# Patient Record
Sex: Male | Born: 1935 | Race: White | Hispanic: No | Marital: Married | Smoking: Never smoker
Health system: Southern US, Community
[De-identification: ages and names within clinical notes are randomized; demographics above are authoritative.]

## PROBLEM LIST (undated history)

## (undated) DIAGNOSIS — T7840XA Allergy, unspecified, initial encounter: Secondary | ICD-10-CM

## (undated) DIAGNOSIS — I10 Essential (primary) hypertension: Secondary | ICD-10-CM

## (undated) DIAGNOSIS — R972 Elevated prostate specific antigen [PSA]: Secondary | ICD-10-CM

## (undated) DIAGNOSIS — I639 Cerebral infarction, unspecified: Secondary | ICD-10-CM

## (undated) DIAGNOSIS — E785 Hyperlipidemia, unspecified: Secondary | ICD-10-CM

## (undated) DIAGNOSIS — N400 Enlarged prostate without lower urinary tract symptoms: Secondary | ICD-10-CM

## (undated) DIAGNOSIS — Z974 Presence of external hearing-aid: Secondary | ICD-10-CM

## (undated) DIAGNOSIS — M1990 Unspecified osteoarthritis, unspecified site: Secondary | ICD-10-CM

## (undated) HISTORY — PX: PROSTATE BIOPSY: SHX241

## (undated) HISTORY — DX: Allergy, unspecified, initial encounter: T78.40XA

## (undated) HISTORY — PX: TONSILLECTOMY: SUR1361

## (undated) HISTORY — DX: Essential (primary) hypertension: I10

## (undated) HISTORY — DX: Elevated prostate specific antigen (PSA): R97.20

## (undated) HISTORY — DX: Cerebral infarction, unspecified: I63.9

## (undated) HISTORY — DX: Hyperlipidemia, unspecified: E78.5

---

## 1998-04-25 ENCOUNTER — Ambulatory Visit (HOSPITAL_COMMUNITY): Admission: RE | Admit: 1998-04-25 | Discharge: 1998-04-25 | Payer: Self-pay | Admitting: Family Medicine

## 2002-06-20 ENCOUNTER — Ambulatory Visit (HOSPITAL_COMMUNITY): Admission: RE | Admit: 2002-06-20 | Discharge: 2002-06-20 | Payer: Self-pay | Admitting: *Deleted

## 2002-06-20 ENCOUNTER — Encounter (INDEPENDENT_AMBULATORY_CARE_PROVIDER_SITE_OTHER): Payer: Self-pay | Admitting: Specialist

## 2004-07-10 ENCOUNTER — Encounter: Admission: RE | Admit: 2004-07-10 | Discharge: 2004-07-10 | Payer: Self-pay | Admitting: Family Medicine

## 2008-11-02 LAB — CONVERTED CEMR LAB: PSA: 5.4 ng/mL

## 2008-11-12 ENCOUNTER — Encounter: Payer: Self-pay | Admitting: Family Medicine

## 2009-06-14 LAB — CONVERTED CEMR LAB: PSA: 5.8 ng/mL

## 2009-09-17 ENCOUNTER — Telehealth: Payer: Self-pay | Admitting: Family Medicine

## 2009-11-04 ENCOUNTER — Ambulatory Visit: Payer: Self-pay | Admitting: Family Medicine

## 2009-11-04 DIAGNOSIS — E785 Hyperlipidemia, unspecified: Secondary | ICD-10-CM | POA: Insufficient documentation

## 2009-11-04 DIAGNOSIS — E559 Vitamin D deficiency, unspecified: Secondary | ICD-10-CM | POA: Insufficient documentation

## 2009-11-04 DIAGNOSIS — J45909 Unspecified asthma, uncomplicated: Secondary | ICD-10-CM | POA: Insufficient documentation

## 2009-11-04 DIAGNOSIS — I10 Essential (primary) hypertension: Secondary | ICD-10-CM | POA: Insufficient documentation

## 2009-11-04 DIAGNOSIS — J309 Allergic rhinitis, unspecified: Secondary | ICD-10-CM | POA: Insufficient documentation

## 2009-11-05 ENCOUNTER — Ambulatory Visit: Payer: Self-pay | Admitting: Family Medicine

## 2009-11-05 LAB — CONVERTED CEMR LAB: Vit D, 25-Hydroxy: 42 ng/mL (ref 30–89)

## 2009-11-06 LAB — CONVERTED CEMR LAB
ALT: 25 units/L (ref 0–53)
AST: 23 units/L (ref 0–37)
Albumin: 4.4 g/dL (ref 3.5–5.2)
Alkaline Phosphatase: 51 units/L (ref 39–117)
BUN: 20 mg/dL (ref 6–23)
Bilirubin, Direct: 0.2 mg/dL (ref 0.0–0.3)
CO2: 28 meq/L (ref 19–32)
Calcium: 9.9 mg/dL (ref 8.4–10.5)
Chloride: 104 meq/L (ref 96–112)
Cholesterol: 168 mg/dL (ref 0–200)
Creatinine, Ser: 0.9 mg/dL (ref 0.4–1.5)
GFR calc non Af Amer: 86.82 mL/min (ref >60)
Glucose, Bld: 112 mg/dL — ABNORMAL HIGH (ref 70–99)
HDL: 68.2 mg/dL (ref >39.00)
LDL Cholesterol: 86 mg/dL (ref 0–99)
Potassium: 5 meq/L (ref 3.5–5.1)
Sodium: 143 meq/L (ref 135–145)
Total Bilirubin: 1.2 mg/dL (ref 0.3–1.2)
Total CHOL/HDL Ratio: 2
Total Protein: 7 g/dL (ref 6.0–8.3)
Triglycerides: 69 mg/dL (ref 0.0–149.0)
VLDL: 13.8 mg/dL (ref 0.0–40.0)

## 2010-04-30 ENCOUNTER — Ambulatory Visit: Payer: Self-pay | Admitting: Family Medicine

## 2010-04-30 DIAGNOSIS — J209 Acute bronchitis, unspecified: Secondary | ICD-10-CM | POA: Insufficient documentation

## 2010-06-04 ENCOUNTER — Encounter: Payer: Self-pay | Admitting: Family Medicine

## 2010-07-29 NOTE — Assessment & Plan Note (Signed)
Summary: NEW MEDICARE/[facility]   Vital Signs:  Patient profile:   76 year old male Height:      68 inches Weight:      166.50 pounds BMI:     25.41 Temp:     98.6 degrees F oral Pulse rate:   80 / minute Pulse rhythm:   regular BP sitting:   144 / 74  (left arm) Cuff size:   regular  Vitals Entered By: Delilah Shan CMA Duncan Dull) 2009/12/03 2:02 PM) CC: New Medicare Patient   History of Present Illness: 76 yo male here to establish care.    HTN- has been on Amlodipine 10 mg dan Quinipril 10 mg for years (was previously on lower dose of amlodipine).  Does get white coat HTN so brings in home BP log today.  BPs at home range 115-140/73-78.  No CP, SOB, blurred vision.  Gets occasional trace edema in his legs since increased dose of amlodipine but not bothersome.  HLD- has been on Simvastain 40 mg for years.  Last checked 10/2008- LDL 100, TG 131, HDL 59.  Normal LFTs.  No myalgias.  Elevated PSA- followed by Dr. Laverle Patter.  Has had two biopsies that were both neg.  PSA has been trending down over past year, last checked last  December, was 5.8.  Has another appt with him soon.  Allergic rhinitis/asthma- seasonal allergies.  Takes Claritin and Flonase.  During spring, takes Flovent and albuterol as needed.  Never has issues with wheezing or coughing when seasonal allergies are not bothering him.  Vit D deficiency- last May Vit D was 23.  Has been taking 1000 Units Vit D daily.    Preventive Screening-Counseling & Management  Alcohol-Tobacco     Smoking Status: never  Caffeine-Diet-Exercise     Does Patient Exercise: yes      Drug Use:  no.    Current Medications (verified): 1)  Proventil Hfa 108 (90 Base) Mcg/act Aers (Albuterol Sulfate) .... Inhale 2 Puffs Four Times A Day As Needed. 2)  Simvastatin 40 Mg Tabs (Simvastatin) .... Take 1 Tablet By Mouth Once A Day 3)  Amlodipine Besylate 10 Mg Tabs (Amlodipine Besylate) .... Take 1 Tablet By Mouth Once A Day 4)  Quinapril Hcl 10 Mg  Tabs (Quinapril Hcl) .... Take 1 Tablet By Mouth Once A Day 5)  Flonase 50 Mcg/act Susp (Fluticasone Propionate) .... (During Allergy Season).  Take 2 Sprays Each Nostril Once Daily 6)  Calcium Carbonate-Vitamin D 600-400 Mg-Unit  Tabs (Calcium Carbonate-Vitamin D) .Marland Kitchen.. 1200 Mg. Once Daily 7)  Vitamin D 1000 Unit  Tabs (Cholecalciferol) .... Take 1 Tablet By Mouth Once A Day 8)  Multivitamins   Tabs (Multiple Vitamin) .... Take 1 Tablet By Mouth Once A Day 9)  Fish Oil   Oil (Fish Oil) .Marland Kitchen.. 1000 Mg. Once Daily 10)  Aspirin 81 Mg  Tabs (Aspirin) .... Weekly 11)  Claritin 10 Mg Tabs (Loratadine) .... As Needed 12)  Pseudoephedrine Hcl 30 Mg Tabs (Pseudoephedrine Hcl) .... As Needed 13)  Benadryl 25 Mg Tabs (Diphenhydramine Hcl) .... When Claritin Doesn't Work 14)  Flovent Hfa 44 Mcg/act Aero (Fluticasone Propionate  Hfa) .... As Needed  Allergies (verified): 1)  ! Penicillin  Past History:  Family History: Last updated: 2009/12/03 Dad died at 39- massive MI mom had stroke later in life  Social History: Last updated: 03-Dec-2009 Lives in [place].   Retired as Theatre manager. Never Smoked Alcohol use-occassional Drug use-no Regular exercise-yes One  biological son, one adopted son, two grand daughter, 48 and 61 yo  Risk Factors: Exercise: yes (11/04/2009)  Risk Factors: Smoking Status: never (11/04/2009)  Past Medical History: Allergic rhinitis Asthma Hyperlipidemia Hypertension Elevated PSA s/p neg biopsy  Past Surgical History: Tonsillectomy Prostate biopsy  Family History: Dad died at 63- massive MI mom had stroke later in life  Social History: Lives in [place].   Retired as Theatre manager. Never Smoked Alcohol use-occassional Drug use-no Regular exercise-yes One biological son, one adopted son, two grand daughter, 47 and 57 yo Smoking Status:  never Drug Use:  no Does Patient Exercise:  yes  Review of Systems      See HPI General:  Denies  loss of appetite and malaise. Eyes:  Denies blurring. ENT:  Denies difficulty swallowing. CV:  Denies chest pain or discomfort. Resp:  Denies shortness of breath. GI:  Denies abdominal pain, bloody stools, and change in bowel habits. GU:  Denies dysuria, incontinence, nocturia, urinary frequency, and urinary hesitancy. MS:  Denies joint pain, joint redness, and joint swelling. Derm:  Denies rash. Neuro:  Denies headaches. Psych:  Denies anxiety and depression. Endo:  Denies cold intolerance, heat intolerance, polyuria, and weight change. Heme:  Denies abnormal bruising and bleeding. Allergy:  Complains of seasonal allergies.  Physical Exam  General:  Well-developed,well-nourished,in no acute distress; alert,appropriate and cooperative throughout examination Head:  Normocephalic and atraumatic without obvious abnormalities. No apparent alopecia or balding. Eyes:  vision grossly intact, pupils equal, pupils round, and pupils reactive to light.   Ears:  R ear normal and L ear normal.   Nose:  no external deformity.   Mouth:  MMM Lungs:  Normal respiratory effort, chest expands symmetrically. Lungs are clear to auscultation, no crackles or wheezes. Heart:  Normal rate and regular rhythm. S1 and S2 normal without gallop, murmur, click, rub or other extra sounds. Abdomen:  Bowel sounds positive,abdomen soft and non-tender without masses, organomegaly or hernias noted. Extremities:  no edema Skin:  seborrheic keratosis.   Psych:  memory intact for recent and remote and normally interactive.     Impression & Recommendations:  Problem # 1:  HYPERLIPIDEMIA (ICD-272.4) Assessment Unchanged Recheck fasting lipids and LFTs.  Continue Simvastatin at current dose. His updated medication list for this problem includes:    Simvastatin 40 Mg Tabs (Simvastatin) .Marland Kitchen... Take 1 tablet by mouth once a day  Problem # 2:  ALLERGIC RHINITIS (ICD-477.9) Assessment: Unchanged Continue current meds. His  updated medication list for this problem includes:    Flonase 50 Mcg/act Susp (Fluticasone propionate) ..... (during allergy season).  take 2 sprays each nostril once daily    Claritin 10 Mg Tabs (Loratadine) .Marland Kitchen... As needed    Pseudoephedrine Hcl 30 Mg Tabs (Pseudoephedrine hcl) .Marland Kitchen... As needed    Benadryl 25 Mg Tabs (Diphenhydramine hcl) .Marland Kitchen... When claritin doesn't work  Problem # 3:  HYPERTENSION (ICD-401.9) Assessment: Unchanged Stable.  Continue current meds. His updated medication list for this problem includes:    Amlodipine Besylate 10 Mg Tabs (Amlodipine besylate) .Marland Kitchen... Take 1 tablet by mouth once a day    Quinapril Hcl 10 Mg Tabs (Quinapril hcl) .Marland Kitchen... Take 1 tablet by mouth once a day  Orders: Prescription Created Electronically [phone])  Problem # 4:  VITAMIN D DEFICIENCY (ICD-268.9) Assessment: Unchanged Recheck Vit D level.  Continue 1000 units daily.  Complete Medication List: 1)  Proventil Hfa 108 (90 Base) Mcg/act Aers (Albuterol sulfate) .... Inhale 2 puffs four times a day  as needed. 2)  Simvastatin 40 Mg Tabs (Simvastatin) .... Take 1 tablet by mouth once a day 3)  Amlodipine Besylate 10 Mg Tabs (Amlodipine besylate) .... Take 1 tablet by mouth once a day 4)  Quinapril Hcl 10 Mg Tabs (Quinapril hcl) .... Take 1 tablet by mouth once a day 5)  Flonase 50 Mcg/act Susp (Fluticasone propionate) .... (during allergy season).  take 2 sprays each nostril once daily 6)  Calcium Carbonate-vitamin D 600-400 Mg-unit Tabs (Calcium carbonate-vitamin d) .Marland Kitchen.. 1200 mg. once daily 7)  Vitamin D 1000 Unit Tabs (Cholecalciferol) .... Take 1 tablet by mouth once a day 8)  Multivitamins Tabs (Multiple vitamin) .... Take 1 tablet by mouth once a day 9)  Fish Oil Oil (Fish oil) .Marland Kitchen.. 1000 mg. once daily 10)  Aspirin 81 Mg Tabs (Aspirin) .... Weekly 11)  Claritin 10 Mg Tabs (Loratadine) .... As needed 12)  Pseudoephedrine Hcl 30 Mg Tabs (Pseudoephedrine hcl) .... As needed 13)  Benadryl 25  Mg Tabs (Diphenhydramine hcl) .... When claritin doesn't work 14)  Flovent Hfa 44 Mcg/act Aero (Fluticasone propionate  hfa) .... As needed  Patient Instructions: 1)  Great to meet you, Mr. Solly 2)  Please make an appointment for a lab visit tomorrow morning, BMET, Vit D (268.9), Fasting lipid, liver function (272.4). Prescriptions: SIMVASTATIN 40 MG TABS (SIMVASTATIN) Take 1 tablet by mouth once a day  #90 x 6   Entered and Authorized by:   Ruthe Mannan MD   Signed by:   Ruthe Mannan MD on 11/04/2009   Method used:   [facility]        [facility] (retail)       [address]       [place], [place]  [address]       Ph: [phone] or [phone]       Fax: [phone]   RxID:   [id number] QUINAPRIL HCL 10 MG TABS (QUINAPRIL HCL) Take 1 tablet by mouth once a day  #90 x 6   Entered and Authorized by:   Ruthe Mannan MD   Signed by:   Ruthe Mannan MD on 11/04/2009   Method used:   [facility]        [facility] (retail)       [address]       [place], [place]  [address]       Ph: [phone] or [phone]       Fax: [phone]   RxID:   [id number] AMLODIPINE BESYLATE 10 MG TABS (AMLODIPINE BESYLATE) Take 1 tablet by mouth once a day  #90 x 6   Entered and Authorized by:   Ruthe Mannan MD   Signed by:   Ruthe Mannan MD on 11/04/2009   Method used:   [facility]        [facility] (retail)       [address]       [place], [place]  [address]       Ph: [phone] or [phone]       Fax: [phone]   RxID:   [id number]   Current Allergies (reviewed today): ! PENICILLIN  TD Result Date:  10/31/2008 TD Result:  historical Pneumovax Result Date:  09/07/2007 Pneumovax Result:  historical Herpes Zoster Result Date:  09/07/2007 Herpes Zoster Result:  historical HDL Result Date:  11/02/2008 HDL Next Due:  1 yr LDL Result Date:  11/02/2008 LDL Next Due:  1 yr Flex  Sig Next Due:  Not  Indicated Colonoscopy Result Date:  11/10/2002 Colonoscopy Result:  normal Colonoscopy Next Due:  10 yr Hemoccult Next Due:  Not Indicated PSA Result Date:  06/14/2009 PSA Result:  5.8 PSA Next Due:  1 yr

## 2010-07-29 NOTE — Assessment & Plan Note (Signed)
Summary: ASTHMA/[facility]   Vital Signs:  Patient profile:   76 year old male Weight:      170.25 pounds O2 Sat:      98 % on Room air Temp:     97.7 degrees F oral Pulse rate:   76 / minute Pulse rhythm:   regular BP sitting:   160 / 90  (left arm) Cuff size:   regular  Vitals Entered By: Selena Batten Dance CMA Duncan Dull) (April 30, 2010 9:34 AM)  O2 Flow:  Room air CC: Asthma/cough   History of Present Illness: CC: bronchitis/asthma x 3 wks  seasonal allergies cause bronchitis which triggers asthma.  2nd time this year.  controls allergies with claritin and flonase.  Didn't help this time.  For last 3 wks cough productive of clear mucous worse at night, trouble sleeping.  using albuterol.  has had flovent but hasn't been using.  + congestion, RN, sneezing, itchy eyes.  asthma only problem during allergy season.  No fevers/chills, ST, abd pain, na/v, rashes, muscle pains.  no recent travel outside of city.  h/o asthma x 3 years.  was runner.  never smoked.  o/w healthy.  Last spring did prednisone taper which helped as well.  Also has tussionex pennkinetic but wanted to check first to make sure ok to take.  was in [employer], had PFTs years ago and always told had good lung function.  asthma has only started being problem in last 3 years.  Current Medications (verified): 1)  Proventil Hfa 108 (90 Base) Mcg/act Aers (Albuterol Sulfate) .... Inhale 2 Puffs Four Times A Day As Needed. 2)  Simvastatin 40 Mg Tabs (Simvastatin) .... Take 1 Tablet By Mouth Once A Day 3)  Amlodipine Besylate 10 Mg Tabs (Amlodipine Besylate) .... Take 1 Tablet By Mouth Once A Day 4)  Quinapril Hcl 10 Mg Tabs (Quinapril Hcl) .... Take 1 Tablet By Mouth Once A Day 5)  Flonase 50 Mcg/act Susp (Fluticasone Propionate) .... (During Allergy Season).  Take 2 Sprays Each Nostril Once Daily 6)  Calcium Carbonate-Vitamin D 600-400 Mg-Unit  Tabs (Calcium Carbonate-Vitamin D) .Marland Kitchen.. 1200 Mg. Once Daily 7)  Vitamin D 1000 Unit   Tabs (Cholecalciferol) .... Take 1 Tablet By Mouth Once A Day 8)  Multivitamins   Tabs (Multiple Vitamin) .... Take 1 Tablet By Mouth Once A Day 9)  Fish Oil   Oil (Fish Oil) .Marland Kitchen.. 1000 Mg. Once Daily 10)  Aspirin 81 Mg  Tabs (Aspirin) .... Weekly 11)  Claritin 10 Mg Tabs (Loratadine) .... As Needed 12)  Pseudoephedrine Hcl 30 Mg Tabs (Pseudoephedrine Hcl) .... As Needed 13)  Benadryl 25 Mg Tabs (Diphenhydramine Hcl) .... When Claritin Doesn't Work 14)  Flovent Hfa 44 Mcg/act Aero (Fluticasone Propionate  Hfa) .... As Needed  Allergies: 1)  ! Penicillin  Past History:  Past Medical History: Last updated: 11/04/2009 Allergic rhinitis Asthma Hyperlipidemia Hypertension Elevated PSA s/p neg biopsy  Social History: Last updated: 11/04/2009 Lives in [place].   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  + episodes of coughing that takes breath away. Head:  Normocephalic and atraumatic without obvious abnormalities. No apparent alopecia or balding. Eyes:  vision grossly intact, pupils equal, pupils round, and pupils  reactive to light.   Ears:  R ear normal and L ear normal.   Nose:  no external deformity.   Mouth:  MMM Neck:  No deformities, masses, or tenderness noted. Lungs:  overall clear lungs, good air movement.  + wheezing but mainly expiratory with cough, sounds more upper airway transmission of cough.  good O2 sat. Heart:  Normal rate and regular rhythm. S1 and S2 normal without gallop, murmur, click, rub or other extra sounds. Pulses:  2+ rad pulses Extremities:  no edema   Impression & Recommendations:  Problem # 1:  ACUTE BRONCHITIS (ICD-466.0)  treat as asthmatic  bronchitis with zpack and steroids.  red flags to return discussed.  start flovent regularly after predinsone course.  His updated medication list for this problem includes:    Proventil Hfa 108 (90 Base) Mcg/act Aers (Albuterol sulfate) ..... Inhale 2 puffs four times a day as needed.    Flovent Hfa 44 Mcg/act Aero (Fluticasone propionate  hfa) .Marland Kitchen... As needed    Zithromax Z-pak 250 Mg Tabs (Azithromycin) .Marland Kitchen... Take as directed  Orders: Prescription Created Electronically [id number])  Complete Medication List: 1)  Proventil Hfa 108 (90 Base) Mcg/act Aers (Albuterol sulfate) .... Inhale 2 puffs four times a day as needed. 2)  Simvastatin 40 Mg Tabs (Simvastatin) .... Take 1 tablet by mouth once a day 3)  Amlodipine Besylate 10 Mg Tabs (Amlodipine besylate) .... Take 1 tablet by mouth once a day 4)  Quinapril Hcl 10 Mg Tabs (Quinapril hcl) .... Take 1 tablet by mouth once a day 5)  Flonase 50 Mcg/act Susp (Fluticasone propionate) .... (during allergy season).  take 2 sprays each nostril once daily 6)  Calcium Carbonate-vitamin D 600-400 Mg-unit Tabs (Calcium carbonate-vitamin d) .Marland Kitchen.. 1200 mg. once daily 7)  Vitamin D 1000 Unit Tabs (Cholecalciferol) .... Take 1 tablet by mouth once a day 8)  Multivitamins Tabs (Multiple vitamin) .... Take 1 tablet by mouth once a day 9)  Fish Oil Oil (Fish oil) .Marland Kitchen.. 1000 mg. once daily 10)  Aspirin 81 Mg Tabs (Aspirin) .... Weekly 11)  Claritin 10 Mg Tabs (Loratadine) .... As needed 12)  Pseudoephedrine Hcl 30 Mg Tabs (Pseudoephedrine hcl) .... As needed 13)  Benadryl 25 Mg Tabs (Diphenhydramine hcl) .... When claritin doesn't work 14)  Flovent Hfa 44 Mcg/act Aero (Fluticasone propionate  hfa) .... As needed 15)  Zithromax Z-pak 250 Mg Tabs (Azithromycin) .... Take as directed 16)  Prednisone 20 Mg Tabs (Prednisone) .... 2 pills for 7 days  Patient Instructions: 1)  Sounds like you have a touch of asthmatic bronchitis. 2)  Take zpack, steroid course.  Start  flovent after steroids. 3)  Push fluids and plenty of rest. 4)  Let us know if not improving as expected. 5)  Please return if you are not improving as expected, or if you have high fevers (>101.5) or difficulty swallowing. 6)  Call clinic with questions.  Pleasure to see you today!  Prescriptions: FLOVENT HFA 44 MCG/ACT AERO (FLUTICASONE PROPIONATE  HFA) as needed  #1 x 3   Entered and Authorized by:   Eustaquio Boyden  MD   Signed by:   Eustaquio Boyden  MD on 04/30/2010   Method used:   [facility]        [facility] (retail)       [address]       [place], [place]  [address]       Ph: [phone] or [phone]  Fax: [phone]   RxID:   [id number] PREDNISONE 20 MG TABS (PREDNISONE) 2 pills for 7 days  #14 x 0   Entered and Authorized by:   Eustaquio Boyden  MD   Signed by:   Eustaquio Boyden  MD on 04/30/2010   Method used:   [facility]        [facility] ([facility]       [address]       [place], [place]  [address]       Ph: [phone] or [phone]       Fax: [phone]   RxID:   [id number] ZITHROMAX Z-PAK 250 MG TABS (AZITHROMYCIN) take as directed  #1 x 0   Entered and Authorized by:   Eustaquio Boyden  MD   Signed by:   Eustaquio Boyden  MD on 04/30/2010   Method used:   [facility]        [id number]       [address]       [place], [place]  [address]       Ph: [phone] or [phone]       Fax: [phone]   RxID:   [id number]    Orders Added: 1)  Est. Patient Level III [[address]] 2)  Prescription Created Electronically [phone]    Current Allergies (reviewed today): ! PENICILLIN

## 2010-07-29 NOTE — Progress Notes (Signed)
Summary: pt requests proventil  Phone Note Call from Patient   Caller: Patient Summary of Call: Pt walked in, he has a new pt appt with Dr. Dayton Martes in May.  States he had an asthma attack last night, barely had enough proventil to get him through it.  His previous doctor has closed his practice and he cant get any refills, asks if we can refill.  Per Dr. Dayton Martes, ok to refill.  Called in one inhaler with [facility] ref[facility].  Med added to med list. Initial call taken by: Lowella Petties CMA,  September 17, 2009 9:01 AM    New/Updated Medications: PROVENTIL HFA 108 (90 BASE) MCG/ACT AERS (ALBUTEROL SULFATE) inhale 2 puffs four times a day as needed. Prescriptions: PROVENTIL HFA 108 (90 BASE) MCG/ACT AERS (ALBUTEROL SULFATE) inhale 2 puffs four times a day as needed.  #1 x 1   Entered by:   Lowella Petties CMA   Authorized by:   Ruthe Mannan MD   Signed by:   Lowella Petties CMA on 09/17/2009   Method used:   Telephoned to ...       [facility] [facility]ail[facility]       [address]       [place], [place]  [address]       Ph: [phone] or [phone]       Fax: [phone]   RxID:   [id number]   Prior Medications:

## 2010-07-29 NOTE — Letter (Signed)
Summary: Dr.James Kindl's Records  Dr[facility]James Kindl[facility] Records   Imported By: Beau Fanny 11/05/2009 08:44:05  _____________________________________________________________________  External Attachment:    Type:   Image     Comment:   External Document

## 2010-07-31 NOTE — Letter (Signed)
Summary: [facility]  [facility]   Imported By: Lanelle Bal 06/13/2010 08:28:08  _____________________________________________________________________  External Attachment:    Type:   Image     Comment:   External Document  Appended Document: [facility] elevated, stable PSA.  annual follow up.

## 2010-11-14 NOTE — Op Note (Signed)
   NAMEJENTZEN, MINASYAN NO.:  [id number]   MEDICAL RECORD NO.:  [id number]                   PATIENT TYPE:  AMB   LOCATION:  ENDO                                 FACILITY:  [facility]   PHYSICIAN:  Georgiana Spinner, M.D.                 DATE OF BIRTH:  06/08/1935   DATE OF PROCEDURE:  06/20/2002  DATE OF DISCHARGE:                                 OPERATIVE REPORT   PROCEDURE:  Upper endoscopy with biopsy.   INDICATIONS FOR PROCEDURE:  Hemoccult positivity.   ANESTHESIA:  Demerol 70, Versed 7 mg.   PROCEDURE:  With the patient mildly sedated in the left lateral decubitus  position, the Olympus videoscopic endoscope was inserted in the mouth and  passed under direct vision through the esophagus which appeared normal, into  the stomach.  In the fundus of the stomach, there was a long superficial  linear ulcer presumably NSAID induced which we photographed and subsequently  biopsied.  We advanced to the body, antrum, duodenal bulb, and second  portion of the duodenum all appeared normal.  From this point, the endoscope  was slowly withdrawn taking several views of the duodenal mucosa until the  endoscope was pulled back into the stomach and placed in retroflexion to  view the stomach from below.  Slight laxity of the GE junction on the  endoscope was noted and photographed in one view.  The endoscope was then  straightened taking views of the remaining gastric and esophageal mucosa.  The patient's vital signs, pulse oximeter remained stable.  The patient  tolerated the procedure well without apparent complications.   FINDINGS:  Linear ulcer in fundus of stomach presumably NSAID induced.   PLAN:  Will discuss with patient either switching from his OTC NSAIDs, of  staying on his regular dose of aspirin, or alternatively adding a PPI for  gastric coverage.  Proceed with colonoscopy as planned.                                               Georgiana Spinner,  M.D.    GMO/MEDQ  D:  06/20/2002  T:  06/20/2002  Job:  [id number]   cc:   Quita Skye. Artis Flock, M.D.  [address]  [place]  [place] [address]  Fax: [phone]

## 2010-11-14 NOTE — Op Note (Signed)
   NAMETARVIS, [name]:  [id number]   MEDICAL RECORD NO.:  [id number]                   PATIENT TYPE:  AMB   LOCATION:  ENDO                                 FACILITY:  [facility]   PHYSICIAN:  Georgiana Spinner, M.D.                 DATE OF BIRTH:  [birth year]   DATE OF PROCEDURE:  06/20/2002  DATE OF DISCHARGE:                                 OPERATIVE REPORT   PROCEDURE:  Colonoscopy.   INDICATIONS FOR PROCEDURE:  Hemoccult positivity.   ANESTHESIA:  Demerol 30, Versed 3 mg.   PROCEDURE:  With the patient mildly sedated in the left lateral decubitus  position, a rectal exam was performed which was unremarkable.  Subsequently,  the Olympus videoscopic colonoscope was inserted in the rectum and passed  under direct vision to the cecum identified by the ileocecal valve and  appendiceal orifice, both of which were photographed.  Going to the very tip  of the terminal ileum which appeared normal.  From this point, the  colonoscope was slowly withdrawn taking views of the entire colonic mucosa  stopping only in the rectum which appeared normal on direct and retroflex  view.  The endoscope was withdrawn.  The patient's vital signs, pulse  oximeter remained stable.  The patient tolerated the procedure well without  apparent complications.   FINDINGS:  Unremarkable colonoscopic examination to the cecum.   PLAN:  See endoscopy note for further details.                                               Georgiana Spinner, M.D.    GMO/MEDQ  D:  06/20/2002  T:  06/20/2002  Job:  [id number]   cc:   Quita Skye. Artis Flock, M.D.  [address]  [place]  [place] [address]  Fax: [phone]

## 2010-11-14 NOTE — Op Note (Signed)
   [name], Elijah NO.:  [id number]   MEDICAL RECORD NO.:  [id number]                   PATIENT TYPE:  AMB   LOCATION:  ENDO                                 FACILITY:  [facility]   PHYSICIAN:  Georgiana Spinner, M.D.                 DATE OF BIRTH:  01-04-1935   DATE OF PROCEDURE:  06/20/2002  DATE OF DISCHARGE:                                 OPERATIVE REPORT   PROCEDURE:  Upper endoscopy with biopsy.   INDICATIONS FOR PROCEDURE:  Hemoccult positivity.   ANESTHESIA:  Demerol 70, Versed 7 mg.   PROCEDURE:  With the patient mildly sedated in the left lateral decubitus  position, the Olympus videoscopic endoscope was inserted in the mouth and  passed under direct vision through the esophagus which appeared normal, into  the stomach.  In the fundus of the stomach, there was a long superficial  linear ulcer, presumably NSAID induced, which we photographed and  subsequently biopsied.  We advanced to the body, antrum, duodenal bulb, and  second portion of the duodenum which all appeared normal.  From this point,  the endoscope was slowly withdrawn taking several views of the duodenal  mucosa until the endoscope was pulled back into the stomach and placed in  retroflexion to view the stomach from below and slight laxity of the GE  junction on the endoscope was noted and photographed in one view.  The  endoscope was straightened and withdrawn, taking several views of the  remaining gastric and esophageal mucosa.  The patient's vital signs, pulse  oximeter remained stable.  The patient tolerated the procedure well without  apparent complications.   FINDINGS:  Linear ulcer in the fundus of the stomach presumably NSAID  induced.   PLAN:  Will discuss with the patient either switching from his OTC NSAIDs,  staying on his regular dose of aspirin, alternatively adding a PPI for  gastric coverage.  Proceed to colonoscopy as planned.                     Georgiana Spinner, M.D.    GMO/MEDQ  D:  06/20/2002  T:  06/20/2002  Job:  [id number]   cc:   Quita Skye. Artis Flock, M.D.  [address], [address]  [place]  [place] [address]  Fax: [phone]

## 2010-12-12 ENCOUNTER — Other Ambulatory Visit: Payer: Self-pay | Admitting: Family Medicine

## 2011-01-13 ENCOUNTER — Other Ambulatory Visit: Payer: Self-pay | Admitting: Family Medicine

## 2011-01-13 DIAGNOSIS — E785 Hyperlipidemia, unspecified: Secondary | ICD-10-CM

## 2011-01-13 DIAGNOSIS — E559 Vitamin D deficiency, unspecified: Secondary | ICD-10-CM

## 2011-01-13 DIAGNOSIS — I10 Essential (primary) hypertension: Secondary | ICD-10-CM

## 2011-01-14 ENCOUNTER — Other Ambulatory Visit: Payer: Self-pay | Admitting: *Deleted

## 2011-01-14 MED ORDER — SIMVASTATIN 40 MG PO TABS: 40.0000 mg | ORAL_TABLET | Freq: Every day | ORAL | Status: DC

## 2011-01-14 MED ORDER — QUINAPRIL HCL 10 MG PO TABS: 10.0000 mg | ORAL_TABLET | Freq: Every day | ORAL | Status: DC

## 2011-01-15 ENCOUNTER — Other Ambulatory Visit (INDEPENDENT_AMBULATORY_CARE_PROVIDER_SITE_OTHER): Payer: Medicare Other | Admitting: Family Medicine

## 2011-01-15 ENCOUNTER — Encounter: Payer: Self-pay | Admitting: Family Medicine

## 2011-01-15 DIAGNOSIS — I10 Essential (primary) hypertension: Secondary | ICD-10-CM

## 2011-01-15 DIAGNOSIS — E785 Hyperlipidemia, unspecified: Secondary | ICD-10-CM

## 2011-01-15 LAB — LIPID PANEL
Cholesterol: 159 mg/dL (ref 0–200)
HDL: 66.9 mg/dL (ref >39.00)
LDL Cholesterol: 78 mg/dL (ref 0–99)
Total CHOL/HDL Ratio: 2
Triglycerides: 73 mg/dL (ref 0.0–149.0)
VLDL: 14.6 mg/dL (ref 0.0–40.0)

## 2011-01-15 LAB — BASIC METABOLIC PANEL
BUN: 15 mg/dL (ref 6–23)
CO2: 25 mEq/L (ref 19–32)
Calcium: 9.6 mg/dL (ref 8.4–10.5)
Chloride: 107 mEq/L (ref 96–112)
Creatinine, Ser: 1 mg/dL (ref 0.4–1.5)
GFR: 80.39 mL/min (ref >60.00)
Glucose, Bld: 121 mg/dL — ABNORMAL HIGH (ref 70–99)
Potassium: 4.6 mEq/L (ref 3.5–5.1)
Sodium: 142 mEq/L (ref 135–145)

## 2011-01-15 LAB — HM COLONOSCOPY

## 2011-01-20 ENCOUNTER — Ambulatory Visit (INDEPENDENT_AMBULATORY_CARE_PROVIDER_SITE_OTHER): Payer: Medicare Other | Admitting: Family Medicine

## 2011-01-20 ENCOUNTER — Encounter: Payer: Self-pay | Admitting: Family Medicine

## 2011-01-20 VITALS — BP 150/100 | HR 79 | Temp 98.0°F | Ht 66.25 in | Wt 166.5 lb

## 2011-01-20 DIAGNOSIS — R739 Hyperglycemia, unspecified: Secondary | ICD-10-CM

## 2011-01-20 DIAGNOSIS — Z0000 Encounter for general adult medical examination without abnormal findings: Secondary | ICD-10-CM | POA: Insufficient documentation

## 2011-01-20 DIAGNOSIS — J309 Allergic rhinitis, unspecified: Secondary | ICD-10-CM

## 2011-01-20 DIAGNOSIS — R7309 Other abnormal glucose: Secondary | ICD-10-CM

## 2011-01-20 DIAGNOSIS — E785 Hyperlipidemia, unspecified: Secondary | ICD-10-CM

## 2011-01-20 DIAGNOSIS — J45909 Unspecified asthma, uncomplicated: Secondary | ICD-10-CM

## 2011-01-20 LAB — HEMOGLOBIN A1C: Hgb A1c MFr Bld: 5.5 % (ref 4.6–6.5)

## 2011-01-20 MED ORDER — SIMVASTATIN 40 MG PO TABS: ORAL_TABLET | ORAL | Status: DC

## 2011-01-20 NOTE — Patient Instructions (Signed)
Great to see you. Let's cut your simvastatin in half.  Try cutting back on starches. Have a great time at the beach.

## 2011-01-20 NOTE — Progress Notes (Signed)
76 [name] here for medicare annual wellness visit.  I have personally reviewed the Medicare Annual Wellness questionnaire and have noted 1. The patient's medical and social history 2. Their use of alcohol, tobacco or illicit drugs 3. Their current medications and supplements 4. The patient's functional ability including ADL's, fall risks, home safety risks and hearing or visual             impairment. 5. Diet and physical activities 6. Evidence for depression or mood disorders  Hyperglycemia-  CBG elevated to 121 this month.  Pt was fasting. Denies any family h/o DM.   Denies any increased thirst or urination. Does admit to eating a lot of starches- loves breads and pastas.  HTN- has been on Amlodipine 10 mg dan Quinipril 10 mg for years (was previously on lower dose of amlodipine).  Does get white coat HTN so brings in home BP log today.  BPs at home range 117-144/73-78.  No CP, SOB, blurred vision.  Gets occasional trace edema in his legs since increased dose of amlodipine but not bothersome.  HLD- has been on Simvastain 40 mg for years.   Lab Results  Component Value Date   CHOL 159 01/15/2011   CHOL 168 11/05/2009   Lab Results  Component Value Date   HDL 66.90 01/15/2011   HDL 16.10 11/05/2009   Lab Results  Component Value Date   LDLCALC 78 01/15/2011   LDLCALC 86 11/05/2009   Lab Results  Component Value Date   TRIG 73.0 01/15/2011   TRIG 69.0 11/05/2009    Elevated PSA- followed by Dr. Laverle Patter.  Has had two biopsies that were both neg.  PSA has been trending down over past year, last checked last  December, was 5.8.  Has another appt with him soon.  . Patient Active Problem List  Diagnoses  . VITAMIN D DEFICIENCY  . HYPERLIPIDEMIA  . HYPERTENSION  . ACUTE BRONCHITIS  . ALLERGIC RHINITIS  . ASTHMA  . Routine general medical examination at a health care facility   Past Medical History  Diagnosis Date  . Allergy   . Asthma   . Hyperlipidemia   . Hypertension   .  Elevated PSA     s/p neg biopsy   Past Surgical History  Procedure Date  . Tonsillectomy   . Prostate biopsy    History  Substance Use Topics  . Smoking status: Never Smoker   . Smokeless tobacco: Not on file  . Alcohol Use: Yes   Family History  Problem Relation Age of Onset  . Stroke Mother    Allergies  Allergen Reactions  . Penicillins    Current Outpatient Prescriptions on File Prior to Visit  Medication Sig Dispense Refill  . albuterol (PROVENTIL HFA) 108 (90 BASE) MCG/ACT inhaler Inhale 2 puffs into the lungs every 4 (four) hours as needed.        Marland Kitchen amLODipine (NORVASC) 10 MG tablet TAKE 1 TABLET BY MOUTH ONCE A DAY  90 tablet  3  . aspirin 81 MG tablet Take 81 mg by mouth daily.        . Calcium Carbonate-Vitamin D (TH CALCIUM CARBONATE-VITAMIN D) 600-400 MG-UNIT per tablet Take 1 tablet by mouth daily.        . cholecalciferol (VITAMIN D) 1000 UNITS tablet Take 1,000 Units by mouth daily.        . diphenhydrAMINE (BENADRYL) 25 MG tablet Take 25 mg by mouth as needed.        Marland Kitchen  fluticasone (FLONASE) 50 MCG/ACT nasal spray Place 2 sprays into the nose daily.        . fluticasone (FLOVENT HFA) 44 MCG/ACT inhaler Inhale 1 puff into the lungs as needed.        . loratadine (CLARITIN) 10 MG tablet Take 10 mg by mouth daily.        . Multiple Vitamin (MULTIVITAMIN) tablet Take 1 tablet by mouth daily.        . Omega-3 Fatty Acids (FISH OIL) 1000 MG CAPS Take 1 capsule by mouth daily.        . pseudoephedrine (SUDAFED) 30 MG tablet Take 30 mg by mouth as needed.        . quinapril (ACCUPRIL) 10 MG tablet Take 1 tablet (10 mg total) by mouth daily.  90 tablet  3  . simvastatin (ZOCOR) 40 MG tablet Take 1 tablet (40 mg total) by mouth at bedtime.  90 tablet  3   The PMH, PSH, Social History, Family History, Medications, and allergies have been reviewed in [facility], and have been updated if relevant.   Review of Systems       See HPI General:  Denies loss of appetite and  malaise. Eyes:  Denies blurring. ENT:  Denies difficulty swallowing. CV:  Denies chest pain or discomfort. Resp:  Denies shortness of breath. GI:  Denies abdominal pain, bloody stools, and change in bowel habits. GU:  Denies dysuria, incontinence, nocturia, urinary frequency, and urinary hesitancy. MS:  Denies joint pain, joint redness, and joint swelling. Derm:  Denies rash. Neuro:  Denies headaches. Psych:  Denies anxiety and depression. Endo:  Denies cold intolerance, heat intolerance, polyuria, and weight change. Heme:  Denies abnormal bruising and bleeding.   Physical Exam BP 150/100  Pulse 79  Temp(Src) 98 F (36.7 C) (Oral)  Ht 5' 6.25" (1.683 m)  Wt 166 lb 8 oz (75.524 kg)  BMI 26.Elijah kg/m2  General:  Well-developed,well-nourished,in no acute distress; alert,appropriate and cooperative throughout examination Head:  Normocephalic and atraumatic without obvious abnormalities. No apparent alopecia or balding. Eyes:  vision grossly intact, pupils equal, pupils round, and pupils reactive to light.   Ears:  R ear normal and L ear normal.   Nose:  no external deformity.   Mouth:  MMM Lungs:  Normal respiratory effort, chest expands symmetrically. Lungs are clear to auscultation, no crackles or wheezes. Heart:  Normal rate and regular rhythm. S1 and S2 normal without gallop, murmur, click, rub or other extra sounds. Abdomen:  Bowel sounds positive,abdomen soft and non-tender without masses, organomegaly or hernias noted. Extremities:  no edema Skin:  seborrheic keratosis.   Psych:  memory intact for recent and remote and normally interactive.    Assessment and Plan: 1. Routine general medical examination at a health care facility  The patients weight, height, BMI and visual acuity have been recorded in the chart I have made referrals, counseling and provided education to the patient based review of the above and I have provided the pt with a written personalized care plan for  preventive services.    2. HYPERLIPIDEMIA  Improved.  Will decrease Simvastatin dose to 20 mg daily as he is taking amlodipine as well and lipid panel is excellent. The patient indicates understanding of these issues and agrees with the plan.    3. ASTHMA  Stable.   4. Hyperglycemia  HgB A1c   New.  Will check a1c today. Also counseled on diet- given copy of eat right diet. Will likely  recheck fasting CBGs in 3-6 months based on a1c results.

## 2011-01-29 ENCOUNTER — Other Ambulatory Visit: Payer: Medicare Other

## 2011-01-29 ENCOUNTER — Encounter: Payer: Self-pay | Admitting: *Deleted

## 2011-01-29 ENCOUNTER — Other Ambulatory Visit: Payer: Self-pay | Admitting: Family Medicine

## 2011-01-29 DIAGNOSIS — Z1211 Encounter for screening for malignant neoplasm of colon: Secondary | ICD-10-CM

## 2011-01-29 LAB — FECAL OCCULT BLOOD, IMMUNOCHEMICAL: Fecal Occult Bld: NEGATIVE

## 2011-03-26 ENCOUNTER — Other Ambulatory Visit: Payer: Self-pay | Admitting: Family Medicine

## 2011-07-10 DIAGNOSIS — R972 Elevated prostate specific antigen [PSA]: Secondary | ICD-10-CM | POA: Diagnosis not present

## 2011-07-15 DIAGNOSIS — N402 Nodular prostate without lower urinary tract symptoms: Secondary | ICD-10-CM | POA: Diagnosis not present

## 2011-07-15 DIAGNOSIS — R972 Elevated prostate specific antigen [PSA]: Secondary | ICD-10-CM | POA: Diagnosis not present

## 2011-08-26 ENCOUNTER — Other Ambulatory Visit: Payer: Self-pay | Admitting: Family Medicine

## 2011-08-26 NOTE — Telephone Encounter (Signed)
Will route to PCP 

## 2011-11-20 ENCOUNTER — Other Ambulatory Visit: Payer: Self-pay | Admitting: Family Medicine

## 2012-01-01 ENCOUNTER — Other Ambulatory Visit: Payer: Self-pay | Admitting: Family Medicine

## 2012-01-19 ENCOUNTER — Other Ambulatory Visit: Payer: Self-pay | Admitting: Family Medicine

## 2012-01-19 DIAGNOSIS — E785 Hyperlipidemia, unspecified: Secondary | ICD-10-CM

## 2012-01-19 DIAGNOSIS — Z125 Encounter for screening for malignant neoplasm of prostate: Secondary | ICD-10-CM

## 2012-01-19 DIAGNOSIS — I10 Essential (primary) hypertension: Secondary | ICD-10-CM

## 2012-01-25 ENCOUNTER — Other Ambulatory Visit (INDEPENDENT_AMBULATORY_CARE_PROVIDER_SITE_OTHER): Payer: Medicare Other

## 2012-01-25 DIAGNOSIS — E785 Hyperlipidemia, unspecified: Secondary | ICD-10-CM

## 2012-01-25 DIAGNOSIS — I10 Essential (primary) hypertension: Secondary | ICD-10-CM

## 2012-01-25 DIAGNOSIS — Z125 Encounter for screening for malignant neoplasm of prostate: Secondary | ICD-10-CM

## 2012-01-25 LAB — LIPID PANEL
Cholesterol: 179 mg/dL (ref 0–200)
HDL: 65.9 mg/dL (ref >39.00)
LDL Cholesterol: 92 mg/dL (ref 0–99)
Total CHOL/HDL Ratio: 3
Triglycerides: 106 mg/dL (ref 0.0–149.0)
VLDL: 21.2 mg/dL (ref 0.0–40.0)

## 2012-01-25 LAB — COMPREHENSIVE METABOLIC PANEL
ALT: 18 U/L (ref 0–53)
AST: 21 U/L (ref 0–37)
Albumin: 4.3 g/dL (ref 3.5–5.2)
Alkaline Phosphatase: 57 U/L (ref 39–117)
BUN: 17 mg/dL (ref 6–23)
CO2: 25 mEq/L (ref 19–32)
Calcium: 9.8 mg/dL (ref 8.4–10.5)
Chloride: 104 mEq/L (ref 96–112)
Creatinine, Ser: 0.9 mg/dL (ref 0.4–1.5)
GFR: 85.21 mL/min (ref >60.00)
Glucose, Bld: 112 mg/dL — ABNORMAL HIGH (ref 70–99)
Potassium: 4.6 mEq/L (ref 3.5–5.1)
Sodium: 136 mEq/L (ref 135–145)
Total Bilirubin: 1.6 mg/dL — ABNORMAL HIGH (ref 0.3–1.2)
Total Protein: 7.2 g/dL (ref 6.0–8.3)

## 2012-01-25 LAB — PSA: PSA: 8.48 ng/mL — ABNORMAL HIGH (ref 0.10–4.00)

## 2012-01-28 ENCOUNTER — Ambulatory Visit (INDEPENDENT_AMBULATORY_CARE_PROVIDER_SITE_OTHER): Payer: Medicare Other | Admitting: Family Medicine

## 2012-01-28 ENCOUNTER — Ambulatory Visit (INDEPENDENT_AMBULATORY_CARE_PROVIDER_SITE_OTHER)
Admission: RE | Admit: 2012-01-28 | Discharge: 2012-01-28 | Disposition: A | Discharge status: Home / Self Care | Payer: Medicare Other | Source: Ambulatory Visit | Attending: Family Medicine | Admitting: Family Medicine

## 2012-01-28 ENCOUNTER — Encounter: Payer: Self-pay | Admitting: Family Medicine

## 2012-01-28 VITALS — BP 160/90 | HR 76 | Temp 98.1°F | Ht 66.0 in | Wt 161.0 lb

## 2012-01-28 DIAGNOSIS — R17 Unspecified jaundice: Secondary | ICD-10-CM | POA: Diagnosis not present

## 2012-01-28 DIAGNOSIS — Z0000 Encounter for general adult medical examination without abnormal findings: Secondary | ICD-10-CM | POA: Diagnosis not present

## 2012-01-28 DIAGNOSIS — J45909 Unspecified asthma, uncomplicated: Secondary | ICD-10-CM | POA: Diagnosis not present

## 2012-01-28 DIAGNOSIS — D239 Other benign neoplasm of skin, unspecified: Secondary | ICD-10-CM

## 2012-01-28 DIAGNOSIS — R05 Cough: Secondary | ICD-10-CM | POA: Diagnosis not present

## 2012-01-28 DIAGNOSIS — E785 Hyperlipidemia, unspecified: Secondary | ICD-10-CM | POA: Diagnosis not present

## 2012-01-28 DIAGNOSIS — I10 Essential (primary) hypertension: Secondary | ICD-10-CM | POA: Diagnosis not present

## 2012-01-28 DIAGNOSIS — R972 Elevated prostate specific antigen [PSA]: Secondary | ICD-10-CM

## 2012-01-28 DIAGNOSIS — D229 Melanocytic nevi, unspecified: Secondary | ICD-10-CM

## 2012-01-28 DIAGNOSIS — R059 Cough, unspecified: Secondary | ICD-10-CM | POA: Diagnosis not present

## 2012-01-28 NOTE — Progress Notes (Signed)
77 yo here for medicare annual wellness visit.  I have personally reviewed the Medicare Annual Wellness questionnaire and have noted 1. The patient's medical and social history 2. Their use of alcohol, tobacco or illicit drugs 3. Their current medications and supplements 4. The patient's functional ability including ADL's, fall risks, home safety risks and hearing or visual             impairment. 5. Diet and physical activities 6. Evidence for depression or mood disorders  Hyperglycemia-  CBG elevated to 112 this month but improved from last year. Pt was fasting. Denies any family h/o DM.   Denies any increased thirst or urination. Still eating quite a bit of pasta. Lab Results  Component Value Date   HGBA1C 5.5 01/20/2011     HTN- has been on Amlodipine 10 mg dan Quinipril 10 mg for years (was previously on lower dose of amlodipine).  Does get white coat HTN so brings in home BP log today.  BPs at home range 1121-1432/73-78.  No CP, SOB, blurred vision.  Gets occasional trace edema in his legs since increased dose of amlodipine but not bothersome.  HLD- decreased Simvastatin to 20 mg last year. Lab Results  Component Value Date   CHOL 179 01/25/2012   HDL 65.90 01/25/2012   LDLCALC 92 01/25/2012   TRIG 106.0 01/25/2012   CHOLHDL 3 01/25/2012      Elevated PSA- followed by Dr. Laverle Patter.  Has had two biopsies that were both neg.  PSA has increased since January. Lab Results  Component Value Date   PSA 8.48* 01/25/2012   PSA 5.8 06/14/2009   PSA 5.4 11/02/2008   Cough- has a h/o asthma.  Feels that it has been worse this year.  Has not restarted his flonase.  . Patient Active Problem List  Diagnosis  . VITAMIN D DEFICIENCY  . HYPERLIPIDEMIA  . HYPERTENSION  . ACUTE BRONCHITIS  . ALLERGIC RHINITIS  . ASTHMA  . Routine general medical examination at a health care facility  . Hyperglycemia   Past Medical History  Diagnosis Date  . Allergy   . Asthma   . Hyperlipidemia     . Hypertension   . Elevated PSA     s/p neg biopsy   Past Surgical History  Procedure Date  . Tonsillectomy   . Prostate biopsy    History  Substance Use Topics  . Smoking status: Never Smoker   . Smokeless tobacco: Not on file  . Alcohol Use: Yes   Family History  Problem Relation Age of Onset  . Stroke Mother    Allergies  Allergen Reactions  . Penicillins    Current Outpatient Prescriptions on File Prior to Visit  Medication Sig Dispense Refill  . amLODipine (NORVASC) 10 MG tablet TAKE 1 TABLET BY MOUTH ONCE A DAY  90 tablet  3  . aspirin 81 MG tablet Take 81 mg by mouth daily.        . Calcium Carbonate-Vitamin D (TH CALCIUM CARBONATE-VITAMIN D) 600-400 MG-UNIT per tablet Take 1 tablet by mouth daily.        . cholecalciferol (VITAMIN D) 1000 UNITS tablet Take 1,000 Units by mouth daily.        . diphenhydrAMINE (BENADRYL) 25 MG tablet Take 25 mg by mouth as needed.        Marland Kitchen FLOVENT HFA 44 MCG/ACT inhaler USE AS DIRECTED AS NEEDED  10.6 g  3  . fluticasone (FLONASE) 50 MCG/ACT nasal spray Place 2  sprays into the nose daily.        Marland Kitchen loratadine (CLARITIN) 10 MG tablet Take 10 mg by mouth daily.        . Multiple Vitamin (MULTIVITAMIN) tablet Take 1 tablet by mouth daily.        . Omega-3 Fatty Acids (FISH OIL) 1000 MG CAPS Take 1 capsule by mouth daily.        Marland Kitchen PROAIR HFA 108 (90 BASE) MCG/ACT inhaler USE 2 PUFFS 4 TIMES DAILY AS NEEDED  8.5 g  12  . pseudoephedrine (SUDAFED) 30 MG tablet Take 30 mg by mouth as needed.        . quinapril (ACCUPRIL) 10 MG tablet TAKE 1 TABLET (10 MG TOTAL) BY MOUTH DAILY.  90 tablet  0  . simvastatin (ZOCOR) 40 MG tablet 1/2 tablet by mouth daily.  90 tablet  3   The PMH, PSH, Social History, Family History, Medications, and allergies have been reviewed in [facility], and have been updated if relevant.   Review of Systems       See HPI General:  Denies loss of appetite and malaise. Eyes:  Denies blurring. ENT:  Denies difficulty  swallowing. CV:  Denies chest pain or discomfort. Resp:  Denies shortness of breath. GI:  Denies abdominal pain, bloody stools, and change in bowel habits. GU:  Denies dysuria, incontinence, nocturia, urinary frequency, and urinary hesitancy. MS:  Denies joint pain, joint redness, and joint swelling. Derm:  Denies rash. Neuro:  Denies headaches. Psych:  Denies anxiety and depression. Endo:  Denies cold intolerance, heat intolerance, polyuria, and weight change. Heme:  Denies abnormal bruising and bleeding.   Physical Exam BP 160/90  Pulse 76  Temp 98.1 F (36.7 C)  Ht 5\' 6"  (1.676 m)  Wt 161 lb (73.029 kg)  BMI 25.99 kg/m2 `  General:  Well-developed,well-nourished,in no acute distress; alert,appropriate and cooperative throughout examination Head:  Normocephalic and atraumatic without obvious abnormalities. No apparent alopecia or balding. Eyes:  vision grossly intact, pupils equal, pupils round, and pupils reactive to light.   Ears:  R ear normal and L ear normal.   Nose:  no external deformity.   Mouth:  MMM Lungs:  Normal respiratory effort, chest expands symmetrically. Lungs are clear to auscultation, no crackles or wheezes. Heart:  Normal rate and regular rhythm. S1 and S2 normal without gallop, murmur, click, rub or other extra sounds. Abdomen:  Bowel sounds positive,abdomen soft and non-tender without masses, organomegaly or hernias noted. Extremities:  no edema Skin:  seborrheic keratosis. One nevi in upper center back - darker than others.  Psych:  memory intact for recent and remote and normally interactive.    Assessment and Plan: 1. Routine general medical examination at a health care facility  The patients weight, height, BMI and visual acuity have been recorded in the chart I have made referrals, counseling and provided education to the patient based review of the above and I have provided the pt with a written personalized care plan for preventive services.      2. HYPERTENSION  Stable on current meds based on home log.   3. HYPERLIPIDEMIA  Stable on lower dose of simvastatin.   4. ASTHMA  Deteriorated this year.  Advised to restart flonase.  Get a CXR today. DG Chest 2 View  5. Elevated bilirubin  Recheck CMET in 4 weeks. Comprehensive metabolic panel  6. Multiple nevi  Derm referral- ?atypical mole. Ambulatory referral to Dermatology  7. Elevated PSA  Results  forwarded to urology.

## 2012-01-28 NOTE — Patient Instructions (Addendum)
Good to see you. Please stop by to see Shirlee Limerick on your way out to set up your dermatology appointment. I want to come back in 4 weeks for a lab recheck. We are getting a chest xray today- I will call you with those results.

## 2012-03-07 ENCOUNTER — Other Ambulatory Visit: Payer: Self-pay | Admitting: Family Medicine

## 2012-03-10 ENCOUNTER — Other Ambulatory Visit (INDEPENDENT_AMBULATORY_CARE_PROVIDER_SITE_OTHER): Payer: Medicare Other

## 2012-03-10 DIAGNOSIS — Z23 Encounter for immunization: Secondary | ICD-10-CM | POA: Diagnosis not present

## 2012-03-10 DIAGNOSIS — R17 Unspecified jaundice: Secondary | ICD-10-CM | POA: Diagnosis not present

## 2012-03-10 LAB — COMPREHENSIVE METABOLIC PANEL
ALT: 21 U/L (ref 0–53)
AST: 22 U/L (ref 0–37)
Albumin: 4 g/dL (ref 3.5–5.2)
Alkaline Phosphatase: 48 U/L (ref 39–117)
BUN: 16 mg/dL (ref 6–23)
CO2: 25 mEq/L (ref 19–32)
Calcium: 8.9 mg/dL (ref 8.4–10.5)
Chloride: 108 mEq/L (ref 96–112)
Creatinine, Ser: 1 mg/dL (ref 0.4–1.5)
GFR: 75.62 mL/min (ref >60.00)
Glucose, Bld: 112 mg/dL — ABNORMAL HIGH (ref 70–99)
Potassium: 4.1 mEq/L (ref 3.5–5.1)
Sodium: 141 mEq/L (ref 135–145)
Total Bilirubin: 1.1 mg/dL (ref 0.3–1.2)
Total Protein: 7.1 g/dL (ref 6.0–8.3)

## 2012-03-25 ENCOUNTER — Other Ambulatory Visit: Payer: Self-pay | Admitting: Family Medicine

## 2012-03-27 ENCOUNTER — Other Ambulatory Visit: Payer: Self-pay | Admitting: Family Medicine

## 2012-05-04 DIAGNOSIS — L570 Actinic keratosis: Secondary | ICD-10-CM | POA: Diagnosis not present

## 2012-05-04 DIAGNOSIS — L819 Disorder of pigmentation, unspecified: Secondary | ICD-10-CM | POA: Diagnosis not present

## 2012-05-04 DIAGNOSIS — L821 Other seborrheic keratosis: Secondary | ICD-10-CM | POA: Diagnosis not present

## 2012-06-07 ENCOUNTER — Other Ambulatory Visit: Payer: Self-pay | Admitting: Family Medicine

## 2012-06-15 DIAGNOSIS — H2510 Age-related nuclear cataract, unspecified eye: Secondary | ICD-10-CM | POA: Diagnosis not present

## 2012-07-07 ENCOUNTER — Other Ambulatory Visit: Payer: Self-pay

## 2012-07-07 MED ORDER — FLUTICASONE PROPIONATE 50 MCG/ACT NA SUSP: NASAL | Status: DC

## 2012-07-07 MED ORDER — ALBUTEROL SULFATE HFA 108 (90 BASE) MCG/ACT IN AERS: 2.0000 | INHALATION_SPRAY | RESPIRATORY_TRACT | Status: DC | PRN

## 2012-07-07 MED ORDER — AMLODIPINE BESYLATE 10 MG PO TABS: 10.0000 mg | ORAL_TABLET | Freq: Every day | ORAL | Status: DC

## 2012-07-07 MED ORDER — QUINAPRIL HCL 10 MG PO TABS: 10.0000 mg | ORAL_TABLET | Freq: Every day | ORAL | Status: DC

## 2012-07-07 NOTE — Telephone Encounter (Signed)
Pt left note changing pharmacy and needs written rx for amlodipine 10 mg, quinapril 10 mg, fluticasone and albuterol inhaler. Pt also request written rx for Sinmvastatin 20 mg. (not on med list).Please advise. Call when rx ready for pick up.

## 2012-07-08 MED ORDER — QUINAPRIL HCL 10 MG PO TABS: ORAL_TABLET | ORAL | Status: DC

## 2012-07-08 MED ORDER — SIMVASTATIN 40 MG PO TABS: ORAL_TABLET | ORAL | Status: DC

## 2012-07-08 MED ORDER — ALBUTEROL SULFATE HFA 108 (90 BASE) MCG/ACT IN AERS: 2.0000 | INHALATION_SPRAY | RESPIRATORY_TRACT | Status: DC | PRN

## 2012-07-08 MED ORDER — AMLODIPINE BESYLATE 10 MG PO TABS: ORAL_TABLET | ORAL | Status: DC

## 2012-07-08 MED ORDER — FLUTICASONE PROPIONATE 50 MCG/ACT NA SUSP: NASAL | Status: DC

## 2012-07-08 NOTE — Addendum Note (Signed)
Addended by: Eliezer Bottom on: 07/08/2012 09:41 AM   Modules accepted: Orders

## 2012-07-08 NOTE — Telephone Encounter (Signed)
Scripts placed on doctor's desk for signature.

## 2012-07-15 DIAGNOSIS — R972 Elevated prostate specific antigen [PSA]: Secondary | ICD-10-CM | POA: Diagnosis not present

## 2012-07-20 DIAGNOSIS — R972 Elevated prostate specific antigen [PSA]: Secondary | ICD-10-CM | POA: Diagnosis not present

## 2012-07-20 DIAGNOSIS — N529 Male erectile dysfunction, unspecified: Secondary | ICD-10-CM | POA: Diagnosis not present

## 2012-07-20 DIAGNOSIS — N402 Nodular prostate without lower urinary tract symptoms: Secondary | ICD-10-CM | POA: Diagnosis not present

## 2012-10-24 ENCOUNTER — Emergency Department: Payer: Self-pay | Admitting: Emergency Medicine

## 2012-10-24 DIAGNOSIS — T169XXA Foreign body in ear, unspecified ear, initial encounter: Secondary | ICD-10-CM | POA: Diagnosis not present

## 2012-10-24 DIAGNOSIS — E7800 Pure hypercholesterolemia, unspecified: Secondary | ICD-10-CM | POA: Diagnosis not present

## 2012-10-24 DIAGNOSIS — Z880 Allergy status to penicillin: Secondary | ICD-10-CM | POA: Diagnosis not present

## 2012-10-24 DIAGNOSIS — Z79899 Other long term (current) drug therapy: Secondary | ICD-10-CM | POA: Diagnosis not present

## 2012-10-24 DIAGNOSIS — I10 Essential (primary) hypertension: Secondary | ICD-10-CM | POA: Diagnosis not present

## 2012-11-18 ENCOUNTER — Other Ambulatory Visit: Payer: Self-pay | Admitting: Family Medicine

## 2013-02-05 ENCOUNTER — Other Ambulatory Visit: Payer: Self-pay | Admitting: Family Medicine

## 2013-02-05 DIAGNOSIS — E785 Hyperlipidemia, unspecified: Secondary | ICD-10-CM

## 2013-02-05 DIAGNOSIS — R739 Hyperglycemia, unspecified: Secondary | ICD-10-CM

## 2013-02-05 DIAGNOSIS — I10 Essential (primary) hypertension: Secondary | ICD-10-CM

## 2013-02-05 DIAGNOSIS — E559 Vitamin D deficiency, unspecified: Secondary | ICD-10-CM

## 2013-02-05 DIAGNOSIS — R972 Elevated prostate specific antigen [PSA]: Secondary | ICD-10-CM

## 2013-02-06 ENCOUNTER — Other Ambulatory Visit (INDEPENDENT_AMBULATORY_CARE_PROVIDER_SITE_OTHER): Payer: Medicare Other

## 2013-02-06 DIAGNOSIS — R7309 Other abnormal glucose: Secondary | ICD-10-CM

## 2013-02-06 DIAGNOSIS — R972 Elevated prostate specific antigen [PSA]: Secondary | ICD-10-CM

## 2013-02-06 DIAGNOSIS — E785 Hyperlipidemia, unspecified: Secondary | ICD-10-CM | POA: Diagnosis not present

## 2013-02-06 DIAGNOSIS — R739 Hyperglycemia, unspecified: Secondary | ICD-10-CM

## 2013-02-06 DIAGNOSIS — I10 Essential (primary) hypertension: Secondary | ICD-10-CM

## 2013-02-06 LAB — COMPREHENSIVE METABOLIC PANEL
ALT: 20 U/L (ref 0–53)
AST: 20 U/L (ref 0–37)
Albumin: 4.2 g/dL (ref 3.5–5.2)
Alkaline Phosphatase: 56 U/L (ref 39–117)
BUN: 13 mg/dL (ref 6–23)
CO2: 24 mEq/L (ref 19–32)
Calcium: 9.7 mg/dL (ref 8.4–10.5)
Chloride: 104 mEq/L (ref 96–112)
Creatinine, Ser: 1.1 mg/dL (ref 0.4–1.5)
GFR: 67.72 mL/min (ref >60.00)
Glucose, Bld: 114 mg/dL — ABNORMAL HIGH (ref 70–99)
Potassium: 4.8 mEq/L (ref 3.5–5.1)
Sodium: 139 mEq/L (ref 135–145)
Total Bilirubin: 1.4 mg/dL — ABNORMAL HIGH (ref 0.3–1.2)
Total Protein: 7.3 g/dL (ref 6.0–8.3)

## 2013-02-06 LAB — PSA: PSA: 5.25 ng/mL — ABNORMAL HIGH (ref 0.10–4.00)

## 2013-02-06 LAB — LIPID PANEL
Cholesterol: 175 mg/dL (ref 0–200)
HDL: 59.2 mg/dL (ref >39.00)
LDL Cholesterol: 90 mg/dL (ref 0–99)
Total CHOL/HDL Ratio: 3
Triglycerides: 129 mg/dL (ref 0.0–149.0)
VLDL: 25.8 mg/dL (ref 0.0–40.0)

## 2013-02-06 LAB — HEMOGLOBIN A1C: Hgb A1c MFr Bld: 5.2 % (ref 4.6–6.5)

## 2013-02-09 ENCOUNTER — Encounter: Payer: Self-pay | Admitting: Family Medicine

## 2013-02-09 ENCOUNTER — Ambulatory Visit (INDEPENDENT_AMBULATORY_CARE_PROVIDER_SITE_OTHER): Payer: Medicare Other | Admitting: Family Medicine

## 2013-02-09 VITALS — BP 132/72 | HR 68 | Temp 97.9°F | Ht 66.25 in | Wt 166.0 lb

## 2013-02-09 DIAGNOSIS — I10 Essential (primary) hypertension: Secondary | ICD-10-CM

## 2013-02-09 DIAGNOSIS — R739 Hyperglycemia, unspecified: Secondary | ICD-10-CM

## 2013-02-09 DIAGNOSIS — Z0000 Encounter for general adult medical examination without abnormal findings: Secondary | ICD-10-CM | POA: Diagnosis not present

## 2013-02-09 DIAGNOSIS — R972 Elevated prostate specific antigen [PSA]: Secondary | ICD-10-CM

## 2013-02-09 DIAGNOSIS — E785 Hyperlipidemia, unspecified: Secondary | ICD-10-CM

## 2013-02-09 DIAGNOSIS — Z1211 Encounter for screening for malignant neoplasm of colon: Secondary | ICD-10-CM

## 2013-02-09 NOTE — Patient Instructions (Addendum)
Good to see you. Please stop by the lab on your way out to pick up your stool cards.

## 2013-02-09 NOTE — Progress Notes (Signed)
Very pleasant 78 yo here for medicare annual wellness visit.  I have personally reviewed the Medicare Annual Wellness questionnaire and have noted 1. The patient's medical and social history 2. Their use of alcohol, tobacco or illicit drugs 3. Their current medications and supplements 4. The patient's functional ability including ADL's, fall risks, home safety risks and hearing or visual             impairment. 5. Diet and physical activities 6. Evidence for depression or mood disorders  End of life wishes discussed and updated in Social History.  Had colonoscopy at 65, told he did not need another one again.  Our health maintenance tab was not accurate.  Hyperglycemia-  CBG elevated again 114 this month but a1c reassuring. Pt was fasting. Denies any family h/o DM.   Denies any increased thirst or urination. Still eating quite a bit of pasta. Lab Results  Component Value Date   HGBA1C 5.2 02/06/2013    HTN- has been on Amlodipine 10 mg dan Quinipril 10 mg for years (was previously on lower dose of amlodipine).  Does get white coat HTN so brings in home BP log today.  BPs at home normotensive.   No CP, SOB, blurred vision.  Gets occasional trace edema in his legs since increased dose of amlodipine but not bothersome.  HLD- well controlled on lower dose of simvastatin.  Lab Results  Component Value Date   CHOL 175 02/06/2013   HDL 59.20 02/06/2013   LDLCALC 90 02/06/2013   TRIG 129.0 02/06/2013   CHOLHDL 3 02/06/2013     Elevated PSA- followed by Dr. Laverle Patter.  Has had two biopsies that were both neg.  PSA has decreased since last year.  Dr. Laverle Patter told him he does not need to follow up with him.  Saw him this past winter. Lab Results  Component Value Date   PSA 5.25* 02/06/2013   PSA 8.48* 01/25/2012   PSA 5.8 06/14/2009   .Sees dermatologist- last saw her in 04/2012. Patient Active Problem List   Diagnosis Date Noted  . Elevated PSA 01/28/2012  . Routine general medical  examination at a health care facility 01/20/2011  . Hyperglycemia 01/20/2011  . VITAMIN D DEFICIENCY 11/04/2009  . HYPERLIPIDEMIA 11/04/2009  . HYPERTENSION 11/04/2009  . ALLERGIC RHINITIS 11/04/2009  . ASTHMA 11/04/2009   Past Medical History  Diagnosis Date  . Allergy   . Asthma   . Hyperlipidemia   . Hypertension   . Elevated PSA     s/p neg biopsy   Past Surgical History  Procedure Laterality Date  . Tonsillectomy    . Prostate biopsy     History  Substance Use Topics  . Smoking status: Never Smoker   . Smokeless tobacco: Not on file  . Alcohol Use: Yes   Family History  Problem Relation Age of Onset  . Stroke Mother    Allergies  Allergen Reactions  . Penicillins    Current Outpatient Prescriptions on File Prior to Visit  Medication Sig Dispense Refill  . albuterol (PROVENTIL HFA) 108 (90 BASE) MCG/ACT inhaler Inhale 2 puffs into the lungs every 4 (four) hours as needed.  1 Inhaler  3  . amLODipine (NORVASC) 10 MG tablet Take 1 tablet (10 mg total) by mouth daily.  90 tablet  3  . amLODipine (NORVASC) 10 MG tablet Take one tablet by mouth once a day  90 tablet  3  . amLODipine (NORVASC) 10 MG tablet TAKE 1 TABLET BY MOUTH  ONCE A DAY  90 tablet  0  . aspirin 81 MG tablet Take 81 mg by mouth daily.        . Calcium Carbonate-Vitamin D (TH CALCIUM CARBONATE-VITAMIN D) 600-400 MG-UNIT per tablet Take 1 tablet by mouth daily.        . cholecalciferol (VITAMIN D) 1000 UNITS tablet Take 1,000 Units by mouth daily.        . diphenhydrAMINE (BENADRYL) 25 MG tablet Take 25 mg by mouth as needed.        Marland Kitchen FLOVENT HFA 44 MCG/ACT inhaler USE AS DIRECTED AS NEEDED  10.6 g  3  . fluticasone (FLONASE) 50 MCG/ACT nasal spray USE 2 SPRAYS IN EACH NOSTRIL DURING ALLERGY SEASON.  48 g  3  . fluticasone (FLONASE) 50 MCG/ACT nasal spray Use 2 sprays each nostril during allergy season  48 g  3  . loratadine (CLARITIN) 10 MG tablet Take 10 mg by mouth daily.        . Multiple Vitamin  (MULTIVITAMIN) tablet Take 1 tablet by mouth daily.        . Omega-3 Fatty Acids (FISH OIL) 1000 MG CAPS Take 1 capsule by mouth daily.        Marland Kitchen PROAIR HFA 108 (90 BASE) MCG/ACT inhaler USE 2 PUFFS 4 TIMES DAILY AS NEEDED  8.5 g  12  . pseudoephedrine (SUDAFED) 30 MG tablet Take 30 mg by mouth as needed.        . quinapril (ACCUPRIL) 10 MG tablet Take 1 tablet (10 mg total) by mouth daily.  90 tablet  3  . quinapril (ACCUPRIL) 10 MG tablet Take one by mouth daily  90 tablet  3  . simvastatin (ZOCOR) 40 MG tablet 1/2 tablet by mouth daily.  90 tablet  3  . simvastatin (ZOCOR) 40 MG tablet TAKE 1 TABLET (40 MG TOTAL) BY MOUTH AT BEDTIME.  90 tablet  1  . simvastatin (ZOCOR) 40 MG tablet Take one tablet by mouth at bedtime  90 tablet  3   No current facility-administered medications on file prior to visit.   The PMH, PSH, Social History, Family History, Medications, and allergies have been reviewed in [facility], and have been updated if relevant.   Review of Systems       See HPI General:  Denies loss of appetite and malaise. Eyes:  Denies blurring. ENT:  Denies difficulty swallowing. CV:  Denies chest pain or discomfort. Resp:  Denies shortness of breath. GI:  Denies abdominal pain, bloody stools, and change in bowel habits. GU:  Denies dysuria, incontinence, nocturia, urinary frequency, and urinary hesitancy. MS:  Denies joint pain, joint redness, and joint swelling. Derm:  Denies rash. Neuro:  Denies headaches. Psych:  Denies anxiety and depression. Endo:  Denies cold intolerance, heat intolerance, polyuria, and weight change. Heme:  Denies abnormal bruising and bleeding.   Physical Exam BP 132/72  Pulse 68  Temp(Src) 97.9 F (36.6 C)  Ht 5' 6.25" (1.683 m)  Wt 166 lb (75.297 kg)  BMI 26.58 kg/m2 Wt Readings from Last 3 Encounters:  02/09/13 166 lb (75.297 kg)  01/28/12 161 lb (73.029 kg)  01/20/11 166 lb 8 oz (75.524 kg)   `  General:  Well-developed,well-nourished,in no  acute distress; alert,appropriate and cooperative throughout examination Head:  Normocephalic and atraumatic without obvious abnormalities. No apparent alopecia or balding. Eyes:  vision grossly intact, pupils equal, pupils round, and pupils reactive to light.   Ears:  R ear normal and L  ear normal.   Nose:  no external deformity.   Mouth:  MMM Lungs:  Normal respiratory effort, chest expands symmetrically. Lungs are clear to auscultation, no crackles or wheezes. Heart:  Normal rate and regular rhythm. S1 and S2 normal without gallop, murmur, click, rub or other extra sounds. Abdomen:  Bowel sounds positive,abdomen soft and non-tender without masses, organomegaly or hernias noted. Extremities:  no edema Skin:  seborrheic keratosis.  Psych:  memory intact for recent and remote and normally interactive.    Assessment and Plan:  1. Routine general medical examination at a health care facility The patients weight, height, BMI and visual acuity have been recorded in the chart I have made referrals, counseling and provided education to the patient based review of the above and I have provided the pt with a written personalized care plan for preventive services.   2. HYPERLIPIDEMIA Well controlled on current rx.  3. HYPERTENSION Well controlled on current rx. No changes.  4. Elevated PSA Improved.    5. Hyperglycemia a1c improved.  No changes.  6. Special screening for malignant neoplasms, colon  - Fecal occult blood, imunochemical; Future

## 2013-02-10 ENCOUNTER — Other Ambulatory Visit: Payer: Self-pay | Admitting: Family Medicine

## 2013-02-14 ENCOUNTER — Other Ambulatory Visit (INDEPENDENT_AMBULATORY_CARE_PROVIDER_SITE_OTHER): Payer: Medicare Other

## 2013-02-14 DIAGNOSIS — Z1211 Encounter for screening for malignant neoplasm of colon: Secondary | ICD-10-CM | POA: Diagnosis not present

## 2013-02-14 LAB — FECAL OCCULT BLOOD, IMMUNOCHEMICAL: Fecal Occult Bld: POSITIVE

## 2013-02-15 ENCOUNTER — Other Ambulatory Visit: Payer: Self-pay | Admitting: Family Medicine

## 2013-02-15 DIAGNOSIS — R195 Other fecal abnormalities: Secondary | ICD-10-CM

## 2013-02-15 LAB — FECAL OCCULT BLOOD, GUAIAC: Fecal Occult Blood: NEGATIVE

## 2013-02-22 ENCOUNTER — Other Ambulatory Visit: Payer: Self-pay | Admitting: Family Medicine

## 2013-03-17 DIAGNOSIS — Z23 Encounter for immunization: Secondary | ICD-10-CM | POA: Diagnosis not present

## 2013-04-05 ENCOUNTER — Encounter: Payer: Self-pay | Admitting: Family Medicine

## 2013-05-19 ENCOUNTER — Other Ambulatory Visit: Payer: Self-pay | Admitting: Family Medicine

## 2013-06-07 ENCOUNTER — Other Ambulatory Visit: Payer: Self-pay | Admitting: Family Medicine

## 2013-06-16 DIAGNOSIS — H43819 Vitreous degeneration, unspecified eye: Secondary | ICD-10-CM | POA: Diagnosis not present

## 2013-07-14 DIAGNOSIS — R972 Elevated prostate specific antigen [PSA]: Secondary | ICD-10-CM | POA: Diagnosis not present

## 2013-07-19 DIAGNOSIS — N529 Male erectile dysfunction, unspecified: Secondary | ICD-10-CM | POA: Diagnosis not present

## 2013-07-19 DIAGNOSIS — R972 Elevated prostate specific antigen [PSA]: Secondary | ICD-10-CM | POA: Diagnosis not present

## 2013-08-07 ENCOUNTER — Other Ambulatory Visit: Payer: Self-pay | Admitting: Family Medicine

## 2013-08-07 NOTE — Telephone Encounter (Signed)
Pt requesting medication refill. Last ov 01/2013, with no future appts scheduled. pls advise

## 2013-08-28 ENCOUNTER — Other Ambulatory Visit: Payer: Self-pay | Admitting: Family Medicine

## 2013-08-30 ENCOUNTER — Telehealth: Payer: Self-pay | Admitting: *Deleted

## 2013-08-30 NOTE — Telephone Encounter (Signed)
Ok to give 3 additional refills.

## 2013-08-30 NOTE — Telephone Encounter (Signed)
Pt came into office today regarding medication refill. He was upset that his BP meds were only filled for 59mo. Pts last ov was 01/2013 and per office/refill protocol, if pt has not been seen within 18mos, he is to receive 1 months worth of medication and schedule a f/u for any additional refills.  pls advise

## 2013-08-31 MED ORDER — AMLODIPINE BESYLATE 10 MG PO TABS: ORAL_TABLET | ORAL | Status: DC

## 2013-09-05 ENCOUNTER — Other Ambulatory Visit: Payer: Self-pay | Admitting: Family Medicine

## 2013-09-05 NOTE — Telephone Encounter (Signed)
Pt came into office requesting additional refills on quinapril. Per Dr Deborra Medina, ok to provide additional refills

## 2013-11-30 ENCOUNTER — Other Ambulatory Visit: Payer: Self-pay | Admitting: Family Medicine

## 2014-01-03 ENCOUNTER — Ambulatory Visit: Payer: Medicare Other | Admitting: Family Medicine

## 2014-02-12 ENCOUNTER — Other Ambulatory Visit: Payer: Self-pay | Admitting: Family Medicine

## 2014-02-12 DIAGNOSIS — I10 Essential (primary) hypertension: Secondary | ICD-10-CM

## 2014-02-12 DIAGNOSIS — R972 Elevated prostate specific antigen [PSA]: Secondary | ICD-10-CM

## 2014-02-12 DIAGNOSIS — R739 Hyperglycemia, unspecified: Secondary | ICD-10-CM

## 2014-02-12 DIAGNOSIS — Z0000 Encounter for general adult medical examination without abnormal findings: Secondary | ICD-10-CM

## 2014-02-12 DIAGNOSIS — E785 Hyperlipidemia, unspecified: Secondary | ICD-10-CM

## 2014-02-15 ENCOUNTER — Other Ambulatory Visit (INDEPENDENT_AMBULATORY_CARE_PROVIDER_SITE_OTHER): Payer: Medicare Other

## 2014-02-15 DIAGNOSIS — E785 Hyperlipidemia, unspecified: Secondary | ICD-10-CM | POA: Diagnosis not present

## 2014-02-15 DIAGNOSIS — R972 Elevated prostate specific antigen [PSA]: Secondary | ICD-10-CM

## 2014-02-15 DIAGNOSIS — I10 Essential (primary) hypertension: Secondary | ICD-10-CM

## 2014-02-15 DIAGNOSIS — R7309 Other abnormal glucose: Secondary | ICD-10-CM

## 2014-02-15 DIAGNOSIS — R739 Hyperglycemia, unspecified: Secondary | ICD-10-CM

## 2014-02-15 LAB — COMPREHENSIVE METABOLIC PANEL
ALT: 20 U/L (ref 0–53)
AST: 16 U/L (ref 0–37)
Albumin: 4.1 g/dL (ref 3.5–5.2)
Alkaline Phosphatase: 58 U/L (ref 39–117)
BUN: 19 mg/dL (ref 6–23)
CO2: 27 mEq/L (ref 19–32)
Calcium: 9.6 mg/dL (ref 8.4–10.5)
Chloride: 106 mEq/L (ref 96–112)
Creatinine, Ser: 1.2 mg/dL (ref 0.4–1.5)
GFR: 63.59 mL/min (ref >60.00)
Glucose, Bld: 109 mg/dL — ABNORMAL HIGH (ref 70–99)
Potassium: 4.6 mEq/L (ref 3.5–5.1)
Sodium: 140 mEq/L (ref 135–145)
Total Bilirubin: 1.2 mg/dL (ref 0.2–1.2)
Total Protein: 6.7 g/dL (ref 6.0–8.3)

## 2014-02-15 LAB — LIPID PANEL
Cholesterol: 158 mg/dL (ref 0–200)
HDL: 56.2 mg/dL (ref >39.00)
LDL Cholesterol: 89 mg/dL (ref 0–99)
NonHDL: 101.8
Total CHOL/HDL Ratio: 3
Triglycerides: 65 mg/dL (ref 0.0–149.0)
VLDL: 13 mg/dL (ref 0.0–40.0)

## 2014-02-15 LAB — PSA: PSA: 5.16 ng/mL — ABNORMAL HIGH (ref 0.10–4.00)

## 2014-02-19 ENCOUNTER — Ambulatory Visit (INDEPENDENT_AMBULATORY_CARE_PROVIDER_SITE_OTHER): Payer: Medicare Other | Admitting: Family Medicine

## 2014-02-19 ENCOUNTER — Telehealth: Payer: Self-pay | Admitting: Family Medicine

## 2014-02-19 ENCOUNTER — Encounter: Payer: Self-pay | Admitting: Family Medicine

## 2014-02-19 VITALS — BP 118/76 | HR 74 | Temp 98.3°F | Ht 66.0 in | Wt 163.2 lb

## 2014-02-19 DIAGNOSIS — E785 Hyperlipidemia, unspecified: Secondary | ICD-10-CM

## 2014-02-19 DIAGNOSIS — R195 Other fecal abnormalities: Secondary | ICD-10-CM

## 2014-02-19 DIAGNOSIS — Z23 Encounter for immunization: Secondary | ICD-10-CM | POA: Diagnosis not present

## 2014-02-19 DIAGNOSIS — Z0000 Encounter for general adult medical examination without abnormal findings: Secondary | ICD-10-CM | POA: Diagnosis not present

## 2014-02-19 DIAGNOSIS — R972 Elevated prostate specific antigen [PSA]: Secondary | ICD-10-CM

## 2014-02-19 DIAGNOSIS — I10 Essential (primary) hypertension: Secondary | ICD-10-CM

## 2014-02-19 DIAGNOSIS — R7309 Other abnormal glucose: Secondary | ICD-10-CM

## 2014-02-19 DIAGNOSIS — R739 Hyperglycemia, unspecified: Secondary | ICD-10-CM

## 2014-02-19 DIAGNOSIS — M1990 Unspecified osteoarthritis, unspecified site: Secondary | ICD-10-CM

## 2014-02-19 DIAGNOSIS — M129 Arthropathy, unspecified: Secondary | ICD-10-CM

## 2014-02-19 NOTE — Telephone Encounter (Signed)
Relevant patient education assigned to patient using Emmi. ° °

## 2014-02-19 NOTE — Assessment & Plan Note (Signed)
Pos IFOB last year- did not return for repeat IFOB. Repeat today.

## 2014-02-19 NOTE — Progress Notes (Signed)
Pre visit review using our clinic review tool, if applicable. No additional management support is needed unless otherwise documented below in the visit note. 

## 2014-02-19 NOTE — Progress Notes (Signed)
Very pleasant 79 yo here for medicare annual wellness visit.  I have personally reviewed the Medicare Annual Wellness questionnaire and have noted 1. The patient's medical and social history 2. Their use of alcohol, tobacco or illicit drugs 3. Their current medications and supplements 4. The patient's functional ability including ADL's, fall risks, home safety risks and hearing or visual             impairment. 5. Diet and physical activities 6. Evidence for depression or mood disorders  End of life wishes discussed and updated in Social History.  The roster of all physicians providing medical care to patient - is listed in the Snapshot section of the chart.  Had colonoscopy at 39, told he did not need another one again.   Neg IFOB 02/15/13- positive, did not return for follow up. Pneumovax 09/07/07 Td 10/31/08 Zoster 09/07/07 Dilated eye exam 3/15  Hyperglycemia-  CBG elevated again this month but improved.  Previous a1cs have been reassuring.  Denies any family h/o DM.   Denies any increased thirst or urination.  Lab Results  Component Value Date   HGBA1C 5.2 02/06/2013    HTN- has been on Amlodipine 10 mg dan Quinipril 10 mg for years (was previously on lower dose of amlodipine).  Does get white coat HTN so brings in home BP log today.  BPs at home normotensive.   No CP, SOB, blurred vision.   HLD- well controlled on lower dose of simvastatin.  Lab Results  Component Value Date   CHOL 158 02/15/2014   HDL 56.20 02/15/2014   LDLCALC 89 02/15/2014   TRIG 65.0 02/15/2014   CHOLHDL 3 02/15/2014    Elevated PSA- followed by Dr. Alinda Money.  Has had two biopsies that were both neg.  PSA has decreased again since last year.   Lab Results  Component Value Date   PSA 5.16* 02/15/2014   PSA 5.25* 02/06/2013   PSA 8.48* 01/25/2012    Patient Active Problem List   Diagnosis Date Noted  . Medicare annual wellness visit, subsequent 02/19/2014  . Elevated PSA 01/28/2012  . Hyperglycemia  01/20/2011  . VITAMIN D DEFICIENCY 11/04/2009  . HYPERLIPIDEMIA 11/04/2009  . HYPERTENSION 11/04/2009  . ALLERGIC RHINITIS 11/04/2009  . ASTHMA 11/04/2009   Past Medical History  Diagnosis Date  . Allergy   . Asthma   . Hyperlipidemia   . Hypertension   . Elevated PSA     s/p neg biopsy   Past Surgical History  Procedure Laterality Date  . Tonsillectomy    . Prostate biopsy     History  Substance Use Topics  . Smoking status: Never Smoker   . Smokeless tobacco: Not on file  . Alcohol Use: Yes   Family History  Problem Relation Age of Onset  . Stroke Mother    Allergies  Allergen Reactions  . Penicillins    Current Outpatient Prescriptions on File Prior to Visit  Medication Sig Dispense Refill  . albuterol (PROVENTIL HFA) 108 (90 BASE) MCG/ACT inhaler Inhale 2 puffs into the lungs every 4 (four) hours as needed.  1 Inhaler  3  . amLODipine (NORVASC) 10 MG tablet TAKE 1 TABLET BY MOUTH ONCE A DAY  90 tablet  0  . aspirin 81 MG tablet Take 81 mg by mouth daily.        . Calcium Carbonate-Vitamin D (TH CALCIUM CARBONATE-VITAMIN D) 600-400 MG-UNIT per tablet Take 1 tablet by mouth daily.        Marland Kitchen  cholecalciferol (VITAMIN D) 1000 UNITS tablet Take 1,000 Units by mouth daily.        . diphenhydrAMINE (BENADRYL) 25 MG tablet Take 25 mg by mouth as needed.        Marland Kitchen FLOVENT HFA 44 MCG/ACT inhaler USE AS DIRECTED AS NEEDED  10.6 g  3  . fluticasone (FLONASE) 50 MCG/ACT nasal spray USE 2 SPRAYS IN EACH NOSTRIL DURING ALLERGY SEASON.  16 g  8  . loratadine (CLARITIN) 10 MG tablet Take 10 mg by mouth daily.        . Multiple Vitamin (MULTIVITAMIN) tablet Take 1 tablet by mouth daily.        . Omega-3 Fatty Acids (FISH OIL) 1000 MG CAPS Take 1 capsule by mouth daily.        Marland Kitchen PROAIR HFA 108 (90 BASE) MCG/ACT inhaler USE 2 PUFFS 4 TIMES DAILY AS NEEDED  8.5 g  12  . pseudoephedrine (SUDAFED) 30 MG tablet Take 30 mg by mouth as needed.        . quinapril (ACCUPRIL) 10 MG tablet TAKE  1 TABLET (10 MG TOTAL) BY MOUTH DAILY.  30 tablet  0  . simvastatin (ZOCOR) 40 MG tablet TAKE 1 TABLET (40 MG TOTAL) BY MOUTH AT BEDTIME.  30 tablet  0   No current facility-administered medications on file prior to visit.   The PMH, PSH, Social History, Family History, Medications, and allergies have been reviewed in [facility], and have been updated if relevant.   Review of Systems       See HPI General:  Denies loss of appetite and malaise- weight stable Wt Readings from Last 3 Encounters:  02/19/14 163 lb 4 oz (74.05 kg)  02/09/13 166 lb (75.297 kg)  01/28/12 161 lb (73.029 kg)    Eyes:  Denies blurring. ENT:  Denies difficulty swallowing. CV:  Denies chest pain or discomfort. Resp:  Denies shortness of breath. GI:  Denies abdominal pain, bloody stools, and change in bowel habits. GU:  Denies dysuria, incontinence, nocturia, urinary frequency, and urinary hesitancy. MS:  Denies joint pain, joint redness, and joint swelling. Derm:  Denies rash. Neuro:  Denies headaches. Psych:  Denies anxiety and depression-has had multiple loses recently- most difficult was losing his sister to cancer in 09/2013 but he feels he is coping ok- talks about her all the time. Endo:  Denies cold intolerance, heat intolerance, polyuria, and weight change. Heme:  Denies abnormal bruising and bleeding.   Physical Exam BP 118/76  Pulse 74  Temp(Src) 98.3 F (36.8 C) (Oral)  Ht 5\' 6"  (1.676 m)  Wt 163 lb 4 oz (74.05 kg)  BMI 26.36 kg/m2  SpO2 96% Wt Readings from Last 3 Encounters:  02/19/14 163 lb 4 oz (74.05 kg)  02/09/13 166 lb (75.297 kg)  01/28/12 161 lb (73.029 kg)   `  General:  Well-developed,well-nourished,in no acute distress; alert,appropriate and cooperative throughout examination Head:  Normocephalic and atraumatic without obvious abnormalities. No apparent alopecia or balding. Eyes:  vision grossly intact, pupils equal, pupils round, and pupils reactive to light.   Ears:  R ear normal  and L ear normal.   Nose:  no external deformity.   Mouth:  MMM Lungs:  Normal respiratory effort, chest expands symmetrically. Lungs are clear to auscultation, no crackles or wheezes. Heart:  Normal rate and regular rhythm. S1 and S2 normal without gallop, murmur, click, rub or other extra sounds. Abdomen:  Bowel sounds positive,abdomen soft and non-tender without masses, organomegaly or  hernias noted. Extremities:  no edema Skin:  seborrheic keratosis.  Psych:  memory intact for recent and remote and normally interactive.    Assessment and Plan:

## 2014-02-19 NOTE — Assessment & Plan Note (Signed)
Well controlled on current rx. No changes made. 

## 2014-02-19 NOTE — Assessment & Plan Note (Signed)
The patients weight, height, BMI and visual acuity have been recorded in the chart I have made referrals, counseling and provided education to the patient based review of the above and I have provided the pt with a written personalized care plan for preventive services.  Prevnar 13 today. Declines flu shot.

## 2014-02-19 NOTE — Patient Instructions (Signed)
Great to see you. Please cut your amlodipine in half- 5 mg daily. Keep your blood pressure log for me and update me in 2 weeks.

## 2014-02-19 NOTE — Assessment & Plan Note (Signed)
Well controlled.  Continue Accupril 10 mg daily, decrease amlodipine to 10 mg daily. He will check his BP at home and call me with an update in 2 weeks. The patient indicates understanding of these issues and agrees with the plan.

## 2014-02-21 ENCOUNTER — Other Ambulatory Visit (INDEPENDENT_AMBULATORY_CARE_PROVIDER_SITE_OTHER): Payer: Medicare Other

## 2014-02-21 ENCOUNTER — Other Ambulatory Visit: Payer: Self-pay | Admitting: Family Medicine

## 2014-02-21 DIAGNOSIS — Z1211 Encounter for screening for malignant neoplasm of colon: Secondary | ICD-10-CM

## 2014-02-21 LAB — FECAL OCCULT BLOOD, IMMUNOCHEMICAL: Fecal Occult Bld: NEGATIVE

## 2014-02-22 ENCOUNTER — Encounter: Payer: Self-pay | Admitting: *Deleted

## 2014-03-06 ENCOUNTER — Other Ambulatory Visit: Payer: Self-pay | Admitting: Family Medicine

## 2014-03-12 ENCOUNTER — Telehealth: Payer: Self-pay | Admitting: *Deleted

## 2014-03-12 NOTE — Telephone Encounter (Signed)
Pt dropped off BP readings and based on readings, per Dr Deborra Medina, ok for pt to decrease amlodipine from 10mg  to 5mg . Meds updated

## 2014-04-03 DIAGNOSIS — Z23 Encounter for immunization: Secondary | ICD-10-CM | POA: Diagnosis not present

## 2014-05-28 ENCOUNTER — Other Ambulatory Visit: Payer: Self-pay | Admitting: *Deleted

## 2014-05-28 MED ORDER — AMLODIPINE BESYLATE 10 MG PO TABS: ORAL_TABLET | ORAL | Status: DC

## 2014-05-28 NOTE — Telephone Encounter (Signed)
Received faxed refill request from pharmacy. See drug warning Amlodipine and Simvastatin. Is it okay to refill medication?

## 2014-06-07 ENCOUNTER — Telehealth: Payer: Self-pay

## 2014-06-07 ENCOUNTER — Ambulatory Visit: Payer: Medicare Other | Admitting: Internal Medicine

## 2014-06-07 DIAGNOSIS — I10 Essential (primary) hypertension: Secondary | ICD-10-CM | POA: Diagnosis not present

## 2014-06-07 NOTE — Telephone Encounter (Signed)
Pt walked in; pt has been taking BP at home and having no problems; Aug 2015 Amlodipine 10 mg was decreased to 1/2 tab daily and pt also takes Quinapril 10 mg daily; 06/06/14 pts BP was 161/92 then later 152/89 and then 138/78; this morning 30 mins ago BP 185/93; pt feels lightheaded, shaky and not feeling well. Pt took Amlodipine 10 mg one tab and Quinapril 10 mg one tab today at 7 AM. Pt has no h/a,CP,SOB and is not diabetic. Pt wants to know if safe to wait until 1:45 for appt. No other available appts at [facility]. Dr Deborra Medina said could not say it is OK for pt to wait for appt and would advise pt go to UC to be seen for eval now. Pt voiced understanding and pt will go to [facility] now; pts wife is driving.

## 2014-06-11 ENCOUNTER — Ambulatory Visit (INDEPENDENT_AMBULATORY_CARE_PROVIDER_SITE_OTHER): Payer: Medicare Other | Admitting: Family Medicine

## 2014-06-11 ENCOUNTER — Encounter: Payer: Self-pay | Admitting: Family Medicine

## 2014-06-11 VITALS — BP 154/90 | HR 70 | Temp 98.0°F | Wt 164.0 lb

## 2014-06-11 DIAGNOSIS — I10 Essential (primary) hypertension: Secondary | ICD-10-CM

## 2014-06-11 MED ORDER — AMLODIPINE BESYLATE 10 MG PO TABS: 10.0000 mg | ORAL_TABLET | Freq: Every day | ORAL | Status: DC

## 2014-06-11 NOTE — Assessment & Plan Note (Signed)
Deteriorated acutely. Agree with increased dose of norvasc- 10 mg daily. Continue keeping twice daily BP log. Advised Elijah [name] that it may take a week or two for his BP to normalize. Currently asymptomatic. I will check a CMET today to rule out other possible causes of acute deterioration of blood pressure. The patient indicates understanding of these issues and agrees with the plan.

## 2014-06-11 NOTE — Progress Notes (Signed)
Pre visit review using our clinic review tool, if applicable. No additional management support is needed unless otherwise documented below in the visit note. 

## 2014-06-11 NOTE — Patient Instructions (Signed)
Great to see you. Continue your 10 mg daily dose of Norvasc. Keep checking your blood pressure.  Call me with the readings.  We will call you with your lab results.  Happy Holidays!

## 2014-06-11 NOTE — Progress Notes (Signed)
Patient ID: Calab Sachse, male   DOB: 05/03/1935, 79 y.o.   MRN: [id number]   Subjective:   Patient ID: [name], male    DOB: [birth year], 79 y.o.   MRN: [id number]  Oneill Bais is a pleasant 79 y.o. year old male who presents to clinic today with Follow-up  on 06/11/2014  HPI:  Follow up [facility]-  Last Wednesday, checked his BP at home- 170/95.  Was not symptomatic. Next day, rechecked it several times- 170s-189/105  Felt shaky and light headed.  No CP, SOB or palpitations.  Went to [facility]- 160s/90s.  Increased his Norvasc to 10 mg daily, still taking Accupril 10 mg daily.  BPs have been improving but not yet quite at goal.  No recent changes in his diet- no increased salt or fat intake.    Wt Readings from Last 3 Encounters:  06/11/14 164 lb (74.39 kg)  02/19/14 163 lb 4 oz (74.05 kg)  02/09/13 166 lb (75.297 kg)   Current Outpatient Prescriptions on File Prior to Visit  Medication Sig Dispense Refill  . albuterol (PROVENTIL HFA) 108 (90 BASE) MCG/ACT inhaler Inhale 2 puffs into the lungs every 4 (four) hours as needed. 1 Inhaler 3  . aspirin 81 MG tablet Take 81 mg by mouth daily.      . Calcium Carbonate-Vitamin D (TH CALCIUM CARBONATE-VITAMIN D) 600-400 MG-UNIT per tablet Take 1 tablet by mouth daily.      . cholecalciferol (VITAMIN D) 1000 UNITS tablet Take 1,000 Units by mouth daily.      . diphenhydrAMINE (BENADRYL) 25 MG tablet Take 25 mg by mouth as needed.      Marland Kitchen FLOVENT HFA 44 MCG/ACT inhaler USE AS DIRECTED AS NEEDED 10.6 g 3  . fluticasone (FLONASE) 50 MCG/ACT nasal spray USE 2 SPRAYS IN EACH NOSTRIL DURING ALLERGY SEASON. 16 g 8  . loratadine (CLARITIN) 10 MG tablet Take 10 mg by mouth daily.      . Multiple Vitamin (MULTIVITAMIN) tablet Take 1 tablet by mouth daily.      . Omega-3 Fatty Acids (FISH OIL) 1000 MG CAPS Take 1 capsule by mouth daily.      Marland Kitchen PROAIR HFA 108 (90 BASE) MCG/ACT inhaler USE 2 PUFFS 4 TIMES DAILY AS NEEDED 8.5 g 12  .  pseudoephedrine (SUDAFED) 30 MG tablet Take 30 mg by mouth as needed.      . quinapril (ACCUPRIL) 10 MG tablet TAKE 1 TABLET (10 MG TOTAL) BY MOUTH DAILY. 90 tablet 3  . simvastatin (ZOCOR) 40 MG tablet TAKE 1 TABLET (40 MG TOTAL) BY MOUTH AT BEDTIME. 30 tablet 0   No current facility-administered medications on file prior to visit.    Allergies  Allergen Reactions  . Penicillins     Past Medical History  Diagnosis Date  . Allergy   . Asthma   . Hyperlipidemia   . Hypertension   . Elevated PSA     s/p neg biopsy    Past Surgical History  Procedure Laterality Date  . Tonsillectomy    . Prostate biopsy      Family History  Problem Relation Age of Onset  . Stroke Mother     History   Social History  . Marital Status: Married    Spouse Name: N/A    Number of Children: 1  . Years of Education: N/A   Occupational History  . Retired Event organiser    Social History Main Topics  . Smoking status: Never Smoker   .  Smokeless tobacco: Not on file  . Alcohol Use: Yes  . Drug Use: No  . Sexual Activity: Not on file   Other Topics Concern  . Not on file   Social History Narrative   One biological son, one adopted son, two granddaughters, 27 & 22 yo      Has living will and HPOA- wife Keyandre Pileggi.   Would desire CPR.  Would not want prolonged life support.   The PMH, PSH, Social History, Family History, Medications, and allergies have been reviewed in [facility], and have been updated if relevant.   Review of Systems  Constitutional: Negative.   HENT: Negative.   Respiratory: Negative.   Cardiovascular: Negative.   Endocrine: Negative.   Genitourinary: Negative.   Neurological: Negative.   Hematological: Negative.   Psychiatric/Behavioral: Negative.   All other systems reviewed and are negative.      Objective:    BP 154/90 mmHg  Pulse 70  Temp(Src) 98 F (36.7 C) (Oral)  Wt 164 lb (74.39 kg)  SpO2 98% BP Readings from Last 3 Encounters:  06/11/14  154/90  02/19/14 118/76  02/09/13 132/72     Physical Exam  Constitutional: He is oriented to person, place, and time. He appears well-developed and well-nourished. No distress.  HENT:  Head: Normocephalic and atraumatic.  Eyes: Conjunctivae are normal.  Cardiovascular: Normal rate and regular rhythm.   Pulmonary/Chest: Effort normal and breath sounds normal.  Musculoskeletal: He exhibits no edema.  Neurological: He is alert and oriented to person, place, and time. No cranial nerve deficit.  Skin: Skin is warm and dry.  Psychiatric: He has a normal mood and affect. His behavior is normal. Judgment and thought content normal.  Nursing note and vitals reviewed.         Assessment & Plan:   Essential hypertension No Follow-up on file.

## 2014-06-12 LAB — COMPREHENSIVE METABOLIC PANEL
ALT: 21 U/L (ref 0–53)
AST: 21 U/L (ref 0–37)
Albumin: 4.5 g/dL (ref 3.5–5.2)
Alkaline Phosphatase: 61 U/L (ref 39–117)
BUN: 21 mg/dL (ref 6–23)
CO2: 24 mEq/L (ref 19–32)
Calcium: 9.9 mg/dL (ref 8.4–10.5)
Chloride: 107 mEq/L (ref 96–112)
Creatinine, Ser: 1.2 mg/dL (ref 0.4–1.5)
GFR: 60.56 mL/min (ref >60.00)
Glucose, Bld: 97 mg/dL (ref 70–99)
Potassium: 4.4 mEq/L (ref 3.5–5.1)
Sodium: 138 mEq/L (ref 135–145)
Total Bilirubin: 1.1 mg/dL (ref 0.2–1.2)
Total Protein: 7.7 g/dL (ref 6.0–8.3)

## 2014-08-13 DIAGNOSIS — R972 Elevated prostate specific antigen [PSA]: Secondary | ICD-10-CM | POA: Diagnosis not present

## 2014-08-15 DIAGNOSIS — N402 Nodular prostate without lower urinary tract symptoms: Secondary | ICD-10-CM | POA: Diagnosis not present

## 2014-08-15 DIAGNOSIS — D2261 Melanocytic nevi of right upper limb, including shoulder: Secondary | ICD-10-CM | POA: Diagnosis not present

## 2014-08-15 DIAGNOSIS — D2271 Melanocytic nevi of right lower limb, including hip: Secondary | ICD-10-CM | POA: Diagnosis not present

## 2014-08-15 DIAGNOSIS — R310 Gross hematuria: Secondary | ICD-10-CM | POA: Diagnosis not present

## 2014-08-15 DIAGNOSIS — L821 Other seborrheic keratosis: Secondary | ICD-10-CM | POA: Diagnosis not present

## 2014-08-15 DIAGNOSIS — D225 Melanocytic nevi of trunk: Secondary | ICD-10-CM | POA: Diagnosis not present

## 2014-08-15 DIAGNOSIS — N5201 Erectile dysfunction due to arterial insufficiency: Secondary | ICD-10-CM | POA: Diagnosis not present

## 2014-08-15 DIAGNOSIS — D485 Neoplasm of uncertain behavior of skin: Secondary | ICD-10-CM | POA: Diagnosis not present

## 2014-08-15 DIAGNOSIS — R972 Elevated prostate specific antigen [PSA]: Secondary | ICD-10-CM | POA: Diagnosis not present

## 2014-08-15 DIAGNOSIS — D0339 Melanoma in situ of other parts of face: Secondary | ICD-10-CM | POA: Diagnosis not present

## 2014-08-22 DIAGNOSIS — N200 Calculus of kidney: Secondary | ICD-10-CM | POA: Diagnosis not present

## 2014-08-22 DIAGNOSIS — K4090 Unilateral inguinal hernia, without obstruction or gangrene, not specified as recurrent: Secondary | ICD-10-CM | POA: Diagnosis not present

## 2014-08-22 DIAGNOSIS — N400 Enlarged prostate without lower urinary tract symptoms: Secondary | ICD-10-CM | POA: Diagnosis not present

## 2014-08-22 DIAGNOSIS — R310 Gross hematuria: Secondary | ICD-10-CM | POA: Diagnosis not present

## 2014-08-25 ENCOUNTER — Other Ambulatory Visit: Payer: Self-pay | Admitting: Family Medicine

## 2014-08-29 ENCOUNTER — Other Ambulatory Visit (HOSPITAL_COMMUNITY): Payer: Self-pay | Admitting: Urology

## 2014-08-29 DIAGNOSIS — N289 Disorder of kidney and ureter, unspecified: Secondary | ICD-10-CM

## 2014-09-06 ENCOUNTER — Other Ambulatory Visit: Payer: Self-pay | Admitting: Family Medicine

## 2014-09-06 ENCOUNTER — Ambulatory Visit (HOSPITAL_COMMUNITY)
Admission: RE | Admit: 2014-09-06 | Discharge: 2014-09-06 | Disposition: A | Discharge status: Home / Self Care | Payer: Medicare Other | Source: Ambulatory Visit | Attending: Urology | Admitting: Urology

## 2014-09-06 DIAGNOSIS — R310 Gross hematuria: Secondary | ICD-10-CM | POA: Insufficient documentation

## 2014-09-06 DIAGNOSIS — K5730 Diverticulosis of large intestine without perforation or abscess without bleeding: Secondary | ICD-10-CM | POA: Insufficient documentation

## 2014-09-06 DIAGNOSIS — C4339 Malignant melanoma of other parts of face: Secondary | ICD-10-CM | POA: Diagnosis not present

## 2014-09-06 DIAGNOSIS — N281 Cyst of kidney, acquired: Secondary | ICD-10-CM | POA: Diagnosis not present

## 2014-09-06 DIAGNOSIS — R934 Abnormal findings on diagnostic imaging of urinary organs: Secondary | ICD-10-CM | POA: Diagnosis not present

## 2014-09-06 DIAGNOSIS — M5136 Other intervertebral disc degeneration, lumbar region: Secondary | ICD-10-CM | POA: Insufficient documentation

## 2014-09-06 DIAGNOSIS — N289 Disorder of kidney and ureter, unspecified: Secondary | ICD-10-CM

## 2014-09-06 DIAGNOSIS — D1803 Hemangioma of intra-abdominal structures: Secondary | ICD-10-CM | POA: Diagnosis not present

## 2014-09-06 MED ORDER — GADOBENATE DIMEGLUMINE 529 MG/ML IV SOLN
15.0000 mL | Freq: Once | INTRAVENOUS | Status: AC | PRN
  Administered 2014-09-06: 15 mL via INTRAVENOUS

## 2014-10-03 DIAGNOSIS — L578 Other skin changes due to chronic exposure to nonionizing radiation: Secondary | ICD-10-CM | POA: Diagnosis not present

## 2014-10-03 DIAGNOSIS — L908 Other atrophic disorders of skin: Secondary | ICD-10-CM | POA: Diagnosis not present

## 2014-10-03 DIAGNOSIS — D0339 Melanoma in situ of other parts of face: Secondary | ICD-10-CM | POA: Diagnosis not present

## 2014-10-03 DIAGNOSIS — Z681 Body mass index (BMI) 19 or less, adult: Secondary | ICD-10-CM | POA: Diagnosis not present

## 2014-11-07 DIAGNOSIS — R310 Gross hematuria: Secondary | ICD-10-CM | POA: Diagnosis not present

## 2014-11-14 ENCOUNTER — Other Ambulatory Visit: Payer: Self-pay | Admitting: Family Medicine

## 2014-11-14 DIAGNOSIS — R310 Gross hematuria: Secondary | ICD-10-CM | POA: Diagnosis not present

## 2014-11-14 DIAGNOSIS — R972 Elevated prostate specific antigen [PSA]: Secondary | ICD-10-CM | POA: Diagnosis not present

## 2014-11-14 DIAGNOSIS — N5201 Erectile dysfunction due to arterial insufficiency: Secondary | ICD-10-CM | POA: Diagnosis not present

## 2014-12-04 ENCOUNTER — Other Ambulatory Visit: Payer: Self-pay | Admitting: Family Medicine

## 2014-12-04 ENCOUNTER — Other Ambulatory Visit (INDEPENDENT_AMBULATORY_CARE_PROVIDER_SITE_OTHER): Payer: Medicare Other

## 2014-12-04 DIAGNOSIS — R972 Elevated prostate specific antigen [PSA]: Secondary | ICD-10-CM

## 2014-12-04 DIAGNOSIS — E785 Hyperlipidemia, unspecified: Secondary | ICD-10-CM

## 2014-12-04 DIAGNOSIS — Z0000 Encounter for general adult medical examination without abnormal findings: Secondary | ICD-10-CM

## 2014-12-04 DIAGNOSIS — I10 Essential (primary) hypertension: Secondary | ICD-10-CM

## 2014-12-04 LAB — LIPID PANEL
Cholesterol: 164 mg/dL (ref 0–200)
HDL: 61.9 mg/dL (ref >39.00)
LDL Cholesterol: 88 mg/dL (ref 0–99)
NonHDL: 102.1
Total CHOL/HDL Ratio: 3
Triglycerides: 71 mg/dL (ref 0.0–149.0)
VLDL: 14.2 mg/dL (ref 0.0–40.0)

## 2014-12-04 LAB — CBC WITH DIFFERENTIAL/PLATELET
Basophils Absolute: 0.1 10*3/uL (ref 0.0–0.1)
Basophils Relative: 0.8 % (ref 0.0–3.0)
Eosinophils Absolute: 0.3 10*3/uL (ref 0.0–0.7)
Eosinophils Relative: 4.5 % (ref 0.0–5.0)
HCT: 46.2 % (ref 39.0–52.0)
Hemoglobin: 15.7 g/dL (ref 13.0–17.0)
Lymphocytes Relative: 25.9 % (ref 12.0–46.0)
Lymphs Abs: 1.8 10*3/uL (ref 0.7–4.0)
MCHC: 34 g/dL (ref 30.0–36.0)
MCV: 88.3 fl (ref 78.0–100.0)
Monocytes Absolute: 0.8 10*3/uL (ref 0.1–1.0)
Monocytes Relative: 11.2 % (ref 3.0–12.0)
Neutro Abs: 4.1 10*3/uL (ref 1.4–7.7)
Neutrophils Relative %: 57.6 % (ref 43.0–77.0)
Platelets: 225 10*3/uL (ref 150.0–400.0)
RBC: 5.23 Mil/uL (ref 4.22–5.81)
RDW: 13.7 % (ref 11.5–15.5)
WBC: 7.1 10*3/uL (ref 4.0–10.5)

## 2014-12-04 LAB — PSA: PSA: 6.46 ng/mL — ABNORMAL HIGH (ref 0.10–4.00)

## 2014-12-04 LAB — COMPREHENSIVE METABOLIC PANEL
ALT: 14 U/L (ref 0–53)
AST: 14 U/L (ref 0–37)
Albumin: 4.3 g/dL (ref 3.5–5.2)
Alkaline Phosphatase: 67 U/L (ref 39–117)
BUN: 18 mg/dL (ref 6–23)
CO2: 26 mEq/L (ref 19–32)
Calcium: 9.5 mg/dL (ref 8.4–10.5)
Chloride: 105 mEq/L (ref 96–112)
Creatinine, Ser: 1 mg/dL (ref 0.40–1.50)
GFR: 76.81 mL/min (ref >60.00)
Glucose, Bld: 114 mg/dL — ABNORMAL HIGH (ref 70–99)
Potassium: 4.8 mEq/L (ref 3.5–5.1)
Sodium: 138 mEq/L (ref 135–145)
Total Bilirubin: 0.9 mg/dL (ref 0.2–1.2)
Total Protein: 6.9 g/dL (ref 6.0–8.3)

## 2014-12-06 ENCOUNTER — Encounter: Payer: Self-pay | Admitting: Family Medicine

## 2014-12-06 ENCOUNTER — Ambulatory Visit (INDEPENDENT_AMBULATORY_CARE_PROVIDER_SITE_OTHER): Payer: Medicare Other | Admitting: Family Medicine

## 2014-12-06 VITALS — BP 144/82 | HR 70 | Temp 97.3°F | Wt 165.5 lb

## 2014-12-06 DIAGNOSIS — I10 Essential (primary) hypertension: Secondary | ICD-10-CM

## 2014-12-06 DIAGNOSIS — R972 Elevated prostate specific antigen [PSA]: Secondary | ICD-10-CM

## 2014-12-06 DIAGNOSIS — Z85820 Personal history of malignant melanoma of skin: Secondary | ICD-10-CM

## 2014-12-06 DIAGNOSIS — E785 Hyperlipidemia, unspecified: Secondary | ICD-10-CM

## 2014-12-06 MED ORDER — SIMVASTATIN 20 MG PO TABS: ORAL_TABLET | ORAL | Status: DC

## 2014-12-06 MED ORDER — SIMVASTATIN 40 MG PO TABS: ORAL_TABLET | ORAL | Status: DC

## 2014-12-06 NOTE — Progress Notes (Signed)
Subjective:   Patient ID: Elijah [name], male    DOB: [birth year], 80 y.o.   MRN: [id number]  Elijah [name] is a pleasant 80 y.o. year old male who presents to clinic today with Follow-up  on 12/06/2014  HPI:  HTN- now taking Accupril 10 mg daily along with Norvasc 10 mg daily (increased last fall). Running 135/75 at home. Lab Results  Component Value Date   CREATININE 1.00 12/04/2014    HLD-  Taking zocor 20 mg daily along with 1000 mg of fish oil daily.  Denies myalgias. Lab Results  Component Value Date   CHOL 164 12/04/2014   HDL 61.90 12/04/2014   LDLCALC 88 12/04/2014   TRIG 71.0 12/04/2014   CHOLHDL 3 12/04/2014   Lab Results  Component Value Date   ALT 14 12/04/2014   AST 14 12/04/2014   ALKPHOS 67 12/04/2014   BILITOT 0.9 12/04/2014   Elevated PSA- followed by Dr. Alinda Money.  Just saw him last week.  PSA is trending down now.  Did have hematuria (gross) which has resolved per pt along with microscopic hematuria. Lab Results  Component Value Date   PSA 6.46* 12/04/2014   PSA 5.16* 02/15/2014   PSA 5.25* 02/06/2013   Had melanoma removed at [facility] earlier this year.  Current Outpatient Prescriptions on File Prior to Visit  Medication Sig Dispense Refill  . albuterol (PROVENTIL HFA) 108 (90 BASE) MCG/ACT inhaler Inhale 2 puffs into the lungs every 4 (four) hours as needed. 1 Inhaler 3  . amLODipine (NORVASC) 10 MG tablet TAKE 1 TABLET BY MOUTH ONCE A DAY 90 tablet 2  . Calcium Carbonate-Vitamin D (TH CALCIUM CARBONATE-VITAMIN D) 600-400 MG-UNIT per tablet Take 1 tablet by mouth daily.      Marland Kitchen FLOVENT HFA 44 MCG/ACT inhaler USE AS DIRECTED AS NEEDED 10.6 g 3  . fluticasone (FLONASE) 50 MCG/ACT nasal spray USE 2 SPRAYS IN EACH NOSTRIL DURING ALLERGY SEASON. 16 g 3  . Omega-3 Fatty Acids (FISH OIL) 1000 MG CAPS Take 1 capsule by mouth daily.      Marland Kitchen PROAIR HFA 108 (90 BASE) MCG/ACT inhaler USE 2 PUFFS 4 TIMES DAILY AS NEEDED 8.5 g 12  . pseudoephedrine (SUDAFED) 30 MG  tablet Take 30 mg by mouth as needed.      . quinapril (ACCUPRIL) 10 MG tablet TAKE 1 TABLET (10 MG TOTAL) BY MOUTH DAILY. 90 tablet 3   No current facility-administered medications on file prior to visit.    Allergies  Allergen Reactions  . Penicillins     Past Medical History  Diagnosis Date  . Allergy   . Asthma   . Hyperlipidemia   . Hypertension   . Elevated PSA     s/p neg biopsy    Past Surgical History  Procedure Laterality Date  . Tonsillectomy    . Prostate biopsy      Family History  Problem Relation Age of Onset  . Stroke Mother     History   Social History  . Marital Status: Married    Spouse Name: N/A  . Number of Children: 1  . Years of Education: N/A   Occupational History  . Retired Event organiser    Social History Main Topics  . Smoking status: Never Smoker   . Smokeless tobacco: Not on file  . Alcohol Use: Yes  . Drug Use: No  . Sexual Activity: Not on file   Other Topics Concern  . Not on file   Social History  Narrative   One biological son, one adopted son, two granddaughters, 89 & 71 yo      Has living will and HPOA- wife Elijah [name].   Would desire CPR.  Would not want prolonged life support.   The PMH, PSH, Social History, Family History, Medications, and allergies have been reviewed in [facility], and have been updated if relevant.    Review of Systems  Constitutional: Negative.   Respiratory: Negative.   Cardiovascular: Negative.   Gastrointestinal: Negative.   Genitourinary: Negative.   Musculoskeletal: Negative.   Skin: Negative.   Neurological: Negative.   Hematological: Negative.   Psychiatric/Behavioral: Negative.   All other systems reviewed and are negative.      Objective:    BP 144/82 mmHg  Pulse 70  Temp(Src) 97.3 F (36.3 C) (Oral)  Wt 165 lb 8 oz (75.07 kg)  SpO2 97%   Physical Exam  Constitutional: He is oriented to person, place, and time. He appears well-developed and well-nourished. No  distress.  HENT:  Head: Normocephalic and atraumatic.  Eyes: Conjunctivae are normal.  Cardiovascular: Normal rate and regular rhythm.   Pulmonary/Chest: Effort normal and breath sounds normal. No respiratory distress. He has no wheezes. He has no rales. He exhibits no tenderness.  Musculoskeletal: He exhibits no edema.  Neurological: He is alert and oriented to person, place, and time. No cranial nerve deficit.  Skin: Skin is warm and dry.  Psychiatric: He has a normal mood and affect. His behavior is normal. Judgment and thought content normal.  Nursing note and vitals reviewed.         Assessment & Plan:   History of melanoma No Follow-up on file.

## 2014-12-06 NOTE — Assessment & Plan Note (Signed)
Well controlled on zocor 20 mg daily. Decrease dose to 20 mg every other day. Recheck labs in 6 months. The patient indicates understanding of these issues and agrees with the plan.

## 2014-12-06 NOTE — Progress Notes (Signed)
Pre visit review using our clinic review tool, if applicable. No additional management support is needed unless otherwise documented below in the visit note. 

## 2014-12-06 NOTE — Assessment & Plan Note (Signed)
Trending down.  Followed by Dr. Alinda Money- notes reviewed.

## 2014-12-06 NOTE — Assessment & Plan Note (Signed)
Well controlled on current rx. No changes made. 

## 2014-12-06 NOTE — Patient Instructions (Signed)
Good to see you.  We are decreasing your simvastatin to 1 tablet every other day.   Please come see me in 6 months.

## 2015-01-18 DIAGNOSIS — L821 Other seborrheic keratosis: Secondary | ICD-10-CM | POA: Diagnosis not present

## 2015-01-18 DIAGNOSIS — Z85820 Personal history of malignant melanoma of skin: Secondary | ICD-10-CM | POA: Diagnosis not present

## 2015-01-18 DIAGNOSIS — D2272 Melanocytic nevi of left lower limb, including hip: Secondary | ICD-10-CM | POA: Diagnosis not present

## 2015-01-18 DIAGNOSIS — D225 Melanocytic nevi of trunk: Secondary | ICD-10-CM | POA: Diagnosis not present

## 2015-02-13 ENCOUNTER — Other Ambulatory Visit: Payer: Self-pay | Admitting: Family Medicine

## 2015-03-28 DIAGNOSIS — H2511 Age-related nuclear cataract, right eye: Secondary | ICD-10-CM | POA: Diagnosis not present

## 2015-04-11 ENCOUNTER — Other Ambulatory Visit: Payer: Self-pay | Admitting: Family Medicine

## 2015-04-22 DIAGNOSIS — H2511 Age-related nuclear cataract, right eye: Secondary | ICD-10-CM | POA: Diagnosis not present

## 2015-04-23 DIAGNOSIS — Z23 Encounter for immunization: Secondary | ICD-10-CM | POA: Diagnosis not present

## 2015-05-01 ENCOUNTER — Encounter: Payer: Self-pay | Admitting: *Deleted

## 2015-05-07 NOTE — Discharge Instructions (Signed)

## 2015-05-08 ENCOUNTER — Ambulatory Visit: Payer: Medicare Other | Admitting: Anesthesiology

## 2015-05-08 ENCOUNTER — Ambulatory Visit
Admission: RE | Admit: 2015-05-08 | Discharge: 2015-05-08 | Disposition: A | Discharge status: Home / Self Care | Payer: Medicare Other | Source: Ambulatory Visit | Attending: Ophthalmology | Admitting: Ophthalmology

## 2015-05-08 ENCOUNTER — Encounter
Admission: RE | Disposition: A | Discharge status: Home / Self Care | Payer: Self-pay | Source: Ambulatory Visit | Attending: Ophthalmology

## 2015-05-08 DIAGNOSIS — I10 Essential (primary) hypertension: Secondary | ICD-10-CM | POA: Diagnosis not present

## 2015-05-08 DIAGNOSIS — H2511 Age-related nuclear cataract, right eye: Secondary | ICD-10-CM | POA: Diagnosis not present

## 2015-05-08 DIAGNOSIS — M1990 Unspecified osteoarthritis, unspecified site: Secondary | ICD-10-CM | POA: Insufficient documentation

## 2015-05-08 DIAGNOSIS — Z880 Allergy status to penicillin: Secondary | ICD-10-CM | POA: Diagnosis not present

## 2015-05-08 DIAGNOSIS — H9193 Unspecified hearing loss, bilateral: Secondary | ICD-10-CM | POA: Insufficient documentation

## 2015-05-08 DIAGNOSIS — N400 Enlarged prostate without lower urinary tract symptoms: Secondary | ICD-10-CM | POA: Insufficient documentation

## 2015-05-08 DIAGNOSIS — J45909 Unspecified asthma, uncomplicated: Secondary | ICD-10-CM | POA: Insufficient documentation

## 2015-05-08 DIAGNOSIS — Z85828 Personal history of other malignant neoplasm of skin: Secondary | ICD-10-CM | POA: Diagnosis not present

## 2015-05-08 DIAGNOSIS — E7800 Pure hypercholesterolemia, unspecified: Secondary | ICD-10-CM | POA: Insufficient documentation

## 2015-05-08 HISTORY — DX: Benign prostatic hyperplasia without lower urinary tract symptoms: N40.0

## 2015-05-08 HISTORY — DX: Presence of external hearing-aid: Z97.4

## 2015-05-08 HISTORY — DX: Unspecified osteoarthritis, unspecified site: M19.90

## 2015-05-08 HISTORY — PX: CATARACT EXTRACTION W/PHACO: SHX586

## 2015-05-08 SURGERY — PHACOEMULSIFICATION, CATARACT, WITH IOL INSERTION
Anesthesia: Monitor Anesthesia Care | Laterality: Right | Wound class: Clean

## 2015-05-08 MED ORDER — POVIDONE-IODINE 5 % OP SOLN
1.0000 "application " | OPHTHALMIC | Status: DC | PRN
  Administered 2015-05-08: 1 via OPHTHALMIC

## 2015-05-08 MED ORDER — BRIMONIDINE TARTRATE 0.2 % OP SOLN
OPHTHALMIC | Status: DC | PRN
  Administered 2015-05-08: 1 [drp] via OPHTHALMIC

## 2015-05-08 MED ORDER — EPINEPHRINE HCL 1 MG/ML IJ SOLN
INTRAMUSCULAR | Status: DC | PRN
  Administered 2015-05-08: 65 mL via OPHTHALMIC

## 2015-05-08 MED ORDER — TIMOLOL MALEATE 0.5 % OP SOLN
OPHTHALMIC | Status: DC | PRN
  Administered 2015-05-08: 1 [drp] via OPHTHALMIC

## 2015-05-08 MED ORDER — NA HYALUR & NA CHOND-NA HYALUR 0.4-0.35 ML IO KIT
PACK | INTRAOCULAR | Status: DC | PRN
  Administered 2015-05-08: 1 mL via INTRAOCULAR

## 2015-05-08 MED ORDER — TETRACAINE HCL 0.5 % OP SOLN
1.0000 [drp] | OPHTHALMIC | Status: DC | PRN
  Administered 2015-05-08: 1 [drp] via OPHTHALMIC

## 2015-05-08 MED ORDER — TETRACAINE HCL 0.5 % OP SOLN
OPHTHALMIC | Status: DC | PRN
  Administered 2015-05-08: 2 [drp] via OPHTHALMIC

## 2015-05-08 MED ORDER — MIDAZOLAM HCL 2 MG/2ML IJ SOLN
INTRAMUSCULAR | Status: DC | PRN
  Administered 2015-05-08: 2 mg via INTRAVENOUS

## 2015-05-08 MED ORDER — ARMC OPHTHALMIC DILATING GEL
1.0000 "application " | OPHTHALMIC | Status: DC | PRN
  Administered 2015-05-08 (×2): 1 via OPHTHALMIC

## 2015-05-08 MED ORDER — MOXIFLOXACIN HCL 0.5 % OP SOLN
OPHTHALMIC | Status: DC | PRN
  Administered 2015-05-08: 1 [drp] via OPHTHALMIC

## 2015-05-08 MED ORDER — FENTANYL CITRATE (PF) 100 MCG/2ML IJ SOLN
INTRAMUSCULAR | Status: DC | PRN
  Administered 2015-05-08: 50 ug via INTRAVENOUS

## 2015-05-08 SURGICAL SUPPLY — 28 items
CANNULA ANT/CHMB 27G (MISCELLANEOUS) ×1 IMPLANT
CANNULA ANT/CHMB 27GA (MISCELLANEOUS) ×2 IMPLANT
GLOVE SURG LX 7.5 STRW (GLOVE) ×1
GLOVE SURG LX STRL 7.5 STRW (GLOVE) ×1 IMPLANT
GLOVE SURG TRIUMPH 8.0 PF LTX (GLOVE) ×2 IMPLANT
GOWN STRL REUS W/ TWL LRG LVL3 (GOWN DISPOSABLE) ×2 IMPLANT
GOWN STRL REUS W/TWL LRG LVL3 (GOWN DISPOSABLE) ×4
LENS IOL TECNIS 18.0 (Intraocular Lens) ×2 IMPLANT
LENS IOL TECNIS MONO 1P 18.0 (Intraocular Lens) IMPLANT
MARKER SKIN SURG W/RULER VIO (MISCELLANEOUS) ×2 IMPLANT
NDL FILTER BLUNT 18X1 1/2 (NEEDLE) ×1 IMPLANT
NDL RETROBULBAR .5 NSTRL (NEEDLE) IMPLANT
NEEDLE FILTER BLUNT 18X 1/2SAF (NEEDLE) ×1
NEEDLE FILTER BLUNT 18X1 1/2 (NEEDLE) ×1 IMPLANT
PACK CATARACT BRASINGTON (MISCELLANEOUS) ×2 IMPLANT
PACK EYE AFTER SURG (MISCELLANEOUS) ×2 IMPLANT
PACK OPTHALMIC (MISCELLANEOUS) ×2 IMPLANT
RING MALYGIN 7.0 (MISCELLANEOUS) IMPLANT
SUT ETHILON 10-0 CS-B-6CS-B-6 (SUTURE)
SUT VICRYL  9 0 (SUTURE)
SUT VICRYL 9 0 (SUTURE) IMPLANT
SUTURE EHLN 10-0 CS-B-6CS-B-6 (SUTURE) IMPLANT
SYR 3ML LL SCALE MARK (SYRINGE) ×2 IMPLANT
SYR 5ML LL (SYRINGE) IMPLANT
SYR TB 1ML LUER SLIP (SYRINGE) ×2 IMPLANT
WATER STERILE IRR 250ML POUR (IV SOLUTION) ×2 IMPLANT
WATER STERILE IRR 500ML POUR (IV SOLUTION) IMPLANT
WIPE NON LINTING 3.25X3.25 (MISCELLANEOUS) ×2 IMPLANT

## 2015-05-08 NOTE — Anesthesia Postprocedure Evaluation (Signed)
  Anesthesia Post-op Note  Patient: Elijah [name]  Procedure(s) Performed: Procedure(s): CATARACT EXTRACTION PHACO AND INTRAOCULAR LENS PLACEMENT (IOC) (Right)  Anesthesia type:MAC  Patient location: PACU  Post pain: Pain level controlled  Post assessment: Post-op Vital signs reviewed, Patient's Cardiovascular Status Stable, Respiratory Function Stable, Patent Airway and No signs of Nausea or vomiting  Post vital signs: Reviewed and stable  Last Vitals:  Filed Vitals:   05/08/15 1000  BP: 131/95  Pulse: 77  Temp: 36.6 C  Resp: 16    Level of consciousness: awake, alert  and patient cooperative  Complications: No apparent anesthesia complications

## 2015-05-08 NOTE — Anesthesia Procedure Notes (Signed)
Procedure Name: MAC Performed by: Jalee Saine Pre-anesthesia Checklist: Patient identified, Emergency Drugs available, Suction available, Timeout performed and Patient being monitored Patient Re-evaluated:Patient Re-evaluated prior to inductionOxygen Delivery Method: Nasal cannula Placement Confirmation: positive ETCO2       

## 2015-05-08 NOTE — Anesthesia Preprocedure Evaluation (Signed)
Anesthesia Evaluation  Patient identified by MRN, date of birth, ID band Patient awake    Reviewed: Allergy & Precautions, NPO status , Patient's Chart, lab work & pertinent test results, reviewed documented beta blocker date and time   Airway Mallampati: II  TM Distance: >3 FB Neck ROM: Full    Dental no notable dental hx.    Pulmonary asthma ,    Pulmonary exam normal        Cardiovascular hypertension, Normal cardiovascular exam     Neuro/Psych negative neurological ROS     GI/Hepatic negative GI ROS, Neg liver ROS,   Endo/Other  negative endocrine ROS  Renal/GU negative Renal ROS     Musculoskeletal  (+) Arthritis , Osteoarthritis,    Abdominal   Peds  Hematology negative hematology ROS (+)   Anesthesia Other Findings   Reproductive/Obstetrics                             Anesthesia Physical Anesthesia Plan  ASA: II  Anesthesia Plan: MAC   Post-op Pain Management:    Induction: Intravenous  Airway Management Planned:   Additional Equipment:   Intra-op Plan:   Post-operative Plan:   Informed Consent: I have reviewed the patients History and Physical, chart, labs and discussed the procedure including the risks, benefits and alternatives for the proposed anesthesia with the patient or authorized representative who has indicated his/her understanding and acceptance.     Plan Discussed with: CRNA  Anesthesia Plan Comments:         Anesthesia Quick Evaluation

## 2015-05-08 NOTE — Transfer of Care (Signed)
Immediate Anesthesia Transfer of Care Note  Patient: Elijah [name]  Procedure(s) Performed: Procedure(s): CATARACT EXTRACTION PHACO AND INTRAOCULAR LENS PLACEMENT (IOC) (Right)  Patient Location: PACU  Anesthesia Type: MAC  Level of Consciousness: awake, alert  and patient cooperative  Airway and Oxygen Therapy: Patient Spontanous Breathing and Patient connected to supplemental oxygen  Post-op Assessment: Post-op Vital signs reviewed, Patient's Cardiovascular Status Stable, Respiratory Function Stable, Patent Airway and No signs of Nausea or vomiting  Post-op Vital Signs: Reviewed and stable  Complications: No apparent anesthesia complications

## 2015-05-08 NOTE — H&P (Signed)
  The History and Physical notes are on paper, have been signed, and are to be scanned. The patient remains stable and unchanged from the H&P.   Previous H&P reviewed, patient examined, and there are no changes.  [name] 05/08/2015 8:50 AM

## 2015-05-08 NOTE — Op Note (Signed)
LOCATION:  [place]   PREOPERATIVE DIAGNOSIS:    Nuclear sclerotic cataract right eye. H25.11   POSTOPERATIVE DIAGNOSIS:  Nuclear sclerotic cataract right eye.     PROCEDURE:  Phacoemusification with posterior chamber intraocular lens placement of the right eye   LENS:   Implant Name Type Inv. Item Serial No. Manufacturer Lot No. LRB No. Used  LENS IMPL INTRAOC [id number] 18.0 - [id number] Intraocular Lens LENS IMPL INTRAOC ZCB00 18.0 [id number] AMO   Right 1        ULTRASOUND TIME: 15 % of 1 minutes, 31 seconds.  CDE 13.6   SURGEON:  Wyonia Hough, MD   ANESTHESIA:  Topical with tetracaine drops and 2% Xylocaine jelly.   COMPLICATIONS:  None.   DESCRIPTION OF PROCEDURE:  The patient was identified in the holding room and transported to the operating room and placed in the supine position under the operating microscope.  The right eye was identified as the operative eye and it was prepped and draped in the usual sterile ophthalmic fashion.   A 1 millimeter clear-corneal paracentesis was made at the 12:00 position.  The anterior chamber was filled with Viscoat viscoelastic.  A 2.4 millimeter keratome was used to make a near-clear corneal incision at the 9:00 position.  A curvilinear capsulorrhexis was made with a cystotome and capsulorrhexis forceps.  Balanced salt solution was used to hydrodissect and hydrodelineate the nucleus.   Phacoemulsification was then used in stop and chop fashion to remove the lens nucleus and epinucleus.  The remaining cortex was then removed using the irrigation and aspiration handpiece. Provisc was then placed into the capsular bag to distend it for lens placement.  A lens was then injected into the capsular bag.  The remaining viscoelastic was aspirated.   Wounds were hydrated with balanced salt solution.  The anterior chamber was inflated to a physiologic pressure with balanced salt solution.  No wound leaks were noted. Vigamox 0.2 ml of a  1mg  per ml solution was injected into the anterior chamber for a dose of 0.2 mg of intracameral antibiotic at the completion of the case.   Timolol and Brimonidine drops were applied to the eye.  The patient was taken to the recovery room in stable condition without complications of anesthesia or surgery.   Sricharan Lacomb 05/08/2015, 9:57 AM

## 2015-05-09 ENCOUNTER — Encounter: Payer: Self-pay | Admitting: Ophthalmology

## 2015-05-11 ENCOUNTER — Other Ambulatory Visit: Payer: Self-pay | Admitting: Family Medicine

## 2015-05-28 ENCOUNTER — Other Ambulatory Visit: Payer: Self-pay | Admitting: Family Medicine

## 2015-05-28 DIAGNOSIS — E785 Hyperlipidemia, unspecified: Secondary | ICD-10-CM

## 2015-05-28 DIAGNOSIS — R972 Elevated prostate specific antigen [PSA]: Secondary | ICD-10-CM

## 2015-05-28 DIAGNOSIS — I10 Essential (primary) hypertension: Secondary | ICD-10-CM

## 2015-06-03 ENCOUNTER — Other Ambulatory Visit (INDEPENDENT_AMBULATORY_CARE_PROVIDER_SITE_OTHER): Payer: Medicare Other

## 2015-06-03 DIAGNOSIS — R972 Elevated prostate specific antigen [PSA]: Secondary | ICD-10-CM

## 2015-06-03 DIAGNOSIS — E785 Hyperlipidemia, unspecified: Secondary | ICD-10-CM

## 2015-06-03 LAB — CBC WITH DIFFERENTIAL/PLATELET
Basophils Absolute: 0 10*3/uL (ref 0.0–0.1)
Basophils Relative: 0.6 % (ref 0.0–3.0)
Eosinophils Absolute: 0.3 10*3/uL (ref 0.0–0.7)
Eosinophils Relative: 4.5 % (ref 0.0–5.0)
HCT: 48.9 % (ref 39.0–52.0)
Hemoglobin: 16.4 g/dL (ref 13.0–17.0)
Lymphocytes Relative: 27 % (ref 12.0–46.0)
Lymphs Abs: 2 10*3/uL (ref 0.7–4.0)
MCHC: 33.5 g/dL (ref 30.0–36.0)
MCV: 88.5 fl (ref 78.0–100.0)
Monocytes Absolute: 0.9 10*3/uL (ref 0.1–1.0)
Monocytes Relative: 11.9 % (ref 3.0–12.0)
Neutro Abs: 4.2 10*3/uL (ref 1.4–7.7)
Neutrophils Relative %: 56 % (ref 43.0–77.0)
Platelets: 231 10*3/uL (ref 150.0–400.0)
RBC: 5.53 Mil/uL (ref 4.22–5.81)
RDW: 13.2 % (ref 11.5–15.5)
WBC: 7.6 10*3/uL (ref 4.0–10.5)

## 2015-06-03 LAB — LIPID PANEL
Cholesterol: 152 mg/dL (ref 0–200)
HDL: 49.2 mg/dL (ref >39.00)
LDL Cholesterol: 88 mg/dL (ref 0–99)
NonHDL: 102.78
Total CHOL/HDL Ratio: 3
Triglycerides: 75 mg/dL (ref 0.0–149.0)
VLDL: 15 mg/dL (ref 0.0–40.0)

## 2015-06-03 LAB — COMPREHENSIVE METABOLIC PANEL
ALT: 29 U/L (ref 0–53)
AST: 25 U/L (ref 0–37)
Albumin: 4 g/dL (ref 3.5–5.2)
Alkaline Phosphatase: 66 U/L (ref 39–117)
BUN: 21 mg/dL (ref 6–23)
CO2: 26 mEq/L (ref 19–32)
Calcium: 9.4 mg/dL (ref 8.4–10.5)
Chloride: 107 mEq/L (ref 96–112)
Creatinine, Ser: 1.08 mg/dL (ref 0.40–1.50)
GFR: 70.19 mL/min (ref >60.00)
Glucose, Bld: 112 mg/dL — ABNORMAL HIGH (ref 70–99)
Potassium: 4.7 mEq/L (ref 3.5–5.1)
Sodium: 141 mEq/L (ref 135–145)
Total Bilirubin: 1.1 mg/dL (ref 0.2–1.2)
Total Protein: 7.2 g/dL (ref 6.0–8.3)

## 2015-06-04 LAB — PSA: PSA: 7.55

## 2015-06-07 DIAGNOSIS — R972 Elevated prostate specific antigen [PSA]: Secondary | ICD-10-CM | POA: Diagnosis not present

## 2015-06-07 DIAGNOSIS — R310 Gross hematuria: Secondary | ICD-10-CM | POA: Diagnosis not present

## 2015-06-07 DIAGNOSIS — N5201 Erectile dysfunction due to arterial insufficiency: Secondary | ICD-10-CM | POA: Diagnosis not present

## 2015-06-10 ENCOUNTER — Encounter: Payer: Self-pay | Admitting: Family Medicine

## 2015-06-10 ENCOUNTER — Ambulatory Visit (INDEPENDENT_AMBULATORY_CARE_PROVIDER_SITE_OTHER): Payer: Medicare Other | Admitting: Family Medicine

## 2015-06-10 VITALS — BP 132/74 | HR 67 | Temp 97.9°F | Ht 65.75 in | Wt 161.5 lb

## 2015-06-10 DIAGNOSIS — E785 Hyperlipidemia, unspecified: Secondary | ICD-10-CM

## 2015-06-10 DIAGNOSIS — I10 Essential (primary) hypertension: Secondary | ICD-10-CM

## 2015-06-10 DIAGNOSIS — E559 Vitamin D deficiency, unspecified: Secondary | ICD-10-CM

## 2015-06-10 DIAGNOSIS — R972 Elevated prostate specific antigen [PSA]: Secondary | ICD-10-CM

## 2015-06-10 DIAGNOSIS — Z0000 Encounter for general adult medical examination without abnormal findings: Secondary | ICD-10-CM | POA: Diagnosis not present

## 2015-06-10 MED ORDER — SIMVASTATIN 10 MG PO TABS: ORAL_TABLET | ORAL | Status: DC

## 2015-06-10 NOTE — Assessment & Plan Note (Signed)
Well controlled.  No changes made. 

## 2015-06-10 NOTE — Progress Notes (Signed)
Pre visit review using our clinic review tool, if applicable. No additional management support is needed unless otherwise documented below in the visit note. 

## 2015-06-10 NOTE — Patient Instructions (Signed)
Great to see you. Happy Holidays. Let's decrease your simvastatin to 10 mg every other day.

## 2015-06-10 NOTE — Assessment & Plan Note (Signed)
Well controlled. Continue to decrease dose- 10 mg every other day. The patient indicates understanding of these issues and agrees with the plan.

## 2015-06-10 NOTE — Assessment & Plan Note (Signed)
The patients weight, height, BMI and visual acuity have been recorded in the chart.  Cognitive function assessed.   I have made referrals, counseling and provided education to the patient based review of the above and I have provided the pt with a written personalized care plan for preventive services.  

## 2015-06-10 NOTE — Assessment & Plan Note (Signed)
Benign. Followed by urology. Will continue to follow.

## 2015-06-10 NOTE — Progress Notes (Signed)
Very pleasant 80 yo here for medicare annual wellness visit.  I have personally reviewed the Medicare Annual Wellness questionnaire and have noted 1. The patient's medical and social history 2. Their use of alcohol, tobacco or illicit drugs 3. Their current medications and supplements 4. The patient's functional ability including ADL's, fall risks, home safety risks and hearing or visual             impairment. 5. Diet and physical activities 6. Evidence for depression or mood disorders  End of life wishes discussed and updated in Social History.  The roster of all physicians providing medical care to patient - is listed in the Snapshot section of the chart.  Had colonoscopy at 56, told he did not need another one again.   Neg IFOB 02/21/14 Pneumovax 09/07/07 prevnar 13 02/19/14 Td 10/31/08 Zoster 09/07/07 Dilated eye exam 05/16/15 Influenza vaccine 04/29/15 Derm exam - 01/18/15  Hyperglycemia-  FSBS elevated again this month but improved.  Previous a1cs have been reassuring.  Denies any family h/o DM.   Denies any increased thirst or urination.  Lab Results  Component Value Date   HGBA1C 5.2 02/06/2013    HTN- has been on Amlodipine 10 mg dan Quinipril 10 mg daily.  No CP, SOB, blurred vision.   HLD- well controlled on lower dose of simvastatin.  Lab Results  Component Value Date   CHOL 152 06/03/2015   HDL 49.20 06/03/2015   LDLCALC 88 06/03/2015   TRIG 75.0 06/03/2015   CHOLHDL 3 06/03/2015    Elevated PSA- followed by Dr. Alinda Money.  Has had two biopsies that were both neg.  Last appt with Dr. Karie Georges 06/07/15. Did have some gross hematuria- negative work up including CT and cystoscopy. Lab Results  Component Value Date   PSA 6.46* 12/04/2014   PSA 5.16* 02/15/2014   PSA 5.25* 02/06/2013    Patient Active Problem List   Diagnosis Date Noted  . Medicare annual wellness visit, subsequent 06/10/2015  . History of melanoma 12/06/2014  . Arthritis 02/19/2014  .  Elevated PSA 01/28/2012  . Hyperglycemia 01/20/2011  . Vitamin D deficiency 11/04/2009  . HLD (hyperlipidemia) 11/04/2009  . Essential hypertension 11/04/2009  . ALLERGIC RHINITIS 11/04/2009  . ASTHMA 11/04/2009   Past Medical History  Diagnosis Date  . Allergy   . Asthma   . Hyperlipidemia   . Hypertension   . Elevated PSA     s/p neg biopsy  . Enlarged prostate   . Arthritis     hands  . Wears hearing aid    Past Surgical History  Procedure Laterality Date  . Tonsillectomy    . Prostate biopsy    . Cataract extraction w/phaco Right 05/08/2015    Procedure: CATARACT EXTRACTION PHACO AND INTRAOCULAR LENS PLACEMENT (IOC);  Surgeon: Leandrew Koyanagi, MD;  Location: [place];  Service: Ophthalmology;  Laterality: Right;   Social History  Substance Use Topics  . Smoking status: Never Smoker   . Smokeless tobacco: None  . Alcohol Use: 8.4 oz/week    14 Shots of liquor per week   Family History  Problem Relation Age of Onset  . Stroke Mother    Allergies  Allergen Reactions  . Penicillins Hives   Current Outpatient Prescriptions on File Prior to Visit  Medication Sig Dispense Refill  . albuterol (PROVENTIL HFA) 108 (90 BASE) MCG/ACT inhaler Inhale 2 puffs into the lungs every 4 (four) hours as needed. 1 Inhaler 3  . amLODipine (NORVASC) 10 MG tablet  TAKE 1 TABLET BY MOUTH ONCE A DAY 90 tablet 2  . Calcium Carbonate-Vitamin D (TH CALCIUM CARBONATE-VITAMIN D) 600-400 MG-UNIT per tablet Take 1 tablet by mouth daily.      . fluticasone (FLONASE) 50 MCG/ACT nasal spray USE 2 SPRAYS IN EACH NOSTRIL DURING ALLERGY SEASON. 16 g 3  . Omega-3 Fatty Acids (FISH OIL) 1000 MG CAPS Take 1 capsule by mouth daily.      Marland Kitchen PROAIR HFA 108 (90 BASE) MCG/ACT inhaler INHALE 2 PUFFS EVERY 4 HOURS AS NEEDED 8.5 Inhaler 0  . pseudoephedrine (SUDAFED) 30 MG tablet Take 30 mg by mouth as needed.      . quinapril (ACCUPRIL) 10 MG tablet TAKE 1 TABLET (10 MG TOTAL) BY MOUTH DAILY. 90  tablet 1  . simvastatin (ZOCOR) 20 MG tablet TAKE 1 TABLET (20 MG TOTAL) BY MOUTH EVERY OTHER DAY at BEDTIME 903 tablet 3   No current facility-administered medications on file prior to visit.   The PMH, PSH, Social History, Family History, Medications, and allergies have been reviewed in [facility], and have been updated if relevant.   Review of Systems  Constitutional: Negative.   HENT: Negative.   Eyes: Negative.   Respiratory: Negative.   Gastrointestinal: Negative.   Endocrine: Negative.   Genitourinary: Negative.   Musculoskeletal: Negative.   Skin: Negative.   Allergic/Immunologic: Negative.   Neurological: Negative.   Hematological: Negative.   Psychiatric/Behavioral: Negative.   All other systems reviewed and are negative.    Physical Exam BP 132/74 mmHg  Pulse 67  Temp(Src) 97.9 F (36.6 C) (Oral)  Ht 5' 5.75" (1.67 m)  Wt 161 lb 8 oz (73.256 kg)  BMI 26.27 kg/m2  SpO2 97% Wt Readings from Last 3 Encounters:  06/10/15 161 lb 8 oz (73.256 kg)  05/08/15 165 lb (74.844 kg)  12/06/14 165 lb 8 oz (75.07 kg)   `  General:  Well-developed,well-nourished,in no acute distress; alert,appropriate and cooperative throughout examination Head:  Normocephalic and atraumatic without obvious abnormalities. No apparent alopecia or balding. Eyes:  vision grossly intact, pupils equal, pupils round, and pupils reactive to light.   Ears:  R ear normal and L ear normal.   Nose:  no external deformity.   Mouth:  MMM Lungs:  Normal respiratory effort, chest expands symmetrically. Lungs are clear to auscultation, no crackles or wheezes. Heart:  Normal rate and regular rhythm. S1 and S2 normal without gallop, murmur, click, rub or other extra sounds. Abdomen:  Bowel sounds positive,abdomen soft and non-tender without masses, organomegaly or hernias noted. Extremities:  no edema Skin:  seborrheic keratosis.  Psych:  memory intact for recent and remote and normally interactive.

## 2015-06-26 DIAGNOSIS — R918 Other nonspecific abnormal finding of lung field: Secondary | ICD-10-CM | POA: Diagnosis not present

## 2015-06-26 DIAGNOSIS — J069 Acute upper respiratory infection, unspecified: Secondary | ICD-10-CM | POA: Diagnosis not present

## 2015-06-26 DIAGNOSIS — R062 Wheezing: Secondary | ICD-10-CM | POA: Diagnosis not present

## 2015-07-26 DIAGNOSIS — Z85820 Personal history of malignant melanoma of skin: Secondary | ICD-10-CM | POA: Diagnosis not present

## 2015-07-26 DIAGNOSIS — L570 Actinic keratosis: Secondary | ICD-10-CM | POA: Diagnosis not present

## 2015-07-26 DIAGNOSIS — D485 Neoplasm of uncertain behavior of skin: Secondary | ICD-10-CM | POA: Diagnosis not present

## 2015-07-26 DIAGNOSIS — L821 Other seborrheic keratosis: Secondary | ICD-10-CM | POA: Diagnosis not present

## 2015-07-26 DIAGNOSIS — Z08 Encounter for follow-up examination after completed treatment for malignant neoplasm: Secondary | ICD-10-CM | POA: Diagnosis not present

## 2015-08-07 ENCOUNTER — Other Ambulatory Visit: Payer: Self-pay | Admitting: Family Medicine

## 2015-08-09 DIAGNOSIS — C44519 Basal cell carcinoma of skin of other part of trunk: Secondary | ICD-10-CM | POA: Diagnosis not present

## 2015-09-14 ENCOUNTER — Other Ambulatory Visit: Payer: Self-pay | Admitting: Family Medicine

## 2015-12-16 DIAGNOSIS — R972 Elevated prostate specific antigen [PSA]: Secondary | ICD-10-CM | POA: Diagnosis not present

## 2015-12-16 LAB — PSA: PSA: 5.95

## 2015-12-20 DIAGNOSIS — R972 Elevated prostate specific antigen [PSA]: Secondary | ICD-10-CM | POA: Diagnosis not present

## 2015-12-20 DIAGNOSIS — H2512 Age-related nuclear cataract, left eye: Secondary | ICD-10-CM | POA: Diagnosis not present

## 2016-01-22 ENCOUNTER — Other Ambulatory Visit: Payer: Self-pay | Admitting: Family Medicine

## 2016-01-23 ENCOUNTER — Other Ambulatory Visit: Payer: Self-pay | Admitting: Family Medicine

## 2016-01-23 DIAGNOSIS — Z85828 Personal history of other malignant neoplasm of skin: Secondary | ICD-10-CM | POA: Diagnosis not present

## 2016-01-23 DIAGNOSIS — L821 Other seborrheic keratosis: Secondary | ICD-10-CM | POA: Diagnosis not present

## 2016-01-23 DIAGNOSIS — L910 Hypertrophic scar: Secondary | ICD-10-CM | POA: Diagnosis not present

## 2016-01-23 DIAGNOSIS — Z85820 Personal history of malignant melanoma of skin: Secondary | ICD-10-CM | POA: Diagnosis not present

## 2016-01-29 ENCOUNTER — Ambulatory Visit (INDEPENDENT_AMBULATORY_CARE_PROVIDER_SITE_OTHER): Payer: Medicare Other | Admitting: Family Medicine

## 2016-01-29 DIAGNOSIS — I10 Essential (primary) hypertension: Secondary | ICD-10-CM

## 2016-01-29 DIAGNOSIS — Z85820 Personal history of malignant melanoma of skin: Secondary | ICD-10-CM

## 2016-01-29 DIAGNOSIS — R972 Elevated prostate specific antigen [PSA]: Secondary | ICD-10-CM

## 2016-01-29 DIAGNOSIS — E785 Hyperlipidemia, unspecified: Secondary | ICD-10-CM

## 2016-01-29 NOTE — Assessment & Plan Note (Deleted)
Benign. Followed by urology.

## 2016-01-29 NOTE — Progress Notes (Deleted)
Very pleasant 81 yo pleasant male here for follow up.    HTN- has been on Amlodipine 10 mg dan Quinipril 10 mg daily.  No CP, SOB, blurred vision.   Lab Results  Component Value Date   CREATININE 1.08 06/03/2015     HLD- well controlled on lower dose of simvastatin.  Lab Results  Component Value Date   CHOL 152 06/03/2015   HDL 49.20 06/03/2015   LDLCALC 88 06/03/2015   TRIG 75.0 06/03/2015   CHOLHDL 3 06/03/2015    Elevated PSA- followed by Dr. Alinda Money.  Has had two biopsies that were both neg.   Did have some gross hematuria- negative work up including CT and cystoscopy. Lab Results  Component Value Date   PSA 6.46 (H) 12/04/2014   PSA 5.16 (H) 02/15/2014   PSA 5.25 (H) 02/06/2013    Patient Active Problem List  Diagnosis  . Vitamin D deficiency  . HLD (hyperlipidemia)  . Essential hypertension  . ALLERGIC RHINITIS  . ASTHMA  . Hyperglycemia  . Elevated PSA  . Arthritis  . History of melanoma   Past Medical History:  Diagnosis Date  . Allergy   . Arthritis    hands  . Asthma   . Elevated PSA    s/p neg biopsy  . Enlarged prostate   . Hyperlipidemia   . Hypertension   . Wears hearing aid    Past Surgical History:  Procedure Laterality Date  . CATARACT EXTRACTION W/PHACO Right 05/08/2015   Procedure: CATARACT EXTRACTION PHACO AND INTRAOCULAR LENS PLACEMENT (IOC);  Surgeon: Leandrew Koyanagi, MD;  Location: [place];  Service: Ophthalmology;  Laterality: Right;  . PROSTATE BIOPSY    . TONSILLECTOMY     Social History  Substance Use Topics  . Smoking status: Never Smoker  . Smokeless tobacco: Not on file  . Alcohol use 8.4 oz/week    14 Shots of liquor per week   Family History  Problem Relation Age of Onset  . Stroke Mother    Allergies  Allergen Reactions  . Penicillins Hives   Current Outpatient Prescriptions on File Prior to Visit  Medication Sig Dispense Refill  . albuterol (PROVENTIL HFA) 108 (90 BASE) MCG/ACT inhaler  Inhale 2 puffs into the lungs every 4 (four) hours as needed. 1 Inhaler 3  . amLODipine (NORVASC) 10 MG tablet TAKE 1 TABLET BY MOUTH ONCE A DAY 90 tablet 0  . Calcium Carbonate-Vitamin D (TH CALCIUM CARBONATE-VITAMIN D) 600-400 MG-UNIT per tablet Take 1 tablet by mouth daily.      . fluticasone (FLONASE) 50 MCG/ACT nasal spray USE 2 SPRAYS IN EACH NOSTRIL DURING ALLERGY SEASON. 16 g 6  . Omega-3 Fatty Acids (FISH OIL) 1000 MG CAPS Take 1 capsule by mouth daily.      Marland Kitchen PROAIR HFA 108 (90 BASE) MCG/ACT inhaler INHALE 2 PUFFS EVERY 4 HOURS AS NEEDED 8.5 Inhaler 0  . pseudoephedrine (SUDAFED) 30 MG tablet Take 30 mg by mouth as needed.      . quinapril (ACCUPRIL) 10 MG tablet TAKE 1 TABLET (10 MG TOTAL) BY MOUTH DAILY. 90 tablet 1  . simvastatin (ZOCOR) 10 MG tablet 1 tablet by mouth daily. 90 tablet 3   No current facility-administered medications on file prior to visit.    The PMH, PSH, Social History, Family History, Medications, and allergies have been reviewed in [facility], and have been updated if relevant.   Review of Systems  Constitutional: Negative.   HENT: Negative.   Eyes: Negative.  Respiratory: Negative.   Gastrointestinal: Negative.   Endocrine: Negative.   Genitourinary: Negative.   Musculoskeletal: Negative.   Skin: Negative.   Allergic/Immunologic: Negative.   Neurological: Negative.   Hematological: Negative.   Psychiatric/Behavioral: Negative.   All other systems reviewed and are negative.    Physical Exam There were no vitals taken for this visit. Wt Readings from Last 3 Encounters:  06/10/15 161 lb 8 oz (73.3 kg)  05/08/15 165 lb (74.8 kg)  12/06/14 165 lb 8 oz (75.1 kg)   `  General:  Well-developed,well-nourished,in no acute distress; alert,appropriate and cooperative throughout examination Head:  Normocephalic and atraumatic without obvious abnormalities. No apparent alopecia or balding. Eyes:  vision grossly intact, pupils equal, pupils round, and pupils  reactive to light.   Ears:  R ear normal and L ear normal.   Nose:  no external deformity.   Mouth:  MMM Lungs:  Normal respiratory effort, chest expands symmetrically. Lungs are clear to auscultation, no crackles or wheezes. Heart:  Normal rate and regular rhythm. S1 and S2 normal without gallop, murmur, click, rub or other extra sounds. Abdomen:  Bowel sounds positive,abdomen soft and non-tender without masses, organomegaly or hernias noted. Extremities:  no edema Skin:  seborrheic keratosis.  Psych:  memory intact for recent and remote and normally interactive.

## 2016-01-29 NOTE — Assessment & Plan Note (Deleted)
Well controlled on current dose of simvastatin. No changes made today.

## 2016-01-29 NOTE — Progress Notes (Signed)
Appt cancelled

## 2016-01-29 NOTE — Assessment & Plan Note (Deleted)
Normotensive. No changes made today. 

## 2016-01-30 ENCOUNTER — Other Ambulatory Visit: Payer: Self-pay | Admitting: Family Medicine

## 2016-03-16 ENCOUNTER — Other Ambulatory Visit: Payer: Self-pay | Admitting: Family Medicine

## 2016-04-06 DIAGNOSIS — Z23 Encounter for immunization: Secondary | ICD-10-CM | POA: Diagnosis not present

## 2016-04-17 ENCOUNTER — Other Ambulatory Visit: Payer: Self-pay | Admitting: Family Medicine

## 2016-04-23 ENCOUNTER — Other Ambulatory Visit: Payer: Self-pay | Admitting: Family Medicine

## 2016-04-26 ENCOUNTER — Other Ambulatory Visit: Payer: Self-pay | Admitting: Family Medicine

## 2016-05-20 DIAGNOSIS — J4521 Mild intermittent asthma with (acute) exacerbation: Secondary | ICD-10-CM | POA: Diagnosis not present

## 2016-05-20 DIAGNOSIS — Z6825 Body mass index (BMI) 25.0-25.9, adult: Secondary | ICD-10-CM | POA: Diagnosis not present

## 2016-06-04 ENCOUNTER — Other Ambulatory Visit: Payer: Self-pay

## 2016-06-09 ENCOUNTER — Other Ambulatory Visit: Payer: Self-pay | Admitting: Family Medicine

## 2016-06-09 DIAGNOSIS — E7800 Pure hypercholesterolemia, unspecified: Secondary | ICD-10-CM

## 2016-06-09 DIAGNOSIS — R972 Elevated prostate specific antigen [PSA]: Secondary | ICD-10-CM

## 2016-06-11 ENCOUNTER — Ambulatory Visit (INDEPENDENT_AMBULATORY_CARE_PROVIDER_SITE_OTHER): Payer: Medicare Other

## 2016-06-11 ENCOUNTER — Other Ambulatory Visit: Payer: Medicare Other

## 2016-06-11 VITALS — BP 126/80 | HR 63 | Temp 97.8°F | Ht 65.5 in | Wt 159.5 lb

## 2016-06-11 DIAGNOSIS — Z0000 Encounter for general adult medical examination without abnormal findings: Secondary | ICD-10-CM

## 2016-06-11 DIAGNOSIS — Z125 Encounter for screening for malignant neoplasm of prostate: Secondary | ICD-10-CM

## 2016-06-11 DIAGNOSIS — E7800 Pure hypercholesterolemia, unspecified: Secondary | ICD-10-CM

## 2016-06-11 LAB — COMPREHENSIVE METABOLIC PANEL
ALT: 21 U/L (ref 0–53)
AST: 18 U/L (ref 0–37)
Albumin: 4.3 g/dL (ref 3.5–5.2)
Alkaline Phosphatase: 65 U/L (ref 39–117)
BUN: 14 mg/dL (ref 6–23)
CO2: 29 mEq/L (ref 19–32)
Calcium: 9.5 mg/dL (ref 8.4–10.5)
Chloride: 105 mEq/L (ref 96–112)
Creatinine, Ser: 0.94 mg/dL (ref 0.40–1.50)
GFR: 82.17 mL/min (ref >60.00)
Glucose, Bld: 117 mg/dL — ABNORMAL HIGH (ref 70–99)
Potassium: 4.7 mEq/L (ref 3.5–5.1)
Sodium: 140 mEq/L (ref 135–145)
Total Bilirubin: 1.2 mg/dL (ref 0.2–1.2)
Total Protein: 7.5 g/dL (ref 6.0–8.3)

## 2016-06-11 LAB — LIPID PANEL
Cholesterol: 206 mg/dL — ABNORMAL HIGH (ref 0–200)
HDL: 66.1 mg/dL (ref >39.00)
LDL Cholesterol: 123 mg/dL — ABNORMAL HIGH (ref 0–99)
NonHDL: 139.95
Total CHOL/HDL Ratio: 3
Triglycerides: 86 mg/dL (ref 0.0–149.0)
VLDL: 17.2 mg/dL (ref 0.0–40.0)

## 2016-06-11 NOTE — Patient Instructions (Signed)
Elijah [name] , Thank you for taking time to come for your Medicare Wellness Visit. I appreciate your ongoing commitment to your health goals. Please review the following plan we discussed and let me know if I can assist you in the future.   These are the goals we discussed: Goals    . Increase physical activity          Starting 06/15/16, I will continue to exercise at least 60 min 4 days per week.        This is a list of the screening recommended for you and due dates:  Health Maintenance  Topic Date Due  . Tetanus Vaccine  11/01/2018  . Flu Shot  Addressed  . Shingles Vaccine  Completed  . Pneumonia vaccines  Completed   Preventive Care for Adults  A healthy lifestyle and preventive care can promote health and wellness. Preventive health guidelines for adults include the following key practices.  . A routine yearly physical is a good way to check with your health care provider about your health and preventive screening. It is a chance to share any concerns and updates on your health and to receive a thorough exam.  . Visit your dentist for a routine exam and preventive care every 6 months. Brush your teeth twice a day and floss once a day. Good oral hygiene prevents tooth decay and gum disease.  . The frequency of eye exams is based on your age, health, family medical history, use  of contact lenses, and other factors. Follow your health care provider's ecommendations for frequency of eye exams.  . Eat a healthy diet. Foods like vegetables, fruits, whole grains, low-fat dairy products, and lean protein foods contain the nutrients you need without too many calories. Decrease your intake of foods high in solid fats, added sugars, and salt. Eat the right amount of calories for you. Get information about a proper diet from your health care provider, if necessary.  . Regular physical exercise is one of the most important things you can do for your health. Most adults should get at least  150 minutes of moderate-intensity exercise (any activity that increases your heart rate and causes you to sweat) each week. In addition, most adults need muscle-strengthening exercises on 2 or more days a week.  Silver Sneakers may be a benefit available to you. To determine eligibility, you may visit the website: www.silversneakers.com or contact program at 816-435-6589 Mon-Fri between 8AM-8PM.   . Maintain a healthy weight. The body mass index (BMI) is a screening tool to identify possible weight problems. It provides an estimate of body fat based on height and weight. Your health care provider can find your BMI and can help you achieve or maintain a healthy weight.   For adults 20 years and older: ? A BMI below 18.5 is considered underweight. ? A BMI of 18.5 to 24.9 is normal. ? A BMI of 25 to 29.9 is considered overweight. ? A BMI of 30 and above is considered obese.   . Maintain normal blood lipids and cholesterol levels by exercising and minimizing your intake of saturated fat. Eat a balanced diet with plenty of fruit and vegetables. Blood tests for lipids and cholesterol should begin at age 34 and be repeated every 5 years. If your lipid or cholesterol levels are high, you are over 50, or you are at high risk for heart disease, you may need your cholesterol levels checked more frequently. Ongoing high lipid and cholesterol levels should  be treated with medicines if diet and exercise are not working.  . If you smoke, find out from your health care provider how to quit. If you do not use tobacco, please do not start.  . If you choose to drink alcohol, please do not consume more than 2 drinks per day. One drink is considered to be 12 ounces (355 mL) of beer, 5 ounces (148 mL) of wine, or 1.5 ounces (44 mL) of liquor.  . If you are 28-68 years old, ask your health care provider if you should take aspirin to prevent strokes.  . Use sunscreen. Apply sunscreen liberally and repeatedly  throughout the day. You should seek shade when your shadow is shorter than you. Protect yourself by wearing long sleeves, pants, a wide-brimmed hat, and sunglasses year round, whenever you are outdoors.  . Once a month, do a whole body skin exam, using a mirror to look at the skin on your back. Tell your health care provider of new moles, moles that have irregular borders, moles that are larger than a pencil eraser, or moles that have changed in shape or color.

## 2016-06-11 NOTE — Progress Notes (Signed)
I reviewed health advisor's note, was available for consultation, and agree with documentation and plan.  

## 2016-06-11 NOTE — Progress Notes (Signed)
PCP notes:   Health maintenance:  Flu vaccine - per pt report, administered on 04/06/2016  Abnormal screenings:   None  Patient concerns:   None  Nurse concerns:  None  Next PCP appt:   06/15/16 @ 1000

## 2016-06-11 NOTE — Progress Notes (Signed)
Pre visit review using our clinic review tool, if applicable. No additional management support is needed unless otherwise documented below in the visit note. 

## 2016-06-11 NOTE — Progress Notes (Signed)
Subjective:   Elijah [name] is a 81 y.o. male who presents for Medicare Annual/Subsequent preventive examination.  Review of Systems:  N/A Cardiac Risk Factors include: advanced age (>59men, >79 women);male gender;dyslipidemia;hypertension     Objective:    Vitals: BP 126/80 (BP Location: Left Arm, Patient Position: Sitting, Cuff Size: Normal)   Pulse 63   Temp 97.8 F (36.6 C)   Ht 5' 5.5" (1.664 m) Comment: no shoes  Wt 159 lb 8 oz (72.3 kg)   SpO2 97%   BMI 26.14 kg/m   Body mass index is 26.14 kg/m.  Tobacco History  Smoking Status  . Never Smoker  Smokeless Tobacco  . Never Used     Counseling given: No   Past Medical History:  Diagnosis Date  . Allergy   . Arthritis    hands  . Asthma   . Elevated PSA    s/p neg biopsy  . Enlarged prostate   . Hyperlipidemia   . Hypertension   . Wears hearing aid    Past Surgical History:  Procedure Laterality Date  . CATARACT EXTRACTION W/PHACO Right 05/08/2015   Procedure: CATARACT EXTRACTION PHACO AND INTRAOCULAR LENS PLACEMENT (IOC);  Surgeon: Leandrew Koyanagi, MD;  Location: [place];  Service: Ophthalmology;  Laterality: Right;  . PROSTATE BIOPSY    . TONSILLECTOMY     Family History  Problem Relation Age of Onset  . Heart disease Father   . Stroke Mother   . Cancer Sister    History  Sexual Activity  . Sexual activity: Yes    Outpatient Encounter Prescriptions as of 06/11/2016  Medication Sig  . amLODipine (NORVASC) 10 MG tablet TAKE 1 TABLET BY MOUTH ONCE A DAY  . Calcium Carbonate-Vitamin D (TH CALCIUM CARBONATE-VITAMIN D) 600-400 MG-UNIT per tablet Take 1 tablet by mouth daily.    . fluticasone (FLONASE) 50 MCG/ACT nasal spray USE 2 SPRAYS IN EACH NOSTRIL DURING ALLERGY SEASON.  . Omega-3 Fatty Acids (FISH OIL) 1000 MG CAPS Take 1 capsule by mouth daily.    Marland Kitchen PROAIR HFA 108 (90 Base) MCG/ACT inhaler INHALE 2 PUFFS EVERY 4 HOURS AS NEEDED  . pseudoephedrine (SUDAFED) 30 MG tablet  Take 30 mg by mouth as needed.    . quinapril (ACCUPRIL) 10 MG tablet TAKE 1 TABLET (10 MG TOTAL) BY MOUTH DAILY.  . simvastatin (ZOCOR) 20 MG tablet Take 1 tablet (20 mg total) by mouth daily. COMPLETE PHYSICAL EXAM REQUIRED FOR ADDITIONAL REFILLS (Patient taking differently: Take 10 mg by mouth every other day. COMPLETE PHYSICAL EXAM REQUIRED FOR ADDITIONAL REFILLS)   No facility-administered encounter medications on file as of 06/11/2016.     Activities of Daily Living In your present state of health, do you have any difficulty performing the following activities: 06/11/2016  Hearing? Y  Vision? N  Difficulty concentrating or making decisions? N  Walking or climbing stairs? N  Dressing or bathing? N  Doing errands, shopping? N  Preparing Food and eating ? N  Using the Toilet? N  In the past six months, have you accidently leaked urine? N  Do you have problems with loss of bowel control? N  Managing your Medications? N  Managing your Finances? N  Housekeeping or managing your Housekeeping? N  Some recent data might be hidden    Patient Care Team: Lucille Passy, MD as PCP - General Raynelle Bring, MD as Consulting Physician (Urology) Birder Robson, MD as Referring Physician (Ophthalmology) Oneta Rack, MD (Dermatology) Boykin Nearing,  DDS as Consulting Physician (Dentistry)   Assessment:    Hearing Screening Comments: Bilateral hearing aids Vision Screening Comments: Last vision exam in June 2017 with Dr. Wallace Going  Exercise Activities and Dietary recommendations Current Exercise Habits: Home exercise routine, Type of exercise: treadmill;strength training/weights, Time (Minutes): 60, Frequency (Times/Week): 4, Weekly Exercise (Minutes/Week): 240, Intensity: Moderate, Exercise limited by: None identified  Goals    . Increase physical activity          Starting 06/15/16, I will continue to exercise at least 60 min 4 days per week.       Fall Risk Fall Risk   06/11/2016 06/10/2015 02/19/2014 02/09/2013  Falls in the past year? No No No No   Depression Screen PHQ 2/9 Scores 06/11/2016 06/10/2015 02/19/2014 02/09/2013  PHQ - 2 Score 0 0 0 0    Cognitive Function MMSE - Mini Mental State Exam 06/11/2016  Orientation to time 5  Orientation to Place 5  Registration 3  Attention/ Calculation 0  Recall 3  Language- name 2 objects 0  Language- repeat 1  Language- follow 3 step command 3  Language- read & follow direction 0  Write a sentence 0  Copy design 0  Total score 20     PLEASE NOTE: A Mini-Cog screen was completed. Maximum score is 20. A value of 0 denotes this part of Folstein MMSE was not completed or the patient failed this part of the Mini-Cog screening.   Mini-Cog Screening Orientation to Time - Max 5 pts Orientation to Place - Max 5 pts Registration - Max 3 pts Recall - Max 3 pts Language Repeat - Max 1 pts Language Follow 3 Step Command - Max 3 pts     Immunization History  Administered Date(s) Administered  . Influenza, High Dose Seasonal PF 04/12/2014  . Pneumococcal Conjugate-13 02/19/2014  . Pneumococcal Polysaccharide-23 09/07/2007  . Td 10/31/2008  . Zoster 09/07/2007   Screening Tests Health Maintenance  Topic Date Due  . TETANUS/TDAP  11/01/2018  . INFLUENZA VACCINE  Addressed  . ZOSTAVAX  Completed  . PNA vac Low Risk Adult  Completed      Plan:     I have personally reviewed and addressed the Medicare Annual Wellness questionnaire and have noted the following in the patient's chart:  A. Medical and social history B. Use of alcohol, tobacco or illicit drugs  C. Current medications and supplements D. Functional ability and status E.  Nutritional status F.  Physical activity G. Advance directives H. List of other physicians I.  Hospitalizations, surgeries, and ER visits in previous 12 months J.  [place] to include hearing, vision, cognitive, depression L. Referrals and appointments -  none  In addition, I have reviewed and discussed with patient certain preventive protocols, quality metrics, and best practice recommendations. A written personalized care plan for preventive services as well as general preventive health recommendations were provided to patient.  See attached scanned questionnaire for additional information.   Signed,   Lindell Noe, MHA, BS, LPN Health Coach

## 2016-06-15 ENCOUNTER — Other Ambulatory Visit: Payer: Self-pay | Admitting: Family Medicine

## 2016-06-15 ENCOUNTER — Ambulatory Visit (INDEPENDENT_AMBULATORY_CARE_PROVIDER_SITE_OTHER): Payer: Medicare Other | Admitting: Family Medicine

## 2016-06-15 ENCOUNTER — Encounter: Payer: Self-pay | Admitting: Family Medicine

## 2016-06-15 VITALS — BP 136/66 | HR 71 | Temp 98.1°F | Ht 65.75 in | Wt 160.8 lb

## 2016-06-15 DIAGNOSIS — J069 Acute upper respiratory infection, unspecified: Secondary | ICD-10-CM | POA: Diagnosis not present

## 2016-06-15 DIAGNOSIS — E7800 Pure hypercholesterolemia, unspecified: Secondary | ICD-10-CM

## 2016-06-15 DIAGNOSIS — Z0000 Encounter for general adult medical examination without abnormal findings: Secondary | ICD-10-CM

## 2016-06-15 DIAGNOSIS — R972 Elevated prostate specific antigen [PSA]: Secondary | ICD-10-CM

## 2016-06-15 DIAGNOSIS — G8929 Other chronic pain: Secondary | ICD-10-CM | POA: Diagnosis not present

## 2016-06-15 DIAGNOSIS — E559 Vitamin D deficiency, unspecified: Secondary | ICD-10-CM

## 2016-06-15 DIAGNOSIS — M545 Low back pain, unspecified: Secondary | ICD-10-CM | POA: Insufficient documentation

## 2016-06-15 DIAGNOSIS — I10 Essential (primary) hypertension: Secondary | ICD-10-CM | POA: Diagnosis not present

## 2016-06-15 DIAGNOSIS — M549 Dorsalgia, unspecified: Secondary | ICD-10-CM | POA: Insufficient documentation

## 2016-06-15 NOTE — Assessment & Plan Note (Signed)
Reviewed preventive care protocols, scheduled due services, and updated immunizations Discussed nutrition, exercise, diet, and healthy lifestyle.  

## 2016-06-15 NOTE — Patient Instructions (Addendum)
Great to see you. Happy Holidays.  Please try Tylenol 500- 1000 mg twice daily, scheduled.  Please increase your simvastatin back to daily dosage.

## 2016-06-15 NOTE — Assessment & Plan Note (Signed)
Deteriorated.  Increase dose of simvastatin back to daily dosing. The patient indicates understanding of these issues and agrees with the plan.

## 2016-06-15 NOTE — Assessment & Plan Note (Signed)
Deteriorated, chronic. Advised scheduling daily tylenol.  See AVS. Call or return to clinic prn if these symptoms worsen or fail to improve as anticipated. The patient indicates understanding of these issues and agrees with the plan.

## 2016-06-15 NOTE — Progress Notes (Signed)
Subjective:   Patient ID: Elijah [name], male    DOB: 05-25-1935, 81 y.o.   MRN: [id number]  Elijah [name] is a pleasant 81 y.o. year old male who presents to clinic today with Annual Exam; congestion in chest; and Back Pain (injury "years ago") and follow up of chronic medical conditions on 06/15/2016  HPI:  Annual medicare wellness visit with Candis Musa, RN on 06/11/16. Note reviewed.  Had colonoscopy at 81, told he did not need another one again.   Pneumovax 09/07/07 prevnar 13 02/19/14 Td 10/31/08 Zoster 09/07/07 Influenza vaccine 04/06/16   HTN- has been on Amlodipine 10 mg dan Quinipril 10 mg daily.  No CP, SOB, blurred vision.   Lab Results  Component Value Date   CREATININE 0.94 06/11/2016    HLD- had been well controlled with low dose simvastatin.  Since last year, has been taking it every other day. Denies myalgias. Lab Results  Component Value Date   CHOL 206 (H) 06/11/2016   HDL 66.10 06/11/2016   LDLCALC 123 (H) 06/11/2016   TRIG 86.0 06/11/2016   CHOLHDL 3 06/11/2016   Lab Results  Component Value Date   ALT 21 06/11/2016   AST 18 06/11/2016   ALKPHOS 65 06/11/2016   BILITOT 1.2 06/11/2016   Elevated PSA- followed by Dr. Alinda Money.  Lab Results  Component Value Date   PSA 6.46 (H) 12/04/2014   PSA 5.16 (H) 02/15/2014   PSA 5.25 (H) 02/06/2013   Congestion in chest- has URI around Thanksgiving.  [place] was likely viral and placed him on course of prednisone.  Symptoms improved but still coughing a lot in the morning- productive of clear sputum.  No fevers, chills, or SOB.   Back pain- h/o chronic intermittent left sided back pain since back injury 40 years ago.  Known DDD of spine.  No numbness, weakness or radiculopathy.   Current Outpatient Prescriptions on File Prior to Visit  Medication Sig Dispense Refill  . amLODipine (NORVASC) 10 MG tablet TAKE 1 TABLET BY MOUTH ONCE A DAY 90 tablet 0  . Calcium  Carbonate-Vitamin D (TH CALCIUM CARBONATE-VITAMIN D) 600-400 MG-UNIT per tablet Take 1 tablet by mouth daily.      . fluticasone (FLONASE) 50 MCG/ACT nasal spray USE 2 SPRAYS IN EACH NOSTRIL DURING ALLERGY SEASON. 16 g 6  . Omega-3 Fatty Acids (FISH OIL) 1000 MG CAPS Take 1 capsule by mouth daily.      Marland Kitchen PROAIR HFA 108 (90 Base) MCG/ACT inhaler INHALE 2 PUFFS EVERY 4 HOURS AS NEEDED 8.5 Inhaler 0  . pseudoephedrine (SUDAFED) 30 MG tablet Take 30 mg by mouth as needed.      . quinapril (ACCUPRIL) 10 MG tablet TAKE 1 TABLET (10 MG TOTAL) BY MOUTH DAILY. 90 tablet 0  . simvastatin (ZOCOR) 20 MG tablet Take 1 tablet (20 mg total) by mouth daily. COMPLETE PHYSICAL EXAM REQUIRED FOR ADDITIONAL REFILLS (Patient taking differently: Take 10 mg by mouth every other day. COMPLETE PHYSICAL EXAM REQUIRED FOR ADDITIONAL REFILLS) 90 tablet 0   No current facility-administered medications on file prior to visit.     Allergies  Allergen Reactions  . Penicillins Hives    Past Medical History:  Diagnosis Date  . Allergy   . Arthritis    hands  . Asthma   . Elevated PSA    s/p neg biopsy  . Enlarged prostate   . Hyperlipidemia   . Hypertension   .  Wears hearing aid     Past Surgical History:  Procedure Laterality Date  . CATARACT EXTRACTION W/PHACO Right 05/08/2015   Procedure: CATARACT EXTRACTION PHACO AND INTRAOCULAR LENS PLACEMENT (IOC);  Surgeon: Leandrew Koyanagi, MD;  Location: [place];  Service: Ophthalmology;  Laterality: Right;  . PROSTATE BIOPSY    . TONSILLECTOMY      Family History  Problem Relation Age of Onset  . Heart disease Father   . Stroke Mother   . Cancer Sister     Social History   Social History  . Marital status: Married    Spouse name: N/A  . Number of children: 1  . Years of education: N/A   Occupational History  . Retired Event organiser Retired   Social History Main Topics  . Smoking status: Never Smoker  . Smokeless tobacco: Never Used    . Alcohol use 8.4 oz/week    14 Shots of liquor per week  . Drug use: No  . Sexual activity: Yes   Other Topics Concern  . Not on file   Social History Narrative   One biological son, one adopted son, two granddaughters, 42 & 70 yo      Has living will and HPOA- wife Deauntae Bernheisel.   Would desire CPR.  Would not want prolonged life support.   The PMH, PSH, Social History, Family History, Medications, and allergies have been reviewed in [facility], and have been updated if relevant.  Review of Systems  Constitutional: Negative.   HENT: Positive for congestion. Negative for ear discharge, ear pain, facial swelling, rhinorrhea, sinus pain, sinus pressure, sneezing, sore throat, tinnitus, trouble swallowing and voice change.   Eyes: Negative.   Respiratory: Positive for cough. Negative for shortness of breath, wheezing and stridor.   Cardiovascular: Negative.   Endocrine: Negative.   Musculoskeletal: Positive for back pain. Negative for gait problem, joint swelling and myalgias.  Allergic/Immunologic: Negative.   Neurological: Negative.   Hematological: Negative.   Psychiatric/Behavioral: Negative.   All other systems reviewed and are negative.      Objective:    BP 136/66   Pulse 71   Temp 98.1 F (36.7 C) (Oral)   Ht 5' 5.75" (1.67 m)   Wt 160 lb 12 oz (72.9 kg)   SpO2 97%   BMI 26.14 kg/m    Physical Exam  Constitutional: He is oriented to person, place, and time. He appears well-developed and well-nourished. No distress.  HENT:  Head: Normocephalic and atraumatic.  Eyes: Conjunctivae are normal.  Neck: Normal range of motion. Neck supple.  Cardiovascular: Normal rate and regular rhythm.   Pulmonary/Chest: Effort normal and breath sounds normal.  Abdominal: Soft. He exhibits no distension and no mass. There is no tenderness. There is no rebound and no guarding.  Musculoskeletal: Normal range of motion. He exhibits no edema.  Neurological: He is alert and oriented to  person, place, and time. No cranial nerve deficit.  Skin: Skin is warm and dry. He is not diaphoretic.  Psychiatric: He has a normal mood and affect. His behavior is normal. Judgment and thought content normal.  Nursing note and vitals reviewed.         Assessment & Plan:   Essential hypertension  Vitamin D deficiency  Pure hypercholesterolemia  Elevated PSA  Routine general medical examination at a health care facility No Follow-up on file.

## 2016-06-15 NOTE — Progress Notes (Signed)
Pre visit review using our clinic review tool, if applicable. No additional management support is needed unless otherwise documented below in the visit note. 

## 2016-06-15 NOTE — Assessment & Plan Note (Signed)
With persistent cough. Exam reassuring. Advised 1 week of mucinex, increase fluids. The patient indicates understanding of these issues and agrees with the plan. Call or return to clinic prn if these symptoms worsen or fail to improve as anticipated.

## 2016-06-15 NOTE — Assessment & Plan Note (Signed)
Followed up urology.

## 2016-06-15 NOTE — Assessment & Plan Note (Signed)
Well controlled.  No changes made. 

## 2016-07-09 ENCOUNTER — Encounter: Payer: Self-pay | Admitting: Family Medicine

## 2016-07-17 ENCOUNTER — Telehealth: Payer: Self-pay | Admitting: Family Medicine

## 2016-07-17 NOTE — Telephone Encounter (Signed)
PT got $337 bill for AWV visits. He is requesting a cb to discuss why he received the bill.

## 2016-07-20 ENCOUNTER — Other Ambulatory Visit: Payer: Self-pay | Admitting: Family Medicine

## 2016-07-24 DIAGNOSIS — Z85828 Personal history of other malignant neoplasm of skin: Secondary | ICD-10-CM | POA: Diagnosis not present

## 2016-07-24 DIAGNOSIS — Z85820 Personal history of malignant melanoma of skin: Secondary | ICD-10-CM | POA: Diagnosis not present

## 2016-07-24 DIAGNOSIS — Z08 Encounter for follow-up examination after completed treatment for malignant neoplasm: Secondary | ICD-10-CM | POA: Diagnosis not present

## 2016-07-24 DIAGNOSIS — L821 Other seborrheic keratosis: Secondary | ICD-10-CM | POA: Diagnosis not present

## 2016-07-24 NOTE — Telephone Encounter (Signed)
Patient came in today checking on his medicare wellness bill he received dated 06/11/16 with lesia and 12/18 with dr Deborra Medina.  His billed is $325.  Please call pt about this

## 2016-07-27 NOTE — Telephone Encounter (Signed)
Rose - were you able to contact this patient?

## 2016-07-29 ENCOUNTER — Other Ambulatory Visit: Payer: Self-pay | Admitting: Family Medicine

## 2016-07-30 NOTE — Telephone Encounter (Signed)
Traditional Medicare only covers AWV and does not cover CPE which is what Elijah [name] was billed.  It will have to be written off.  I will take care of this and notify patient a little later this morning at a more normal hour. Thank you!

## 2016-07-30 NOTE — Telephone Encounter (Signed)
Left mssg on pt's v/m to return my call so we can discuss.

## 2016-11-01 ENCOUNTER — Other Ambulatory Visit: Payer: Self-pay | Admitting: Family Medicine

## 2016-12-09 DIAGNOSIS — R972 Elevated prostate specific antigen [PSA]: Secondary | ICD-10-CM | POA: Diagnosis not present

## 2016-12-10 LAB — PSA: PSA: 6.97

## 2016-12-16 DIAGNOSIS — R972 Elevated prostate specific antigen [PSA]: Secondary | ICD-10-CM | POA: Diagnosis not present

## 2016-12-22 DIAGNOSIS — H2512 Age-related nuclear cataract, left eye: Secondary | ICD-10-CM | POA: Diagnosis not present

## 2017-01-01 ENCOUNTER — Other Ambulatory Visit: Payer: Self-pay | Admitting: Family Medicine

## 2017-04-05 DIAGNOSIS — Z23 Encounter for immunization: Secondary | ICD-10-CM | POA: Diagnosis not present

## 2017-04-29 ENCOUNTER — Other Ambulatory Visit: Payer: Self-pay | Admitting: Family Medicine

## 2017-05-03 ENCOUNTER — Other Ambulatory Visit (INDEPENDENT_AMBULATORY_CARE_PROVIDER_SITE_OTHER): Payer: Medicare Other

## 2017-05-03 ENCOUNTER — Other Ambulatory Visit: Payer: Self-pay | Admitting: Family Medicine

## 2017-05-03 DIAGNOSIS — E785 Hyperlipidemia, unspecified: Secondary | ICD-10-CM

## 2017-05-03 LAB — LIPID PANEL
Cholesterol: 151 mg/dL (ref 0–200)
HDL: 64.4 mg/dL (ref >39.00)
LDL Cholesterol: 67 mg/dL (ref 0–99)
NonHDL: 86.74
Total CHOL/HDL Ratio: 2
Triglycerides: 97 mg/dL (ref 0.0–149.0)
VLDL: 19.4 mg/dL (ref 0.0–40.0)

## 2017-06-01 ENCOUNTER — Other Ambulatory Visit: Payer: Self-pay | Admitting: Family Medicine

## 2017-07-23 DIAGNOSIS — Z08 Encounter for follow-up examination after completed treatment for malignant neoplasm: Secondary | ICD-10-CM | POA: Diagnosis not present

## 2017-07-23 DIAGNOSIS — Z85828 Personal history of other malignant neoplasm of skin: Secondary | ICD-10-CM | POA: Diagnosis not present

## 2017-07-23 DIAGNOSIS — Z85820 Personal history of malignant melanoma of skin: Secondary | ICD-10-CM | POA: Diagnosis not present

## 2017-07-23 DIAGNOSIS — D485 Neoplasm of uncertain behavior of skin: Secondary | ICD-10-CM | POA: Diagnosis not present

## 2017-07-23 DIAGNOSIS — C44619 Basal cell carcinoma of skin of left upper limb, including shoulder: Secondary | ICD-10-CM | POA: Diagnosis not present

## 2017-07-23 DIAGNOSIS — L821 Other seborrheic keratosis: Secondary | ICD-10-CM | POA: Diagnosis not present

## 2017-08-16 ENCOUNTER — Other Ambulatory Visit: Payer: Self-pay | Admitting: Family Medicine

## 2017-08-20 ENCOUNTER — Telehealth: Payer: Self-pay

## 2017-08-20 DIAGNOSIS — Z85820 Personal history of malignant melanoma of skin: Secondary | ICD-10-CM

## 2017-08-20 DIAGNOSIS — R972 Elevated prostate specific antigen [PSA]: Secondary | ICD-10-CM

## 2017-08-20 DIAGNOSIS — I10 Essential (primary) hypertension: Secondary | ICD-10-CM

## 2017-08-20 DIAGNOSIS — E559 Vitamin D deficiency, unspecified: Secondary | ICD-10-CM

## 2017-08-20 DIAGNOSIS — E7800 Pure hypercholesterolemia, unspecified: Secondary | ICD-10-CM

## 2017-08-20 NOTE — Telephone Encounter (Signed)
TA-Pt is scheduled for a lab visit on Monday at 1:00pm for visit with you on Tuesday/what would you like to order? CMP; CBC; PSA? Had Lipid panel on 11.5.18/plz advise/thx dmf

## 2017-08-20 NOTE — Telephone Encounter (Signed)
I have placed lab orders.  Thank you.

## 2017-08-23 ENCOUNTER — Other Ambulatory Visit (INDEPENDENT_AMBULATORY_CARE_PROVIDER_SITE_OTHER): Payer: Medicare Other

## 2017-08-23 DIAGNOSIS — E559 Vitamin D deficiency, unspecified: Secondary | ICD-10-CM | POA: Diagnosis not present

## 2017-08-23 DIAGNOSIS — C44619 Basal cell carcinoma of skin of left upper limb, including shoulder: Secondary | ICD-10-CM | POA: Diagnosis not present

## 2017-08-23 DIAGNOSIS — Z85820 Personal history of malignant melanoma of skin: Secondary | ICD-10-CM | POA: Diagnosis not present

## 2017-08-23 DIAGNOSIS — E7800 Pure hypercholesterolemia, unspecified: Secondary | ICD-10-CM | POA: Diagnosis not present

## 2017-08-23 DIAGNOSIS — R972 Elevated prostate specific antigen [PSA]: Secondary | ICD-10-CM | POA: Diagnosis not present

## 2017-08-23 DIAGNOSIS — L905 Scar conditions and fibrosis of skin: Secondary | ICD-10-CM | POA: Diagnosis not present

## 2017-08-23 LAB — CBC WITH DIFFERENTIAL/PLATELET
Basophils Absolute: 0.1 10*3/uL (ref 0.0–0.1)
Basophils Relative: 0.9 % (ref 0.0–3.0)
Eosinophils Absolute: 0.2 10*3/uL (ref 0.0–0.7)
Eosinophils Relative: 2.5 % (ref 0.0–5.0)
HCT: 47.6 % (ref 39.0–52.0)
Hemoglobin: 16.6 g/dL (ref 13.0–17.0)
Lymphocytes Relative: 17.9 % (ref 12.0–46.0)
Lymphs Abs: 1.7 10*3/uL (ref 0.7–4.0)
MCHC: 34.9 g/dL (ref 30.0–36.0)
MCV: 88.4 fl (ref 78.0–100.0)
Monocytes Absolute: 1 10*3/uL (ref 0.1–1.0)
Monocytes Relative: 10.2 % (ref 3.0–12.0)
Neutro Abs: 6.4 10*3/uL (ref 1.4–7.7)
Neutrophils Relative %: 68.5 % (ref 43.0–77.0)
Platelets: 221 10*3/uL (ref 150.0–400.0)
RBC: 5.38 Mil/uL (ref 4.22–5.81)
RDW: 13.5 % (ref 11.5–15.5)
WBC: 9.4 10*3/uL (ref 4.0–10.5)

## 2017-08-23 LAB — LIPID PANEL
Cholesterol: 165 mg/dL (ref 0–200)
HDL: 63.1 mg/dL (ref >39.00)
LDL Cholesterol: 89 mg/dL (ref 0–99)
NonHDL: 101.72
Total CHOL/HDL Ratio: 3
Triglycerides: 64 mg/dL (ref 0.0–149.0)
VLDL: 12.8 mg/dL (ref 0.0–40.0)

## 2017-08-23 LAB — COMPREHENSIVE METABOLIC PANEL
ALT: 16 U/L (ref 0–53)
AST: 15 U/L (ref 0–37)
Albumin: 4.2 g/dL (ref 3.5–5.2)
Alkaline Phosphatase: 58 U/L (ref 39–117)
BUN: 19 mg/dL (ref 6–23)
CO2: 28 mEq/L (ref 19–32)
Calcium: 10 mg/dL (ref 8.4–10.5)
Chloride: 103 mEq/L (ref 96–112)
Creatinine, Ser: 1.09 mg/dL (ref 0.40–1.50)
GFR: 69.06 mL/min (ref >60.00)
Glucose, Bld: 117 mg/dL — ABNORMAL HIGH (ref 70–99)
Potassium: 4.2 mEq/L (ref 3.5–5.1)
Sodium: 139 mEq/L (ref 135–145)
Total Bilirubin: 1.3 mg/dL — ABNORMAL HIGH (ref 0.2–1.2)
Total Protein: 7 g/dL (ref 6.0–8.3)

## 2017-08-23 LAB — VITAMIN D 25 HYDROXY (VIT D DEFICIENCY, FRACTURES): VITD: 35.47 ng/mL (ref 30.00–100.00)

## 2017-08-23 LAB — PSA: PSA: 6.32 ng/mL — ABNORMAL HIGH (ref 0.10–4.00)

## 2017-08-24 ENCOUNTER — Ambulatory Visit (INDEPENDENT_AMBULATORY_CARE_PROVIDER_SITE_OTHER): Payer: Medicare Other | Admitting: Family Medicine

## 2017-08-24 ENCOUNTER — Encounter: Payer: Self-pay | Admitting: Family Medicine

## 2017-08-24 ENCOUNTER — Telehealth: Payer: Self-pay

## 2017-08-24 VITALS — BP 126/78 | HR 70 | Temp 97.8°F | Ht 65.75 in | Wt 162.4 lb

## 2017-08-24 DIAGNOSIS — E7800 Pure hypercholesterolemia, unspecified: Secondary | ICD-10-CM

## 2017-08-24 DIAGNOSIS — I10 Essential (primary) hypertension: Secondary | ICD-10-CM

## 2017-08-24 MED ORDER — SIMVASTATIN 20 MG PO TABS: 20.0000 mg | ORAL_TABLET | Freq: Every day | ORAL | 2 refills | Status: DC

## 2017-08-24 MED ORDER — QUINAPRIL HCL 10 MG PO TABS: ORAL_TABLET | ORAL | 2 refills | Status: DC

## 2017-08-24 MED ORDER — FLUTICASONE PROPIONATE 50 MCG/ACT NA SUSP: NASAL | 6 refills | Status: DC

## 2017-08-24 MED ORDER — AMLODIPINE BESYLATE 10 MG PO TABS
10.0000 mg | ORAL_TABLET | Freq: Every day | ORAL | 2 refills | Status: DC
Start: 2017-08-24 — End: 2018-06-13

## 2017-08-24 MED ORDER — ALBUTEROL SULFATE HFA 108 (90 BASE) MCG/ACT IN AERS: 2.0000 | INHALATION_SPRAY | RESPIRATORY_TRACT | 2 refills | Status: DC | PRN

## 2017-08-24 NOTE — Assessment & Plan Note (Signed)
Well controlled on current rx. No changes made today. 

## 2017-08-24 NOTE — Assessment & Plan Note (Signed)
Well controlled on current rx. No changes made todaY.

## 2017-08-24 NOTE — Telephone Encounter (Signed)
I faxed lab results including PSA to [facility]/thx dmf

## 2017-08-24 NOTE — Progress Notes (Signed)
Subjective:   Patient ID: Elijah [name], male    DOB: [birth year], 83 y.o.   MRN: [id number]  Elijah [name] is a pleasant 83 y.o. year old male who presents to clinic today with Hypertension (Patient is here today to F/U with HTN.  Fasting labs completed on 2.25.19 and a copy has been faxed to [facility].)  on 08/24/2017  HPI:  HTN- has been on Amlodipine 10 mg dan Quinipril 10 mg daily. No CP, SOB, blurred vision.   Lab Results  Component Value Date   CREATININE 1.09 08/23/2017     HLD- had been well controlled with low dose simvastatin.   Denies myalgias.  Lab Results  Component Value Date   CHOL 165 08/23/2017   HDL 63.10 08/23/2017   LDLCALC 89 08/23/2017   TRIG 64.0 08/23/2017   CHOLHDL 3 08/23/2017     Current Outpatient Medications on File Prior to Visit  Medication Sig Dispense Refill  . Calcium Carbonate-Vitamin D (TH CALCIUM CARBONATE-VITAMIN D) 600-400 MG-UNIT per tablet Take 1 tablet by mouth daily.      . Omega-3 Fatty Acids (FISH OIL) 1000 MG CAPS Take 1 capsule by mouth daily.      . pseudoephedrine (SUDAFED) 30 MG tablet Take 30 mg by mouth as needed.       No current facility-administered medications on file prior to visit.     Allergies  Allergen Reactions  . Penicillins Hives    Past Medical History:  Diagnosis Date  . Allergy   . Arthritis    hands  . Asthma   . Elevated PSA    s/p neg biopsy  . Enlarged prostate   . Hyperlipidemia   . Hypertension   . Wears hearing aid     Past Surgical History:  Procedure Laterality Date  . CATARACT EXTRACTION W/PHACO Right 05/08/2015   Procedure: CATARACT EXTRACTION PHACO AND INTRAOCULAR LENS PLACEMENT (IOC);  Surgeon: Leandrew Koyanagi, MD;  Location: [place];  Service: Ophthalmology;  Laterality: Right;  . PROSTATE BIOPSY    . TONSILLECTOMY      Family History  Problem Relation Age of Onset  . Heart disease Father   . Stroke Mother   . Cancer Sister     Social  History   Socioeconomic History  . Marital status: Married    Spouse name: Not on file  . Number of children: 1  . Years of education: Not on file  . Highest education level: Not on file  Social Needs  . Financial resource strain: Not on file  . Food insecurity - worry: Not on file  . Food insecurity - inability: Not on file  . Transportation needs - medical: Not on file  . Transportation needs - non-medical: Not on file  Occupational History  . Occupation: Retired Technical brewer: retired  Tobacco Use  . Smoking status: Never Smoker  . Smokeless tobacco: Never Used  Substance and Sexual Activity  . Alcohol use: Yes    Alcohol/week: 8.4 oz    Types: 14 Shots of liquor per week  . Drug use: No  . Sexual activity: Yes  Other Topics Concern  . Not on file  Social History Narrative   One biological son, one adopted son, two granddaughters, 82 & 27 yo      Has living will and HPOA- wife Elijah [name].   Would desire CPR.  Would not want prolonged life support.   The PMH, PSH, Social History, Family History,  Medications, and allergies have been reviewed in [facility], and have been updated if relevant.   Review of Systems     Objective:    BP 126/78 (BP Location: Left Arm, Patient Position: Sitting, Cuff Size: Normal)   Pulse 70   Temp 97.8 F (36.6 C) (Oral)   Ht 5' 5.75" (1.67 m)   Wt 162 lb 6.4 oz (73.7 kg)   SpO2 97%   BMI 26.41 kg/m    Physical Exam        Assessment & Plan:   No diagnosis found. No Follow-up on file.

## 2017-08-24 NOTE — Patient Instructions (Signed)
Great to see you!   

## 2017-11-26 DIAGNOSIS — Z85820 Personal history of malignant melanoma of skin: Secondary | ICD-10-CM | POA: Diagnosis not present

## 2017-11-26 DIAGNOSIS — Z85828 Personal history of other malignant neoplasm of skin: Secondary | ICD-10-CM | POA: Diagnosis not present

## 2017-11-26 DIAGNOSIS — Z08 Encounter for follow-up examination after completed treatment for malignant neoplasm: Secondary | ICD-10-CM | POA: Diagnosis not present

## 2017-11-26 DIAGNOSIS — L821 Other seborrheic keratosis: Secondary | ICD-10-CM | POA: Diagnosis not present

## 2017-12-15 DIAGNOSIS — R972 Elevated prostate specific antigen [PSA]: Secondary | ICD-10-CM | POA: Diagnosis not present

## 2017-12-15 LAB — PSA: PSA: 5.84

## 2017-12-27 DIAGNOSIS — H2512 Age-related nuclear cataract, left eye: Secondary | ICD-10-CM | POA: Diagnosis not present

## 2018-01-05 DIAGNOSIS — R972 Elevated prostate specific antigen [PSA]: Secondary | ICD-10-CM | POA: Diagnosis not present

## 2018-01-05 DIAGNOSIS — N400 Enlarged prostate without lower urinary tract symptoms: Secondary | ICD-10-CM | POA: Diagnosis not present

## 2018-01-13 ENCOUNTER — Encounter: Payer: Self-pay | Admitting: Family Medicine

## 2018-01-13 NOTE — Progress Notes (Signed)
[facility]/thx dmf 

## 2018-03-30 DIAGNOSIS — Z23 Encounter for immunization: Secondary | ICD-10-CM | POA: Diagnosis not present

## 2018-06-13 ENCOUNTER — Other Ambulatory Visit: Payer: Self-pay | Admitting: Family Medicine

## 2018-06-14 ENCOUNTER — Other Ambulatory Visit: Payer: Self-pay | Admitting: Family Medicine

## 2018-06-14 MED ORDER — AMLODIPINE BESYLATE 10 MG PO TABS: 5.0000 mg | ORAL_TABLET | Freq: Every day | ORAL | 2 refills | Status: DC

## 2018-06-20 ENCOUNTER — Other Ambulatory Visit: Payer: Self-pay

## 2018-06-20 MED ORDER — SIMVASTATIN 20 MG PO TABS: 20.0000 mg | ORAL_TABLET | Freq: Every day | ORAL | 0 refills | Status: DC

## 2018-07-22 DIAGNOSIS — D2261 Melanocytic nevi of right upper limb, including shoulder: Secondary | ICD-10-CM | POA: Diagnosis not present

## 2018-07-22 DIAGNOSIS — D2272 Melanocytic nevi of left lower limb, including hip: Secondary | ICD-10-CM | POA: Diagnosis not present

## 2018-07-22 DIAGNOSIS — Z85820 Personal history of malignant melanoma of skin: Secondary | ICD-10-CM | POA: Diagnosis not present

## 2018-07-22 DIAGNOSIS — D225 Melanocytic nevi of trunk: Secondary | ICD-10-CM | POA: Diagnosis not present

## 2018-07-22 DIAGNOSIS — Z85828 Personal history of other malignant neoplasm of skin: Secondary | ICD-10-CM | POA: Diagnosis not present

## 2018-07-22 DIAGNOSIS — L821 Other seborrheic keratosis: Secondary | ICD-10-CM | POA: Diagnosis not present

## 2018-07-22 DIAGNOSIS — D2271 Melanocytic nevi of right lower limb, including hip: Secondary | ICD-10-CM | POA: Diagnosis not present

## 2018-07-22 DIAGNOSIS — D2262 Melanocytic nevi of left upper limb, including shoulder: Secondary | ICD-10-CM | POA: Diagnosis not present

## 2018-07-22 DIAGNOSIS — Z08 Encounter for follow-up examination after completed treatment for malignant neoplasm: Secondary | ICD-10-CM | POA: Diagnosis not present

## 2018-08-05 ENCOUNTER — Other Ambulatory Visit: Payer: Self-pay

## 2018-08-05 MED ORDER — FLUTICASONE PROPIONATE 50 MCG/ACT NA SUSP: NASAL | 6 refills | Status: DC

## 2018-09-21 ENCOUNTER — Ambulatory Visit: Payer: Medicare Other | Admitting: *Deleted

## 2018-11-23 ENCOUNTER — Ambulatory Visit: Payer: Medicare Other | Admitting: *Deleted

## 2018-12-14 ENCOUNTER — Other Ambulatory Visit: Payer: Self-pay | Admitting: Family Medicine

## 2019-01-11 DIAGNOSIS — N400 Enlarged prostate without lower urinary tract symptoms: Secondary | ICD-10-CM | POA: Diagnosis not present

## 2019-01-11 DIAGNOSIS — R972 Elevated prostate specific antigen [PSA]: Secondary | ICD-10-CM | POA: Diagnosis not present

## 2019-01-17 NOTE — Progress Notes (Addendum)
Subjective:   Elijah [name] is a 84 y.o. male who presents for Medicare Annual/Subsequent preventive examination.  Review of Systems: No ROS.  See social history for additional risk factors. Cardiac Risk Factors include: advanced age (>80men, >23 women);dyslipidemia;hypertension;male gender Sleep patterns: no issues Home Safety/Smoke Alarms: Feels safe in home. Smoke alarms in place.  Lives in 2 story town home with wife. Son and daughter I law live next door. Walk-in shower with grab rails.   Male:    PSA-  Lab Results  Component Value Date   PSA 5.84 12/15/2017   PSA 6.32 (H) 08/23/2017   PSA 6.97 12/10/2016       Objective:    Vitals: BP 138/82 (BP Location: Left Arm, Patient Position: Sitting, Cuff Size: Normal)   Pulse 73   Temp 98.4 F (36.9 C) (Oral)   Ht 5\' 8"  (1.727 m)   Wt 161 lb 3.2 oz (73.1 kg)   SpO2 98%   BMI 24.51 kg/m   Body mass index is 24.51 kg/m.  Advanced Directives 01/18/2019 06/11/2016 05/08/2015  Does Patient Have a Medical Advance Directive? Yes Yes Yes  Type of Paramedic of [place];Living will [name];Living will [name];Living will  Does patient want to make changes to medical advance directive? No - Patient declined - No - Patient declined  Copy of [name] in Chart? No - copy requested No - copy requested No - copy requested    Tobacco Social History   Tobacco Use  Smoking Status Never Smoker  Smokeless Tobacco Never Used     Counseling given: Not Answered   Clinical Intake: Pain : No/denies pain    Past Medical History:  Diagnosis Date  . Allergy   . Arthritis    hands  . Asthma   . Elevated PSA    s/p neg biopsy  . Enlarged prostate   . Hyperlipidemia   . Hypertension   . Wears hearing aid    Past Surgical History:  Procedure Laterality Date  . CATARACT EXTRACTION W/PHACO Right 05/08/2015   Procedure: CATARACT EXTRACTION PHACO  AND INTRAOCULAR LENS PLACEMENT (IOC);  Surgeon: Leandrew Koyanagi, MD;  Location: [facility];  Service: Ophthalmology;  Laterality: Right;  . PROSTATE BIOPSY    . TONSILLECTOMY     Family History  Problem Relation Age of Onset  . Heart disease Father   . Stroke Mother   . Cancer Sister    Social History   Socioeconomic History  . Marital status: Married    Spouse name: Not on file  . Number of children: 1  . Years of education: Not on file  . Highest education level: Not on file  Occupational History  . Occupation: Retired Technical brewer: retired  Scientific laboratory technician  . Financial resource strain: Not on file  . Food insecurity    Worry: Not on file    Inability: Not on file  . Transportation needs    Medical: Not on file    Non-medical: Not on file  Tobacco Use  . Smoking status: Never Smoker  . Smokeless tobacco: Never Used  Substance and Sexual Activity  . Alcohol use: Yes    Alcohol/week: 14.0 standard drinks    Types: 14 Shots of liquor per week  . Drug use: No  . Sexual activity: Yes  Lifestyle  . Physical activity    Days per week: Not on file    Minutes  per session: Not on file  . Stress: Not on file  Relationships  . Social Herbalist on phone: Not on file    Gets together: Not on file    Attends religious service: Not on file    Active member of club or organization: Not on file    Attends meetings of clubs or organizations: Not on file    Relationship status: Not on file  Other Topics Concern  . Not on file  Social History Narrative   One biological son, one adopted son, two granddaughters, 49 & 36 yo      Has living will and HPOA- wife Elijah [name].   Would desire CPR.  Would not want prolonged life support.    Outpatient Encounter Medications as of 01/18/2019  Medication Sig  . amLODipine (NORVASC) 10 MG tablet Take 0.5 tablets (5 mg total) by mouth daily.  . Calcium Carbonate-Vitamin D (TH CALCIUM CARBONATE-VITAMIN  D) 600-400 MG-UNIT per tablet Take 1 tablet by mouth daily.    . fluticasone (FLONASE) 50 MCG/ACT nasal spray USE 2 SPRAYS IN EACH NOSTRIL DURING ALLERGY SEASON.  Marland Kitchen pseudoephedrine (SUDAFED) 30 MG tablet Take 30 mg by mouth as needed.    . quinapril (ACCUPRIL) 10 MG tablet TAKE 1 TABLET BY MOUTH EVERY DAY  . simvastatin (ZOCOR) 20 MG tablet TAKE 1 TABLET BY MOUTH EVERY DAY  . albuterol (VENTOLIN HFA) 108 (90 Base) MCG/ACT inhaler Inhale 2 puffs into the lungs every 4 (four) hours as needed. (Patient not taking: Reported on 01/18/2019)  . [DISCONTINUED] Omega-3 Fatty Acids (FISH OIL) 1000 MG CAPS Take 1 capsule by mouth daily.     No facility-administered encounter medications on file as of 01/18/2019.     Activities of Daily Living In your present state of health, do you have any difficulty performing the following activities: 01/18/2019  Hearing? Y  Comment wearing hearing aids  Vision? N  Difficulty concentrating or making decisions? N  Walking or climbing stairs? N  Dressing or bathing? N  Doing errands, shopping? N  Preparing Food and eating ? N  Using the Toilet? N  In the past six months, have you accidently leaked urine? N  Do you have problems with loss of bowel control? N  Managing your Medications? N  Managing your Finances? N  Housekeeping or managing your Housekeeping? N  Some recent data might be hidden    Patient Care Team: Lucille Passy, MD as PCP - Starleen Arms, MD as Consulting Physician (Urology) Birder Robson, MD as Referring Physician (Ophthalmology) Oneta Rack, MD (Dermatology) Boykin Nearing, DDS as Consulting Physician (Dentistry) Leandrew Koyanagi, MD as Referring Physician (Ophthalmology)   Assessment:   This is a routine wellness examination for [name]. Physical assessment deferred to PCP.  Exercise Activities and Dietary recommendations Current Exercise Habits: Home exercise routine, Type of exercise: strength  training/weights;stretching;walking;yoga, Time (Minutes): 30, Frequency (Times/Week): 5, Weekly Exercise (Minutes/Week): 150, Intensity: Mild, Exercise limited by: None identified   Diet (meal preparation, eat out, water intake, caffeinated beverages, dairy products, fruits and vegetables): in general, a "healthy" diet  , well balanced. Drinks 3 glass of water per day.     Goals    . Increase physical activity     Starting 06/15/16, I will continue to exercise at least 60 min 4 days per week.        Fall Risk Fall Risk  01/18/2019 08/24/2017 06/11/2016 06/04/2016 06/10/2015  Falls in the past year? 0  No No No No  Comment - - - Emmi Telephone Survey: data to providers prior to load -    Depression Screen PHQ 2/9 Scores 01/18/2019 08/24/2017 06/11/2016 06/10/2015  PHQ - 2 Score 0 0 0 0    Cognitive Function Ad8 score reviewed for issues:  Issues making decisions:no  Less interest in hobbies / activities:no  Repeats questions, stories (family complaining):no  Trouble using ordinary gadgets (microwave, computer, phone):no  Forgets the month or year: no  Mismanaging finances: no  Remembering appts:no  Daily problems with thinking and/or memory:no Ad8 score is=0     MMSE - Mini Mental State Exam 06/11/2016  Orientation to time 5  Orientation to Place 5  Registration 3  Attention/ Calculation 0  Recall 3  Language- name 2 objects 0  Language- repeat 1  Language- follow 3 step command 3  Language- read & follow direction 0  Write a sentence 0  Copy design 0  Total score 20        Immunization History  Administered Date(s) Administered  . Influenza, High Dose Seasonal PF 04/12/2014, 04/05/2017, 03/30/2018  . Influenza-Unspecified 04/06/2016  . Pneumococcal Conjugate-13 02/19/2014  . Pneumococcal Polysaccharide-23 09/07/2007  . Td 10/31/2008  . Zoster 09/07/2007    Screening Tests Health Maintenance  Topic Date Due  . TETANUS/TDAP  11/01/2018  . INFLUENZA  VACCINE  01/28/2019  . PNA vac Low Risk Adult  Completed       Plan:    Please schedule your next medicare wellness visit with me in 1 yr.  Continue to eat heart healthy diet (full of fruits, vegetables, whole grains, lean protein, water--limit salt, fat, and sugar intake) and increase physical activity as tolerated.  Continue doing brain stimulating activities (puzzles, reading, adult coloring books, staying active) to keep memory sharp.   Bring a copy of your living will and/or healthcare power of attorney to your next office visit.   I have personally reviewed and noted the following in the patient's chart:   . Medical and social history . Use of alcohol, tobacco or illicit drugs  . Current medications and supplements . Functional ability and status . Nutritional status . Physical activity . Advanced directives . List of other physicians . Hospitalizations, surgeries, and ER visits in previous 12 months . Vitals . Screenings to include cognitive, depression, and falls . Referrals and appointments  In addition, I have reviewed and discussed with patient certain preventive protocols, quality metrics, and best practice recommendations. A written personalized care plan for preventive services as well as general preventive health recommendations were provided to patient.     Naaman Plummer [place], [place]  01/18/2019

## 2019-01-18 ENCOUNTER — Ambulatory Visit (INDEPENDENT_AMBULATORY_CARE_PROVIDER_SITE_OTHER): Payer: Medicare Other | Admitting: *Deleted

## 2019-01-18 ENCOUNTER — Encounter: Payer: Self-pay | Admitting: *Deleted

## 2019-01-18 VITALS — BP 138/82 | HR 73 | Temp 98.4°F | Ht 68.0 in | Wt 161.2 lb

## 2019-01-18 DIAGNOSIS — Z0000 Encounter for general adult medical examination without abnormal findings: Secondary | ICD-10-CM | POA: Diagnosis not present

## 2019-01-18 NOTE — Patient Instructions (Addendum)
Please schedule your next medicare wellness visit with me in 1 yr.  Continue to eat heart healthy diet (full of fruits, vegetables, whole grains, lean protein, water--limit salt, fat, and sugar intake) and increase physical activity as tolerated.  Continue doing brain stimulating activities (puzzles, reading, adult coloring books, staying active) to keep memory sharp.   Bring a copy of your living will and/or healthcare power of attorney to your next office visit.   [name] , Thank you for taking time to come for your Medicare Wellness Visit. I appreciate your ongoing commitment to your health goals. Please review the following plan we discussed and let me know if I can assist you in the future.   These are the goals we discussed: Goals    . Increase physical activity     Starting 06/15/16, I will continue to exercise at least 60 min 4 days per week.        This is a list of the screening recommended for you and due dates:  Health Maintenance  Topic Date Due  . Tetanus Vaccine  11/01/2018  . Flu Shot  01/28/2019  . Pneumonia vaccines  Completed   Health Maintenance After Age 41 After age 70, you are at a higher risk for certain long-term diseases and infections as well as injuries from falls. Falls are a major cause of broken bones and head injuries in people who are older than age 68. Getting regular preventive care can help to keep you healthy and well. Preventive care includes getting regular testing and making lifestyle changes as recommended by your health care provider. Talk with your health care provider about:  Which screenings and tests you should have. A screening is a test that checks for a disease when you have no symptoms.  A diet and exercise plan that is right for you. What should I know about screenings and tests to prevent falls? Screening and testing are the best ways to find a health problem early. Early diagnosis and treatment give you the best chance of  managing medical conditions that are common after age 67. Certain conditions and lifestyle choices may make you more likely to have a fall. Your health care provider may recommend:  Regular vision checks. Poor vision and conditions such as cataracts can make you more likely to have a fall. If you wear glasses, make sure to get your prescription updated if your vision changes.  Medicine review. Work with your health care provider to regularly review all of the medicines you are taking, including over-the-counter medicines. Ask your health care provider about any side effects that may make you more likely to have a fall. Tell your health care provider if any medicines that you take make you feel dizzy or sleepy.  Osteoporosis screening. Osteoporosis is a condition that causes the bones to get weaker. This can make the bones weak and cause them to break more easily.  Blood pressure screening. Blood pressure changes and medicines to control blood pressure can make you feel dizzy.  Strength and balance checks. Your health care provider may recommend certain tests to check your strength and balance while standing, walking, or changing positions.  Foot health exam. Foot pain and numbness, as well as not wearing proper footwear, can make you more likely to have a fall.  Depression screening. You may be more likely to have a fall if you have a fear of falling, feel emotionally low, or feel unable to do activities that you used to  do.  Alcohol use screening. Using too much alcohol can affect your balance and may make you more likely to have a fall. What actions can I take to lower my risk of falls? General instructions  Talk with your health care provider about your risks for falling. Tell your health care provider if: ? You fall. Be sure to tell your health care provider about all falls, even ones that seem minor. ? You feel dizzy, sleepy, or off-balance.  Take over-the-counter and prescription  medicines only as told by your health care provider. These include any supplements.  Eat a healthy diet and maintain a healthy weight. A healthy diet includes low-fat dairy products, low-fat (lean) meats, and fiber from whole grains, beans, and lots of fruits and vegetables. Home safety  Remove any tripping hazards, such as rugs, cords, and clutter.  Install safety equipment such as grab bars in bathrooms and safety rails on stairs.  Keep rooms and walkways well-lit. Activity   Follow a regular exercise program to stay fit. This will help you maintain your balance. Ask your health care provider what types of exercise are appropriate for you.  If you need a cane or walker, use it as recommended by your health care provider.  Wear supportive shoes that have nonskid soles. Lifestyle  Do not drink alcohol if your health care provider tells you not to drink.  If you drink alcohol, limit how much you have: ? 0-1 drink a day for women. ? 0-2 drinks a day for men.  Be aware of how much alcohol is in your drink. In the [place] one drink equals one typical bottle of beer (12 oz), one-half glass of wine (5 oz), or one shot of hard liquor (1 oz).  Do not use any products that contain nicotine or tobacco, such as cigarettes and e-cigarettes. If you need help quitting, ask your health care provider. Summary  Having a healthy lifestyle and getting preventive care can help to protect your health and wellness after age 30.  Screening and testing are the best way to find a health problem early and help you avoid having a fall. Early diagnosis and treatment give you the best chance for managing medical conditions that are more common for people who are older than age 11.  Falls are a major cause of broken bones and head injuries in people who are older than age 63. Take precautions to prevent a fall at home.  Work with your health care provider to learn what changes you can make to improve your  health and wellness and to prevent falls. This information is not intended to replace advice given to you by your health care provider. Make sure you discuss any questions you have with your health care provider. Document Released: 04/28/2017 Document Revised: 10/06/2018 Document Reviewed: 04/28/2017 Elsevier Patient Education  2020 [employer].

## 2019-01-26 NOTE — Progress Notes (Signed)
Medical screening examination/treatment/procedure(s) were performed by the Wellness Coach, RN. As primary care provider I was immediately available for consulation/collaboration. I agree with above documentation. Kriti Katayama, AGNP-C 

## 2019-02-26 DIAGNOSIS — N400 Enlarged prostate without lower urinary tract symptoms: Secondary | ICD-10-CM | POA: Insufficient documentation

## 2019-02-26 DIAGNOSIS — H2512 Age-related nuclear cataract, left eye: Secondary | ICD-10-CM | POA: Insufficient documentation

## 2019-02-26 NOTE — Progress Notes (Signed)
Subjective:   Patient ID: Elijah [name], male    DOB: 1935-04-06, 84 y.o.   MRN: [id number]  Elijah [name] is a pleasant 84 y.o. year old male who presents to clinic today with Follow-up (Pt screened at vehicle.  He states that he was here recently for a wellness and he got a call that needed an appointment. Pt is not currently fasting. Does not need refills today. Agrees to get flu shot.)  on 02/27/2019  HPI:  Had wellness visit with Gary Fleet, RN on 01/18/19.  Note reviewed.  Doing well.  Trying to remain active but at home. They do take walks outside.  HTN- has been on Amlodipine 10 mg daily along with Quinipril 10 mg daily. No CP, SOB, blurred vision.   Lab Results  Component Value Date   CREATININE 1.09 08/23/2017   Age related nuclear cataract of left eye- saw Dr. Frederik Pear on 12/27/17.  BPH with elevated PSA- saw Dr. Alinda Money on 11/2018.  Lab Results  Component Value Date   PSA 5.84 12/15/2017   PSA 6.32 (H) 08/23/2017   PSA 6.97 12/10/2016     HLD- had been well controlled with zocor 20 mg daily. Denies myalgias.  Due for labs.  Lab Results  Component Value Date   CHOL 165 08/23/2017   HDL 63.10 08/23/2017   LDLCALC 89 08/23/2017   TRIG 64.0 08/23/2017   CHOLHDL 3 08/23/2017    History of malignant melanoma-   Saw Dr. Lucinda Dell on 07/22/18.  Current Outpatient Medications on File Prior to Visit  Medication Sig Dispense Refill  . albuterol (VENTOLIN HFA) 108 (90 Base) MCG/ACT inhaler Inhale 1-2 puffs into the lungs every 4 (four) hours as needed for wheezing or shortness of breath.    Marland Kitchen amLODipine (NORVASC) 10 MG tablet Take 0.5 tablets (5 mg total) by mouth daily. 90 tablet 2  . Calcium Carbonate-Vitamin D (TH CALCIUM CARBONATE-VITAMIN D) 600-400 MG-UNIT per tablet Take 1 tablet by mouth daily.      . fluticasone (FLONASE) 50 MCG/ACT nasal spray USE 2 SPRAYS IN EACH NOSTRIL DURING ALLERGY SEASON. 16 g 6  . pseudoephedrine (SUDAFED) 30 MG tablet Take 30 mg  by mouth as needed.      . quinapril (ACCUPRIL) 10 MG tablet TAKE 1 TABLET BY MOUTH EVERY DAY 90 tablet 2  . simvastatin (ZOCOR) 20 MG tablet TAKE 1 TABLET BY MOUTH EVERY DAY 90 tablet 0   No current facility-administered medications on file prior to visit.     Allergies  Allergen Reactions  . Penicillins Hives    Past Medical History:  Diagnosis Date  . Allergy   . Arthritis    hands  . Asthma   . Elevated PSA    s/p neg biopsy  . Enlarged prostate   . Hyperlipidemia   . Hypertension   . Wears hearing aid     Past Surgical History:  Procedure Laterality Date  . CATARACT EXTRACTION W/PHACO Right 05/08/2015   Procedure: CATARACT EXTRACTION PHACO AND INTRAOCULAR LENS PLACEMENT (IOC);  Surgeon: Leandrew Koyanagi, MD;  Location: [facility];  Service: Ophthalmology;  Laterality: Right;  . PROSTATE BIOPSY    . TONSILLECTOMY      Family History  Problem Relation Age of Onset  . Heart disease Father   . Stroke Mother   . Cancer Sister     Social History   Socioeconomic History  . Marital status: Married    Spouse name: Not on file  . Number of  children: 1  . Years of education: Not on file  . Highest education level: Not on file  Occupational History  . Occupation: Retired Technical brewer: retired  Scientific laboratory technician  . Financial resource strain: Not on file  . Food insecurity    Worry: Not on file    Inability: Not on file  . Transportation needs    Medical: Not on file    Non-medical: Not on file  Tobacco Use  . Smoking status: Never Smoker  . Smokeless tobacco: Never Used  Substance and Sexual Activity  . Alcohol use: Yes    Alcohol/week: 14.0 standard drinks    Types: 14 Shots of liquor per week  . Drug use: No  . Sexual activity: Yes  Lifestyle  . Physical activity    Days per week: Not on file    Minutes per session: Not on file  . Stress: Not on file  Relationships  . Social Herbalist on phone: Not on file    Gets  together: Not on file    Attends religious service: Not on file    Active member of club or organization: Not on file    Attends meetings of clubs or organizations: Not on file    Relationship status: Not on file  . Intimate partner violence    Fear of current or ex partner: Not on file    Emotionally abused: Not on file    Physically abused: Not on file    Forced sexual activity: Not on file  Other Topics Concern  . Not on file  Social History Narrative   One biological son, one adopted son, two granddaughters, 69 & 10 yo      Has living will and HPOA- wife Stas Blackledge.   Would desire CPR.  Would not want prolonged life support.   The PMH, PSH, Social History, Family History, Medications, and allergies have been reviewed in [facility], and have been updated if relevant.   Review of Systems  Constitutional: Negative.   HENT: Negative.   Eyes: Negative.   Respiratory: Negative.   Cardiovascular: Negative.   Gastrointestinal: Negative.   Endocrine: Negative.   Genitourinary: Negative.   Musculoskeletal: Negative.   Allergic/Immunologic: Negative.   Neurological: Negative.   Hematological: Negative.   Psychiatric/Behavioral: Negative.   All other systems reviewed and are negative.      Objective:    BP 131/74   Pulse 80   Temp 98.5 F (36.9 C) (Oral)   Ht 5\' 8"  (1.727 m)   Wt 166 lb (75.3 kg)   SpO2 97%   BMI 25.24 kg/m   Wt Readings from Last 3 Encounters:  02/27/19 166 lb (75.3 kg)  01/18/19 161 lb 3.2 oz (73.1 kg)  08/24/17 162 lb 6.4 oz (73.7 kg)     Physical Exam  Constitutional: He is oriented to person, place, and time. He appears well-developed and well-nourished. No distress.  HENT:  Head: Normocephalic and atraumatic.  Eyes: Conjunctivae are normal.  Neck: Normal range of motion.  Cardiovascular: Normal rate and regular rhythm.  Pulmonary/Chest: Effort normal and breath sounds normal.  Abdominal: Soft. Bowel sounds are normal.  Musculoskeletal:  Normal range of motion.  Neurological: He is alert and oriented to person, place, and time. No cranial nerve deficit.  Skin: Skin is warm and dry. He is not diaphoretic.  Psychiatric: He has a normal mood and affect. His behavior is normal. Judgment and thought content normal.  Nursing note and vitals reviewed.         Assessment & Plan:   Vitamin D deficiency - Plan: Vitamin D (25 hydroxy)  Pure hypercholesterolemia - Plan: CBC with Differential/Platelet, Comprehensive metabolic panel, Lipid panel, Direct LDL  Essential hypertension  History of melanoma  Arthritis  BPH with elevated PSA - Plan: PSA, CANCELED: PSA, Medicare ( [place] only)  Age-related nuclear cataract, left eye  Encounter for screening for malignant neoplasm of prostate  - Plan: CANCELED: PSA, Medicare ( [place] only)  Need for influenza vaccination - Plan: Flu Vaccine QUAD High Dose(Fluad) No follow-ups on file.

## 2019-02-27 ENCOUNTER — Ambulatory Visit (INDEPENDENT_AMBULATORY_CARE_PROVIDER_SITE_OTHER): Payer: Medicare Other | Admitting: Family Medicine

## 2019-02-27 ENCOUNTER — Encounter: Payer: Self-pay | Admitting: Family Medicine

## 2019-02-27 VITALS — BP 131/74 | HR 80 | Temp 98.5°F | Ht 68.0 in | Wt 166.0 lb

## 2019-02-27 DIAGNOSIS — E559 Vitamin D deficiency, unspecified: Secondary | ICD-10-CM

## 2019-02-27 DIAGNOSIS — Z85820 Personal history of malignant melanoma of skin: Secondary | ICD-10-CM

## 2019-02-27 DIAGNOSIS — R972 Elevated prostate specific antigen [PSA]: Secondary | ICD-10-CM | POA: Diagnosis not present

## 2019-02-27 DIAGNOSIS — M1990 Unspecified osteoarthritis, unspecified site: Secondary | ICD-10-CM

## 2019-02-27 DIAGNOSIS — R634 Abnormal weight loss: Secondary | ICD-10-CM | POA: Insufficient documentation

## 2019-02-27 DIAGNOSIS — I10 Essential (primary) hypertension: Secondary | ICD-10-CM | POA: Diagnosis not present

## 2019-02-27 DIAGNOSIS — Z23 Encounter for immunization: Secondary | ICD-10-CM | POA: Diagnosis not present

## 2019-02-27 DIAGNOSIS — E7800 Pure hypercholesterolemia, unspecified: Secondary | ICD-10-CM

## 2019-02-27 DIAGNOSIS — H2512 Age-related nuclear cataract, left eye: Secondary | ICD-10-CM | POA: Diagnosis not present

## 2019-02-27 DIAGNOSIS — N400 Enlarged prostate without lower urinary tract symptoms: Secondary | ICD-10-CM | POA: Diagnosis not present

## 2019-02-27 DIAGNOSIS — Z125 Encounter for screening for malignant neoplasm of prostate: Secondary | ICD-10-CM

## 2019-02-27 DIAGNOSIS — Z0000 Encounter for general adult medical examination without abnormal findings: Secondary | ICD-10-CM | POA: Insufficient documentation

## 2019-02-27 LAB — LIPID PANEL
Cholesterol: 175 mg/dL (ref 0–200)
HDL: 74.5 mg/dL (ref >39.00)
LDL Cholesterol: 79 mg/dL (ref 0–99)
NonHDL: 100.25
Total CHOL/HDL Ratio: 2
Triglycerides: 105 mg/dL (ref 0.0–149.0)
VLDL: 21 mg/dL (ref 0.0–40.0)

## 2019-02-27 LAB — CBC WITH DIFFERENTIAL/PLATELET
Basophils Absolute: 0.1 10*3/uL (ref 0.0–0.1)
Basophils Relative: 0.7 % (ref 0.0–3.0)
Eosinophils Absolute: 0.1 10*3/uL (ref 0.0–0.7)
Eosinophils Relative: 1.4 % (ref 0.0–5.0)
HCT: 46.1 % (ref 39.0–52.0)
Hemoglobin: 16 g/dL (ref 13.0–17.0)
Lymphocytes Relative: 16.6 % (ref 12.0–46.0)
Lymphs Abs: 1.5 10*3/uL (ref 0.7–4.0)
MCHC: 34.7 g/dL (ref 30.0–36.0)
MCV: 90.7 fl (ref 78.0–100.0)
Monocytes Absolute: 0.9 10*3/uL (ref 0.1–1.0)
Monocytes Relative: 10.2 % (ref 3.0–12.0)
Neutro Abs: 6.3 10*3/uL (ref 1.4–7.7)
Neutrophils Relative %: 71.1 % (ref 43.0–77.0)
Platelets: 221 10*3/uL (ref 150.0–400.0)
RBC: 5.08 Mil/uL (ref 4.22–5.81)
RDW: 14.3 % (ref 11.5–15.5)
WBC: 8.8 10*3/uL (ref 4.0–10.5)

## 2019-02-27 LAB — COMPREHENSIVE METABOLIC PANEL
ALT: 11 U/L (ref 0–53)
AST: 14 U/L (ref 0–37)
Albumin: 4.2 g/dL (ref 3.5–5.2)
Alkaline Phosphatase: 49 U/L (ref 39–117)
BUN: 17 mg/dL (ref 6–23)
CO2: 28 mEq/L (ref 19–32)
Calcium: 9.4 mg/dL (ref 8.4–10.5)
Chloride: 105 mEq/L (ref 96–112)
Creatinine, Ser: 1.08 mg/dL (ref 0.40–1.50)
GFR: 65.42 mL/min (ref >60.00)
Glucose, Bld: 113 mg/dL — ABNORMAL HIGH (ref 70–99)
Potassium: 4.2 mEq/L (ref 3.5–5.1)
Sodium: 140 mEq/L (ref 135–145)
Total Bilirubin: 1.1 mg/dL (ref 0.2–1.2)
Total Protein: 6.9 g/dL (ref 6.0–8.3)

## 2019-02-27 LAB — VITAMIN D 25 HYDROXY (VIT D DEFICIENCY, FRACTURES): VITD: 34.19 ng/mL (ref 30.00–100.00)

## 2019-02-27 LAB — PSA: PSA: 6.72 ng/mL — ABNORMAL HIGH (ref 0.10–4.00)

## 2019-02-27 LAB — LDL CHOLESTEROL, DIRECT: Direct LDL: 87 mg/dL

## 2019-02-27 NOTE — Assessment & Plan Note (Signed)
Followed closely by optho- chart reviewed.

## 2019-02-27 NOTE — Assessment & Plan Note (Signed)
Currently taking caltrate.  Repeat Vit D level today.

## 2019-02-27 NOTE — Patient Instructions (Signed)
Great to see you, Elijah [name]! I will call you with your lab results from today and you can view them online.

## 2019-02-27 NOTE — Assessment & Plan Note (Signed)
Well controlled. No changes made to rxs. 

## 2019-02-27 NOTE — Assessment & Plan Note (Signed)
Check PSA and will route to Dr. Alinda Money. The patient indicates understanding of these issues and agrees with the plan.

## 2019-02-27 NOTE — Assessment & Plan Note (Signed)
Followed closely by derm

## 2019-02-27 NOTE — Assessment & Plan Note (Signed)
Has been well controlled.  Over due for labs today. Orders Placed This Encounter  Procedures  . PSA, Medicare ( [place] only)  . CBC with Differential/Platelet  . Comprehensive metabolic panel  . Lipid panel  . Vitamin D (25 hydroxy)

## 2019-03-03 ENCOUNTER — Encounter: Payer: Self-pay | Admitting: Family Medicine

## 2019-03-13 ENCOUNTER — Other Ambulatory Visit: Payer: Self-pay | Admitting: Family Medicine

## 2019-05-22 ENCOUNTER — Other Ambulatory Visit: Payer: Self-pay

## 2019-06-08 ENCOUNTER — Telehealth: Payer: Self-pay | Admitting: Family Medicine

## 2019-06-08 NOTE — Telephone Encounter (Signed)
Patient stated that Dr. Deborra Medina reduced his amLODipine (NORVASC) 10 MG tablet [id number]  To 5mg .   Patient is needing a new script of the 5mg  Amlodipine to be sent to the pharmacy please?  Preferred Pharmacy-[facility]

## 2019-06-09 ENCOUNTER — Other Ambulatory Visit: Payer: Self-pay

## 2019-06-09 MED ORDER — AMLODIPINE BESYLATE 5 MG PO TABS: 5.0000 mg | ORAL_TABLET | Freq: Every day | ORAL | 2 refills | Status: DC

## 2019-06-09 NOTE — Telephone Encounter (Signed)
Rx sent in for 5mg . Pt did not want to keep cutting pills in half it was messy.

## 2019-07-20 ENCOUNTER — Telehealth: Payer: Self-pay | Admitting: Family Medicine

## 2019-07-20 NOTE — Telephone Encounter (Signed)
That would be okay

## 2019-07-20 NOTE — Telephone Encounter (Signed)
Pt would like to transfer to Dr Ethelene Hal since Dr Deborra Medina is leaving

## 2019-07-20 NOTE — Telephone Encounter (Signed)
Since I will no longer be with the practice, you don't have to ask me about transfers.    Elijah [name] and his wife are very nice.

## 2019-07-21 DIAGNOSIS — D2262 Melanocytic nevi of left upper limb, including shoulder: Secondary | ICD-10-CM | POA: Diagnosis not present

## 2019-07-21 DIAGNOSIS — Z85828 Personal history of other malignant neoplasm of skin: Secondary | ICD-10-CM | POA: Diagnosis not present

## 2019-07-21 DIAGNOSIS — Z85820 Personal history of malignant melanoma of skin: Secondary | ICD-10-CM | POA: Diagnosis not present

## 2019-07-21 DIAGNOSIS — D2261 Melanocytic nevi of right upper limb, including shoulder: Secondary | ICD-10-CM | POA: Diagnosis not present

## 2019-07-21 DIAGNOSIS — D2271 Melanocytic nevi of right lower limb, including hip: Secondary | ICD-10-CM | POA: Diagnosis not present

## 2019-07-21 DIAGNOSIS — D2272 Melanocytic nevi of left lower limb, including hip: Secondary | ICD-10-CM | POA: Diagnosis not present

## 2019-07-21 NOTE — Telephone Encounter (Signed)
Pt said he will call back when he is ready to schedule appt

## 2019-08-13 ENCOUNTER — Ambulatory Visit: Payer: Medicare Other | Attending: Internal Medicine

## 2019-08-13 DIAGNOSIS — Z23 Encounter for immunization: Secondary | ICD-10-CM

## 2019-08-13 NOTE — Progress Notes (Signed)
   Covid-19 Vaccination Clinic  Name:  Elijah [name]    MRN: [id number] DOB: 1934/10/10  08/13/2019  Mr. Born was observed post Covid-19 immunization for 15 minutes without incidence. He was provided with Vaccine Information Sheet and instruction to access the V-Safe system.   [name] was instructed to call 911 with any severe reactions post vaccine: Marland Kitchen Difficulty breathing  . Swelling of your face and throat  . A fast heartbeat  . A bad rash all over your body  . Dizziness and weakness    Immunizations Administered    Name Date Dose VIS Date Route   Pfizer COVID-19 Vaccine 08/13/2019 12:55 PM 0.3 mL 06/09/2019 Intramuscular   Manufacturer: [place]   Lot: [id number]   [place]: [id number]

## 2019-09-03 ENCOUNTER — Emergency Department (HOSPITAL_COMMUNITY): Payer: Medicare Other

## 2019-09-03 ENCOUNTER — Inpatient Hospital Stay (HOSPITAL_COMMUNITY)
Admission: EM | Admit: 2019-09-03 | Discharge: 2019-09-05 | DRG: 066 | Disposition: A | Discharge status: Home Health | Payer: Medicare Other | Attending: Family Medicine | Admitting: Family Medicine

## 2019-09-03 ENCOUNTER — Encounter (HOSPITAL_COMMUNITY): Payer: Self-pay | Admitting: Emergency Medicine

## 2019-09-03 ENCOUNTER — Other Ambulatory Visit: Payer: Self-pay

## 2019-09-03 DIAGNOSIS — R42 Dizziness and giddiness: Secondary | ICD-10-CM | POA: Diagnosis present

## 2019-09-03 DIAGNOSIS — I672 Cerebral atherosclerosis: Secondary | ICD-10-CM | POA: Diagnosis present

## 2019-09-03 DIAGNOSIS — H9190 Unspecified hearing loss, unspecified ear: Secondary | ICD-10-CM | POA: Diagnosis present

## 2019-09-03 DIAGNOSIS — I10 Essential (primary) hypertension: Secondary | ICD-10-CM | POA: Diagnosis not present

## 2019-09-03 DIAGNOSIS — E876 Hypokalemia: Secondary | ICD-10-CM | POA: Diagnosis not present

## 2019-09-03 DIAGNOSIS — Z20822 Contact with and (suspected) exposure to covid-19: Secondary | ICD-10-CM | POA: Diagnosis present

## 2019-09-03 DIAGNOSIS — R29700 NIHSS score 0: Secondary | ICD-10-CM | POA: Diagnosis present

## 2019-09-03 DIAGNOSIS — I6523 Occlusion and stenosis of bilateral carotid arteries: Secondary | ICD-10-CM | POA: Diagnosis not present

## 2019-09-03 DIAGNOSIS — E785 Hyperlipidemia, unspecified: Secondary | ICD-10-CM | POA: Diagnosis present

## 2019-09-03 DIAGNOSIS — I63231 Cerebral infarction due to unspecified occlusion or stenosis of right carotid arteries: Secondary | ICD-10-CM | POA: Diagnosis not present

## 2019-09-03 DIAGNOSIS — J4520 Mild intermittent asthma, uncomplicated: Secondary | ICD-10-CM

## 2019-09-03 DIAGNOSIS — R2681 Unsteadiness on feet: Secondary | ICD-10-CM | POA: Diagnosis present

## 2019-09-03 DIAGNOSIS — Z8249 Family history of ischemic heart disease and other diseases of the circulatory system: Secondary | ICD-10-CM

## 2019-09-03 DIAGNOSIS — Z823 Family history of stroke: Secondary | ICD-10-CM

## 2019-09-03 DIAGNOSIS — I63531 Cerebral infarction due to unspecified occlusion or stenosis of right posterior cerebral artery: Secondary | ICD-10-CM | POA: Diagnosis not present

## 2019-09-03 DIAGNOSIS — R29810 Facial weakness: Secondary | ICD-10-CM | POA: Diagnosis not present

## 2019-09-03 DIAGNOSIS — N400 Enlarged prostate without lower urinary tract symptoms: Secondary | ICD-10-CM | POA: Diagnosis present

## 2019-09-03 DIAGNOSIS — E7800 Pure hypercholesterolemia, unspecified: Secondary | ICD-10-CM

## 2019-09-03 DIAGNOSIS — H5509 Other forms of nystagmus: Secondary | ICD-10-CM | POA: Diagnosis present

## 2019-09-03 DIAGNOSIS — R5381 Other malaise: Secondary | ICD-10-CM | POA: Diagnosis present

## 2019-09-03 DIAGNOSIS — I639 Cerebral infarction, unspecified: Secondary | ICD-10-CM | POA: Diagnosis not present

## 2019-09-03 DIAGNOSIS — I63541 Cerebral infarction due to unspecified occlusion or stenosis of right cerebellar artery: Secondary | ICD-10-CM

## 2019-09-03 DIAGNOSIS — R55 Syncope and collapse: Secondary | ICD-10-CM | POA: Diagnosis not present

## 2019-09-03 DIAGNOSIS — R61 Generalized hyperhidrosis: Secondary | ICD-10-CM | POA: Diagnosis present

## 2019-09-03 DIAGNOSIS — Z85820 Personal history of malignant melanoma of skin: Secondary | ICD-10-CM

## 2019-09-03 DIAGNOSIS — R519 Headache, unspecified: Secondary | ICD-10-CM | POA: Diagnosis not present

## 2019-09-03 LAB — CBC WITH DIFFERENTIAL/PLATELET
Abs Immature Granulocytes: 0.1 10*3/uL — ABNORMAL HIGH (ref 0.00–0.07)
Basophils Absolute: 0.1 10*3/uL (ref 0.0–0.1)
Basophils Relative: 1 %
Eosinophils Absolute: 0 10*3/uL (ref 0.0–0.5)
Eosinophils Relative: 0 %
HCT: 48.3 % (ref 39.0–52.0)
Hemoglobin: 17.7 g/dL — ABNORMAL HIGH (ref 13.0–17.0)
Immature Granulocytes: 1 %
Lymphocytes Relative: 9 %
Lymphs Abs: 0.9 10*3/uL (ref 0.7–4.0)
MCH: 32.7 pg (ref 26.0–34.0)
MCHC: 36.6 g/dL — ABNORMAL HIGH (ref 30.0–36.0)
MCV: 89.1 fL (ref 80.0–100.0)
Monocytes Absolute: 0.8 10*3/uL (ref 0.1–1.0)
Monocytes Relative: 7 %
Neutro Abs: 8.5 10*3/uL — ABNORMAL HIGH (ref 1.7–7.7)
Neutrophils Relative %: 82 %
Platelets: 220 10*3/uL (ref 150–400)
RBC: 5.42 MIL/uL (ref 4.22–5.81)
RDW: 13.2 % (ref 11.5–15.5)
WBC: 10.3 10*3/uL (ref 4.0–10.5)
nRBC: 0 % (ref 0.0–0.2)

## 2019-09-03 LAB — URINALYSIS, ROUTINE W REFLEX MICROSCOPIC
Bilirubin Urine: NEGATIVE
Glucose, UA: NEGATIVE mg/dL
Hgb urine dipstick: NEGATIVE
Ketones, ur: 5 mg/dL — AB
Leukocytes,Ua: NEGATIVE
Nitrite: NEGATIVE
Protein, ur: NEGATIVE mg/dL
Specific Gravity, Urine: 1.011 (ref 1.005–1.030)
pH: 7 (ref 5.0–8.0)

## 2019-09-03 LAB — COMPREHENSIVE METABOLIC PANEL
ALT: 17 U/L (ref 0–44)
AST: 24 U/L (ref 15–41)
Albumin: 4.4 g/dL (ref 3.5–5.0)
Alkaline Phosphatase: 67 U/L (ref 38–126)
Anion gap: 13 (ref 5–15)
BUN: 15 mg/dL (ref 8–23)
CO2: 21 mmol/L — ABNORMAL LOW (ref 22–32)
Calcium: 9.6 mg/dL (ref 8.9–10.3)
Chloride: 106 mmol/L (ref 98–111)
Creatinine, Ser: 1.12 mg/dL (ref 0.61–1.24)
GFR calc Af Amer: >60 mL/min (ref >60)
GFR calc non Af Amer: >60 mL/min (ref >60)
Glucose, Bld: 138 mg/dL — ABNORMAL HIGH (ref 70–99)
Potassium: 3.3 mmol/L — ABNORMAL LOW (ref 3.5–5.1)
Sodium: 140 mmol/L (ref 135–145)
Total Bilirubin: 1.6 mg/dL — ABNORMAL HIGH (ref 0.3–1.2)
Total Protein: 7.6 g/dL (ref 6.5–8.1)

## 2019-09-03 LAB — CBC
HCT: 48 % (ref 39.0–52.0)
Hemoglobin: 17.7 g/dL — ABNORMAL HIGH (ref 13.0–17.0)
MCH: 32.8 pg (ref 26.0–34.0)
MCHC: 36.9 g/dL — ABNORMAL HIGH (ref 30.0–36.0)
MCV: 89.1 fL (ref 80.0–100.0)
Platelets: 215 10*3/uL (ref 150–400)
RBC: 5.39 MIL/uL (ref 4.22–5.81)
RDW: 13.3 % (ref 11.5–15.5)
WBC: 14.5 10*3/uL — ABNORMAL HIGH (ref 4.0–10.5)
nRBC: 0 % (ref 0.0–0.2)

## 2019-09-03 LAB — CREATININE, SERUM
Creatinine, Ser: 0.98 mg/dL (ref 0.61–1.24)
GFR calc Af Amer: >60 mL/min (ref >60)
GFR calc non Af Amer: >60 mL/min (ref >60)

## 2019-09-03 LAB — LIPID PANEL
Cholesterol: 216 mg/dL — ABNORMAL HIGH (ref 0–200)
HDL: 74 mg/dL (ref >40)
LDL Cholesterol: 124 mg/dL — ABNORMAL HIGH (ref 0–99)
Total CHOL/HDL Ratio: 2.9 RATIO
Triglycerides: 89 mg/dL (ref <150)
VLDL: 18 mg/dL (ref 0–40)

## 2019-09-03 LAB — HEMOGLOBIN A1C
Hgb A1c MFr Bld: 4.9 % (ref 4.8–5.6)
Mean Plasma Glucose: 93.93 mg/dL

## 2019-09-03 LAB — SARS CORONAVIRUS 2 (TAT 6-24 HRS): SARS Coronavirus 2: NEGATIVE

## 2019-09-03 LAB — TROPONIN I (HIGH SENSITIVITY)
Troponin I (High Sensitivity): 5 ng/L (ref <18)
Troponin I (High Sensitivity): 24 ng/L — ABNORMAL HIGH (ref <18)

## 2019-09-03 MED ORDER — ATORVASTATIN CALCIUM 40 MG PO TABS
40.0000 mg | ORAL_TABLET | Freq: Every day | ORAL | Status: DC
  Administered 2019-09-04: 40 mg via ORAL
  Filled 2019-09-03: qty 1

## 2019-09-03 MED ORDER — DIPHENHYDRAMINE HCL 50 MG/ML IJ SOLN
12.5000 mg | Freq: Once | INTRAMUSCULAR | Status: AC
  Administered 2019-09-03: 12.5 mg via INTRAVENOUS
  Filled 2019-09-03: qty 1

## 2019-09-03 MED ORDER — ACETAMINOPHEN 160 MG/5ML PO SOLN: 650.0000 mg | ORAL | Status: DC | PRN

## 2019-09-03 MED ORDER — METOCLOPRAMIDE HCL 5 MG/ML IJ SOLN
10.0000 mg | Freq: Once | INTRAMUSCULAR | Status: AC
  Administered 2019-09-03: 10 mg via INTRAVENOUS
  Filled 2019-09-03: qty 2

## 2019-09-03 MED ORDER — ATORVASTATIN CALCIUM 80 MG PO TABS: 80.0000 mg | ORAL_TABLET | Freq: Every day | ORAL | Status: DC

## 2019-09-03 MED ORDER — POTASSIUM CHLORIDE 20 MEQ PO PACK
20.0000 meq | PACK | Freq: Once | ORAL | Status: AC
  Administered 2019-09-03: 20 meq via ORAL
  Filled 2019-09-03 (×2): qty 1

## 2019-09-03 MED ORDER — CLOPIDOGREL BISULFATE 75 MG PO TABS
75.0000 mg | ORAL_TABLET | Freq: Every day | ORAL | Status: DC
  Administered 2019-09-04 – 2019-09-05 (×2): 75 mg via ORAL
  Filled 2019-09-03 (×2): qty 1

## 2019-09-03 MED ORDER — IOHEXOL 350 MG/ML SOLN
50.0000 mL | Freq: Once | INTRAVENOUS | Status: AC | PRN
  Administered 2019-09-03: 50 mL via INTRAVENOUS

## 2019-09-03 MED ORDER — ENOXAPARIN SODIUM 40 MG/0.4ML ~~LOC~~ SOLN
40.0000 mg | SUBCUTANEOUS | Status: DC
  Administered 2019-09-03: 21:00:00 40 mg via SUBCUTANEOUS
  Filled 2019-09-03: qty 0.4

## 2019-09-03 MED ORDER — ASPIRIN 325 MG PO TABS
325.0000 mg | ORAL_TABLET | Freq: Once | ORAL | Status: AC
  Administered 2019-09-03: 325 mg via ORAL
  Filled 2019-09-03: qty 1

## 2019-09-03 MED ORDER — AMLODIPINE BESYLATE 5 MG PO TABS
5.0000 mg | ORAL_TABLET | Freq: Once | ORAL | Status: DC
Start: 2019-09-03 — End: 2019-09-03

## 2019-09-03 MED ORDER — MECLIZINE HCL 25 MG PO TABS
25.0000 mg | ORAL_TABLET | Freq: Once | ORAL | Status: AC
  Administered 2019-09-03: 25 mg via ORAL
  Filled 2019-09-03: qty 1

## 2019-09-03 MED ORDER — ASPIRIN 325 MG PO TABS
325.0000 mg | ORAL_TABLET | Freq: Every day | ORAL | Status: DC
  Administered 2019-09-04 – 2019-09-05 (×2): 325 mg via ORAL
  Filled 2019-09-03 (×2): qty 1

## 2019-09-03 MED ORDER — ACETAMINOPHEN 325 MG PO TABS: 650.0000 mg | ORAL_TABLET | ORAL | Status: DC | PRN

## 2019-09-03 MED ORDER — ALBUTEROL SULFATE (2.5 MG/3ML) 0.083% IN NEBU: 3.0000 mL | INHALATION_SOLUTION | RESPIRATORY_TRACT | Status: DC | PRN

## 2019-09-03 MED ORDER — ACETAMINOPHEN 650 MG RE SUPP: 650.0000 mg | RECTAL | Status: DC | PRN

## 2019-09-03 MED ORDER — STROKE: EARLY STAGES OF RECOVERY BOOK
Freq: Once | Status: AC
  Filled 2019-09-03: qty 1

## 2019-09-03 MED ORDER — ONDANSETRON HCL 4 MG PO TABS: 4.0000 mg | ORAL_TABLET | ORAL | Status: DC | PRN

## 2019-09-03 NOTE — ED Notes (Signed)
Attempted to call report to [facility]

## 2019-09-03 NOTE — ED Notes (Signed)
Report given to receiving nurse

## 2019-09-03 NOTE — ED Notes (Signed)
Pt returned from MRI °

## 2019-09-03 NOTE — ED Notes (Signed)
Pt going to mri 

## 2019-09-03 NOTE — ED Triage Notes (Signed)
Pt here via ems with near syncope pt turned around n got dizzy and diaphoretic and lowered himself to the ground. Orthostatics unable to  Be done by ems.

## 2019-09-03 NOTE — H&P (Addendum)
History and Physical    Elijah [name] [id number] DOB: [birth year] DOA: 09/03/2019  PCP: Lucille Passy, MD  Patient coming from: Home  I have personally briefly reviewed patient's old medical records in [place]  Chief Complaint: Near syncope  HPI: Elijah [name] is a 85 y.o. male with medical history significant of hypertension, hyperlipidemia, asthma, arthritis presents to emergency department due to dizziness, diaphoresis.  Patient tells me that he just got home from church this afternoon and was trying to undress himself and suddenly he became dizzy, he described as room is spinning around him, unable to open his eyes and had to lay down on the floor.  Reports nausea and headache as well.   No history of head trauma, seizures, loss of consciousness, urinary or bowel incontinence, previous history of stroke, blurry vision, chest pain, shortness of breath, palpitation, leg swelling, fever, chills, cough, congestion, dysuria, diarrhea, vomiting, decreased appetite, generalized weakness or lethargy.  He lives with his wife, no history of smoking, illicit drug use however drinks whiskey every day.  He is fairly active and exercise every day.  ED Course: Upon arrival: Patient's blood pressure was noted to be on higher side.  Initial labs such as UA came back negative, CBC: WNL, CMP shows potassium of 3.3.  MRI, CT angiogram of head and neck concerning for right cerebellar infarction.  EDP consulted neurology for further evaluation and management.  [facility] hospitalist consulted for the admission.  Review of Systems: As per HPI otherwise negative.    Past Medical History:  Diagnosis Date  . Allergy   . Arthritis    hands  . Asthma   . Elevated PSA    s/p neg biopsy  . Enlarged prostate   . Hyperlipidemia   . Hypertension   . Wears hearing aid     Past Surgical History:  Procedure Laterality Date  . CATARACT EXTRACTION W/PHACO Right 05/08/2015   Procedure: CATARACT  EXTRACTION PHACO AND INTRAOCULAR LENS PLACEMENT (IOC);  Surgeon: Leandrew Koyanagi, MD;  Location: [facility];  Service: Ophthalmology;  Laterality: Right;  . PROSTATE BIOPSY    . TONSILLECTOMY       reports that he has never smoked. He has never used smokeless tobacco. He reports current alcohol use of about 14.0 standard drinks of alcohol per week. He reports that he does not use drugs.  Allergies  Allergen Reactions  . Penicillins Hives    Family History  Problem Relation Age of Onset  . Heart disease Father   . Stroke Mother   . Cancer Sister     Prior to Admission medications   Medication Sig Start Date End Date Taking? Authorizing Provider  albuterol (VENTOLIN HFA) 108 (90 Base) MCG/ACT inhaler Inhale 1-2 puffs into the lungs every 4 (four) hours as needed for wheezing or shortness of breath.   Yes [provider]  amLODipine (NORVASC) 5 MG tablet Take 1 tablet (5 mg total) by mouth daily. 06/09/19  Yes Lucille Passy, MD  cholecalciferol (VITAMIN D3) 25 MCG (1000 UNIT) tablet Take 2,000 Units by mouth daily.   Yes [provider]  fluticasone (FLONASE) 50 MCG/ACT nasal spray USE 2 SPRAYS IN EACH NOSTRIL DURING ALLERGY SEASON. 08/05/18  Yes Lucille Passy, MD  pseudoephedrine (SUDAFED) 30 MG tablet Take 30 mg by mouth as needed.     Yes [provider]  quinapril (ACCUPRIL) 10 MG tablet TAKE 1 TABLET BY MOUTH EVERY DAY Patient taking differently: Take 10 mg by  mouth daily. TAKE 1 TABLET BY MOUTH EVERY DAY 03/13/19  Yes Lucille Passy, MD  simvastatin (ZOCOR) 20 MG tablet TAKE 1 TABLET BY MOUTH EVERY DAY Patient taking differently: Take 20 mg by mouth daily at 6 PM. Pt states he "chips some off of tablet" dose unknown. 12/14/18  Yes Lucille Passy, MD  Calcium Carbonate-Vitamin D (TH CALCIUM CARBONATE-VITAMIN D) 600-400 MG-UNIT per tablet Take 1 tablet by mouth daily.      [provider]    Physical Exam: Vitals:   09/03/19 1600 09/03/19  1745 09/03/19 1800 09/03/19 1845  BP: (!) 172/95 (!) 172/82  (!) 185/106  Pulse: 95     Resp:  15 10 19   Temp:      TempSrc:      SpO2: 99%       Constitutional: NAD, calm, comfortable, communicating well Eyes: PERRL, lids and conjunctivae normal ENMT: Mucous membranes are moist. Posterior pharynx clear of any exudate or lesions.Normal dentition.  Neck: normal, supple, no masses, no thyromegaly Respiratory: clear to auscultation bilaterally, no wheezing, no crackles. Normal respiratory effort. No accessory muscle use.  Cardiovascular: Regular rate and rhythm, no murmurs / rubs / gallops. No extremity edema. 2+ pedal pulses. No carotid bruits.  Abdomen: no tenderness, no masses palpated. No hepatosplenomegaly. Bowel sounds positive.  Musculoskeletal: no clubbing / cyanosis. No joint deformity upper and lower extremities. Good ROM, no contractures. Normal muscle tone.  Skin: no rashes, lesions, ulcers. No induration Neurologic: CN 2-12 grossly intact. Sensation intact, DTR normal. Strength 5/5 in all 4.  Psychiatric: Normal judgment and insight. Alert and oriented x 3. Normal mood.    Labs on Admission: I have personally reviewed following labs and imaging studies  CBC: Recent Labs  Lab 09/03/19 1508  WBC 10.3  NEUTROABS 8.5*  HGB 17.7*  HCT 48.3  MCV 89.1  PLT [id number]   Basic Metabolic Panel: Recent Labs  Lab 09/03/19 1508  NA 140  K 3.3*  CL 106  CO2 21*  GLUCOSE 138*  BUN 15  CREATININE 1.12  CALCIUM 9.6   GFR: CrCl cannot be calculated (Unknown ideal weight.). Liver Function Tests: Recent Labs  Lab 09/03/19 1508  AST 24  ALT 17  ALKPHOS 67  BILITOT 1.6*  PROT 7.6  ALBUMIN 4.4   No results for input(s): LIPASE, AMYLASE in the last 168 hours. No results for input(s): AMMONIA in the last 168 hours. Coagulation Profile: No results for input(s): INR, PROTIME in the last 168 hours. Cardiac Enzymes: No results for input(s): CKTOTAL, CKMB, CKMBINDEX, TROPONINI  in the last 168 hours. BNP (last 3 results) No results for input(s): PROBNP in the last 8760 hours. HbA1C: Recent Labs    09/03/19 1508  HGBA1C 4.9   CBG: No results for input(s): GLUCAP in the last 168 hours. Lipid Profile: No results for input(s): CHOL, HDL, LDLCALC, TRIG, CHOLHDL, LDLDIRECT in the last 72 hours. Thyroid Function Tests: No results for input(s): TSH, T4TOTAL, FREET4, T3FREE, THYROIDAB in the last 72 hours. Anemia Panel: No results for input(s): VITAMINB12, FOLATE, FERRITIN, TIBC, IRON, RETICCTPCT in the last 72 hours. Urine analysis:    Component Value Date/Time   COLORURINE YELLOW 09/03/2019 [place] 09/03/2019 1458   LABSPEC 1.011 09/03/2019 1458   PHURINE 7.0 09/03/2019 1458   GLUCOSEU NEGATIVE 09/03/2019 1458   HGBUR NEGATIVE 09/03/2019 1458   BILIRUBINUR NEGATIVE 09/03/2019 1458   KETONESUR 5 (A) 09/03/2019 1458   PROTEINUR NEGATIVE 09/03/2019 1458  NITRITE NEGATIVE 09/03/2019 1458   LEUKOCYTESUR NEGATIVE 09/03/2019 1458    Radiological Exams on Admission: CT Angio Head W or Wo Contrast  Result Date: 09/03/2019 CLINICAL DATA:  Carotid artery stenosis. Near syncopal episode today. Dizziness. EXAM: CT ANGIOGRAPHY HEAD AND NECK TECHNIQUE: Multidetector CT imaging of the head and neck was performed using the standard protocol during bolus administration of intravenous contrast. Multiplanar CT image reconstructions and MIPs were obtained to evaluate the vascular anatomy. Carotid stenosis measurements (when applicable) are obtained utilizing NASCET criteria, using the distal internal carotid diameter as the denominator. CONTRAST:  23mL OMNIPAQUE IOHEXOL 350 MG/ML SOLN COMPARISON:  Brain MRI performed earlier the same day 09/03/2019. FINDINGS: CT HEAD FINDINGS Brain: There is no evidence of acute intracranial hemorrhage, intracranial mass, midline shift or extra-axial fluid collection.No demarcated cortical infarction. A moderate-sized acute  infarct within the right cerebellum in the right PICA vascular territory was better appreciated on same-day brain MRI. Redemonstrated chronic infarcts within the left cerebellum. Background mild generalized parenchymal atrophy and chronic small vessel ischemic disease. Vascular: Reported separately. Skull: Normal. Negative for fracture or focal lesion. Sinuses: No significant paranasal sinus disease or mastoid effusion. Orbits: Visualized orbits demonstrate no acute abnormality. Review of the MIP images confirms the above findings CTA NECK FINDINGS Aortic arch: Standard aortic branching. Calcified plaque within the visualized aortic arch and proximal major branch vessels of the neck. No significant innominate or proximal subclavian artery stenosis. Right carotid system: CCA and ICA patent within the neck without significant stenosis (50% or greater). Mild mixed plaque within the proximal ICA. Left carotid system: CCA and ICA patent within the neck without significant stenosis (50% or greater). Minimal mixed plaque within the proximal ICA. Vertebral arteries: Predominantly soft plaque results in severe stenosis at the origin of the right vertebral artery. Portions of the proximal V2 right vertebral artery appears severely stenosed or occluded (for instance as seen on series 11, image 237). More distally, the V2 and V3 right vertebral artery is patent. The V1 left vertebral artery appears occluded. There is reconstitution of flow within the V2 and left vertebral artery, although with a diminutive appearance of this vessel. Skeleton: No acute bony abnormality cervical spondylosis with multilevel disc height loss, posterior disc osteophytes, uncovertebral and facet hypertrophy. Other neck: No neck mass or cervical lymphadenopathy. Subcentimeter thyroid nodules not meeting consensus criteria for ultrasound follow-up. Upper chest: No consolidation within the imaged lung apices. Review of the MIP images confirms the above  findings CTA HEAD FINDINGS Anterior circulation: The intracranial internal carotid arteries are patent with scattered calcified plaque. No significant stenosis in these vessels. The M1 middle cerebral arteries are patent without significant stenosis. No M2 proximal branch occlusion or high-grade proximal stenosis is identified. The anterior cerebral arteries are patent. Moderate focal stenosis within a proximal to mid A2 right anterior cerebral artery branch. No intracranial aneurysm is identified. Posterior circulation: The intracranial right vertebral artery is markedly diminutive beyond the origin of the right PICA but appears patent. There is prominent atherosclerotic irregularity of the intracranial left vertebral artery with sites of severe stenosis. There is a paucity of flow within the right PICA shortly beyond its origin. The basilar artery is diminutive with multifocal atherosclerotic irregularity and sites of moderate to moderately severe stenosis. There are sizable bilateral posterior communicating arteries. The P1 left posterior cerebral artery is highly stenotic or developmentally diminutive. Otherwise, no significant proximal stenosis within these vessels. Venous sinuses: Within limitations of contrast timing, no convincing thrombus. Anatomic variants:  As described. Review of the MIP images confirms the above findings These results were called by telephone at the time of interpretation on 09/03/2019 at 5:57 pm to provider DAVID YAO , who verbally acknowledged these results. IMPRESSION: CT head: 1. A moderate-sized acute right PICA vascular territory cerebellar infarct was better appreciated on same-day brain MRI. 2. Redemonstrated chronic infarcts within the left cerebellum. 3. Background mild generalized parenchymal atrophy and chronic small vessel ischemic disease. CTA neck: 1. The bilateral common and internal carotid arteries are patent within the neck without significant stenosis. Mild  atherosclerotic disease within the proximal ICAs. 2. Severe stenosis at the origin of the right vertebral artery. Portions of the proximal V2 right vertebral artery appear highly stenosed or occluded. There is reconstitution of flow within the more distal V2 and V3 segments. 3. The V1 left vertebral artery is occluded. There is reconstitution of flow within the V2 and V3 segments, although with an irregular and diminutive appearance of this vessel. CTA head: 1. The intracranial right vertebral artery is markedly diminutive beyond the origin of the right PICA, although patent. There is a paucity of flow within the right PICA shortly beyond its origin and this vessel is likely occluded given findings on same-day brain MRI. 2. Prominent atherosclerotic irregularity of the intracranial left vertebral artery with sites of severe stenosis. 3. The basilar artery is diminutive with atherosclerotic irregularity and sites of up to moderate/moderately severe stenosis. 4. Sizable bilateral posterior communicating arteries. he P1 left PCA is either developmentally diminutive or severely stenotic. Electronically Signed   By: Kellie Simmering DO   On: 09/03/2019 17:55   CT Angio Neck W and/or Wo Contrast  Result Date: 09/03/2019 CLINICAL DATA:  Carotid artery stenosis. Near syncopal episode today. Dizziness. EXAM: CT ANGIOGRAPHY HEAD AND NECK TECHNIQUE: Multidetector CT imaging of the head and neck was performed using the standard protocol during bolus administration of intravenous contrast. Multiplanar CT image reconstructions and MIPs were obtained to evaluate the vascular anatomy. Carotid stenosis measurements (when applicable) are obtained utilizing NASCET criteria, using the distal internal carotid diameter as the denominator. CONTRAST:  25mL OMNIPAQUE IOHEXOL 350 MG/ML SOLN COMPARISON:  Brain MRI performed earlier the same day 09/03/2019. FINDINGS: CT HEAD FINDINGS Brain: There is no evidence of acute intracranial hemorrhage,  intracranial mass, midline shift or extra-axial fluid collection.No demarcated cortical infarction. A moderate-sized acute infarct within the right cerebellum in the right PICA vascular territory was better appreciated on same-day brain MRI. Redemonstrated chronic infarcts within the left cerebellum. Background mild generalized parenchymal atrophy and chronic small vessel ischemic disease. Vascular: Reported separately. Skull: Normal. Negative for fracture or focal lesion. Sinuses: No significant paranasal sinus disease or mastoid effusion. Orbits: Visualized orbits demonstrate no acute abnormality. Review of the MIP images confirms the above findings CTA NECK FINDINGS Aortic arch: Standard aortic branching. Calcified plaque within the visualized aortic arch and proximal major branch vessels of the neck. No significant innominate or proximal subclavian artery stenosis. Right carotid system: CCA and ICA patent within the neck without significant stenosis (50% or greater). Mild mixed plaque within the proximal ICA. Left carotid system: CCA and ICA patent within the neck without significant stenosis (50% or greater). Minimal mixed plaque within the proximal ICA. Vertebral arteries: Predominantly soft plaque results in severe stenosis at the origin of the right vertebral artery. Portions of the proximal V2 right vertebral artery appears severely stenosed or occluded (for instance as seen on series 11, image 237). More distally, the V2 and  V3 right vertebral artery is patent. The V1 left vertebral artery appears occluded. There is reconstitution of flow within the V2 and left vertebral artery, although with a diminutive appearance of this vessel. Skeleton: No acute bony abnormality cervical spondylosis with multilevel disc height loss, posterior disc osteophytes, uncovertebral and facet hypertrophy. Other neck: No neck mass or cervical lymphadenopathy. Subcentimeter thyroid nodules not meeting consensus criteria for  ultrasound follow-up. Upper chest: No consolidation within the imaged lung apices. Review of the MIP images confirms the above findings CTA HEAD FINDINGS Anterior circulation: The intracranial internal carotid arteries are patent with scattered calcified plaque. No significant stenosis in these vessels. The M1 middle cerebral arteries are patent without significant stenosis. No M2 proximal branch occlusion or high-grade proximal stenosis is identified. The anterior cerebral arteries are patent. Moderate focal stenosis within a proximal to mid A2 right anterior cerebral artery branch. No intracranial aneurysm is identified. Posterior circulation: The intracranial right vertebral artery is markedly diminutive beyond the origin of the right PICA but appears patent. There is prominent atherosclerotic irregularity of the intracranial left vertebral artery with sites of severe stenosis. There is a paucity of flow within the right PICA shortly beyond its origin. The basilar artery is diminutive with multifocal atherosclerotic irregularity and sites of moderate to moderately severe stenosis. There are sizable bilateral posterior communicating arteries. The P1 left posterior cerebral artery is highly stenotic or developmentally diminutive. Otherwise, no significant proximal stenosis within these vessels. Venous sinuses: Within limitations of contrast timing, no convincing thrombus. Anatomic variants: As described. Review of the MIP images confirms the above findings These results were called by telephone at the time of interpretation on 09/03/2019 at 5:57 pm to provider DAVID YAO , who verbally acknowledged these results. IMPRESSION: CT head: 1. A moderate-sized acute right PICA vascular territory cerebellar infarct was better appreciated on same-day brain MRI. 2. Redemonstrated chronic infarcts within the left cerebellum. 3. Background mild generalized parenchymal atrophy and chronic small vessel ischemic disease. CTA neck:  1. The bilateral common and internal carotid arteries are patent within the neck without significant stenosis. Mild atherosclerotic disease within the proximal ICAs. 2. Severe stenosis at the origin of the right vertebral artery. Portions of the proximal V2 right vertebral artery appear highly stenosed or occluded. There is reconstitution of flow within the more distal V2 and V3 segments. 3. The V1 left vertebral artery is occluded. There is reconstitution of flow within the V2 and V3 segments, although with an irregular and diminutive appearance of this vessel. CTA head: 1. The intracranial right vertebral artery is markedly diminutive beyond the origin of the right PICA, although patent. There is a paucity of flow within the right PICA shortly beyond its origin and this vessel is likely occluded given findings on same-day brain MRI. 2. Prominent atherosclerotic irregularity of the intracranial left vertebral artery with sites of severe stenosis. 3. The basilar artery is diminutive with atherosclerotic irregularity and sites of up to moderate/moderately severe stenosis. 4. Sizable bilateral posterior communicating arteries. he P1 left PCA is either developmentally diminutive or severely stenotic. Electronically Signed   By: Kellie Simmering DO   On: 09/03/2019 17:55   MR BRAIN WO CONTRAST  Addendum Date: 09/03/2019   ADDENDUM REPORT: 09/03/2019 17:18 ADDENDUM: These results were called by telephone at the time of interpretation on 09/03/2019 at 5:18 pm to provider DAVID YAO , who verbally acknowledged these results. Electronically Signed   By: Kellie Simmering DO   On: 09/03/2019 17:18  Result Date: 09/03/2019 CLINICAL DATA:  Dizziness, rule out stroke; headache, intracranial hemorrhage suspected. EXAM: MRI HEAD WITHOUT CONTRAST TECHNIQUE: Multiplanar, multiecho pulse sequences of the brain and surrounding structures were obtained without intravenous contrast. COMPARISON:  No pertinent prior studies available for  comparison. FINDINGS: Brain: There is a moderate-sized focus of restricted diffusion within the right cerebellum in the right PICA vascular territory consistent with acute infarction. There is an additional punctate focus of acute infarction within the superior right cerebellum (series 5, image 60). No evidence of intracranial mass. No midline shift or extra-axial fluid collection. No chronic intracranial blood products. Chronic left cerebellar infarcts. Minimal scattered T2/FLAIR hyperintensity within the cerebral white matter is nonspecific, but consistent with chronic small vessel ischemic disease. Moderate generalized parenchymal atrophy. Vascular: Abnormal appearance of the intracranial left vertebral artery which may reflect high-grade stenosis or occlusion. Flow voids otherwise maintained within the proximal large arterial vessels. Skull and upper cervical spine: No focal marrow lesion. Incompletely assessed upper cervical spondylosis. Sinuses/Orbits: Prior right lens replacement. No significant paranasal sinus disease or mastoid effusion IMPRESSION: Moderate-sized acute infarct within the right cerebellum in the right PICA vascular territory. Additional punctate acute infarct more superiorly within the right cerebellum. Chronic left cerebellar infarcts. Background moderate generalized parenchymal atrophy with mild chronic small vessel ischemic disease. Abnormal appearance of the intracranial left vertebral artery which may reflect high-grade stenosis or occlusion. Electronically Signed: By: Kellie Simmering DO On: 09/03/2019 17:11    EKG: Sinus rhythm, premature ventricular contraction, prolonged QT interval, no ST elevation or depression noted.  Assessment/Plan Principal Problem:   CVA (cerebral vascular accident) ([place]) Active Problems:   HLD (hyperlipidemia)   Essential hypertension   Mild intermittent asthma   Right cerebellar infarction: -In right PICA vascular territory-likely the cause of  patient's dizziness and vertigo -admit forTelemetry monitoring -Allow for permissive hypertension for the first 24-48h - only treat PRN if SBP 123456 mmHg or diastolic blood pressure [id number]. Blood pressures can be gradually normalized to SBP<140 upon discharge. -Reviewed MRI, CT angiogram of head and neck. -EDP consulted neurology-appreciate help -ASA given -Ordered echocardiogram to- rule out PFO, Lipid Panel & A1C -Frequent neuro checks -Atorvastatin PO within 24 hrs. -PT/OT eval, Speech consult -We will keep him n.p.o. until he passes the bedside swallow evaluation.   Hypertension: Blood pressure is elevated -We will hold home BP meds-amlodipine and quinapril to allow permissive hypertension and monitor blood pressure closely  Hyperlipidemia: Check lipid panel continue statin  Mild intermittent asthma: -No wheezing on exam, on room air.  Continue albuterol inhaler as needed.  Hypokalemia: Replenished.  Repeat BMP tomorrow a.m.  DVT prophylaxis: Lovenox. TED/SCD Code Status: Full code-confirmed with the patient  family Communication: None present at bedside.  Plan of care discussed with patient in length and he verbalized understanding and agreed with it. Disposition Plan: TBD Consults called: Neurology by EDP Admission status: Inpatient  Mckinley Jewel MD [facility] Pager [phone]  If 7PM-7AM, please contact night-coverage [email or web address] Password [facility]  09/03/2019, 7:02 PM

## 2019-09-03 NOTE — Consult Note (Addendum)
Neurology Consultation  Reason for Consult: Cerebellar stroke Referring Physician: Dr. Darl Householder  CC: Near syncope  History is obtained from: patient, chart  HPI: Elijah [name] is a 85 y.o. male with PMH of HTN, HLD, BPH, asthma, arthritis, hearing loss, and hx of melanoma presents via EMS following a near syncope event in which pt was changing out of his clothes when suddenly he felt dizzy, diaphoretic and had to lower himself to the ground. Patient did not collapse, no trauma to head or otherwise. Pt endorsed nausea and allergy/sinus related headache in addition to above stated symptoms, no other complaints.   MRI brain was performed which revealed moderate sized acute infarct of R cerebellum in the R PICA vascular territory + punctate acute infarcts superior to R cerebellum + abnormal appearing L vertebral artery concerning for high grade stenosis or occlusion.   Neurology team was consulted to admit patient for stroke work up. On assessment, patient was neurologically intact, requesting to eat, reports he no longer has any nausea, just feels weak and slightly dizzy.   ED course as above Relevant labs include - Hypokalemia 3.3 UA- + ketones  MRI brain shows- Acute R cerebellar infarct + stenosis vs occlusion of L vertebral artery CTA head/neck- pending  Work up that has been done: as above  LKW: 12.30 pm tpa given?: no, outside window at time of assessment    Past Medical History:  Diagnosis Date  . Allergy   . Arthritis    hands  . Asthma   . Elevated PSA    s/p neg biopsy  . Enlarged prostate   . Hyperlipidemia   . Hypertension   . Wears hearing aid    1-No significant post stroke disability and can perform usual duties with stroke symptoms Essential (primary) hypertension and Hypokalemia   Family History  Problem Relation Age of Onset  . Heart disease Father   . Stroke Mother   . Cancer Sister     Social History:   reports that he has never smoked. He has never  used smokeless tobacco. He reports current alcohol use of about 14.0 standard drinks of alcohol per week. He reports that he does not use drugs.  Medications No current facility-administered medications for this encounter.  Current Outpatient Medications:  .  albuterol (VENTOLIN HFA) 108 (90 Base) MCG/ACT inhaler, Inhale 1-2 puffs into the lungs every 4 (four) hours as needed for wheezing or shortness of breath., Disp: , Rfl:  .  amLODipine (NORVASC) 5 MG tablet, Take 1 tablet (5 mg total) by mouth daily., Disp: 90 tablet, Rfl: 2 .  Calcium Carbonate-Vitamin D (TH CALCIUM CARBONATE-VITAMIN D) 600-400 MG-UNIT per tablet, Take 1 tablet by mouth daily.  , Disp: , Rfl:  .  fluticasone (FLONASE) 50 MCG/ACT nasal spray, USE 2 SPRAYS IN EACH NOSTRIL DURING ALLERGY SEASON., Disp: 16 g, Rfl: 6 .  pseudoephedrine (SUDAFED) 30 MG tablet, Take 30 mg by mouth as needed.  , Disp: , Rfl:  .  quinapril (ACCUPRIL) 10 MG tablet, TAKE 1 TABLET BY MOUTH EVERY DAY, Disp: 90 tablet, Rfl: 2 .  simvastatin (ZOCOR) 20 MG tablet, TAKE 1 TABLET BY MOUTH EVERY DAY, Disp: 90 tablet, Rfl: 0  ROS:  Unable to obtain due to altered mental status.   General ROS: negative for - chills, fatigue, fever, night sweats, weight gain or weight loss Psychological ROS: negative for - behavioral disorder, hallucinations, memory difficulties, mood swings or suicidal ideation Ophthalmic ROS: negative for - blurry vision, double  vision, eye pain or loss of vision ENT ROS: negative for - epistaxis, nasal discharge, oral lesions, sore throat, tinnitus. (+) vertigo Allergy and Immunology ROS: negative for - hives or itchy/watery eyes Hematological and Lymphatic ROS: negative for - bleeding problems, bruising or swollen lymph nodes Endocrine ROS: negative for - galactorrhea, hair pattern changes, polydipsia/polyuria or temperature intolerance Respiratory ROS: negative for - cough, hemoptysis, shortness of breath or wheezing Cardiovascular ROS:  negative for - chest pain, dyspnea on exertion, edema or irregular heartbeat Gastrointestinal ROS: negative for - abdominal pain, diarrhea, hematemesis, vomiting or stool incontinence. (+) nausea Genito-Urinary ROS: negative for - dysuria, hematuria, incontinence or urinary frequency/urgency Musculoskeletal ROS: negative for - joint swelling or muscular weakness Neurological ROS: as noted in HPI Dermatological ROS: negative for rash and skin lesion changes  Exam: Current vital signs: BP (!) 172/95   Pulse 95   Temp (!) 97.5 F (36.4 C) (Oral)   Resp 16   SpO2 99%  Vital signs in last 24 hours: Temp:  [97.5 F (36.4 C)] 97.5 F (36.4 C) (03/07 1456) Pulse Rate:  [87-106] 95 (03/07 1600) Resp:  [10-16] 16 (03/07 1524) BP: (172-200)/(95-113) 172/95 (03/07 1600) SpO2:  [94 %-100 %] 99 % (03/07 1600)   Constitutional: Appears well-developed and well-nourished.  Psych: Affect appropriate to situation Eyes: No scleral injection HENT: No OP obstrucion Head: Normocephalic.  Cardiovascular: Normal rate and regular rhythm.  Respiratory: Effort normal, non-labored breathing GI: Soft.  No distension. There is no tenderness.  Skin: WDI  Neuro: Mental Status: Patient is awake, alert, oriented to person, place, month, year, and situation. Speech intact to naming, repeating, comprehension Patient is able to give a clear and coherent history. Cranial Nerves: II: Visual Fields are full.  III,IV, VI: EOMI without ptosis or diploplia. Pupils equal, round and reactive to light.  Mild horizontal nystagmus and rotatory nystagmus when looking straight.  Test of skew positive V: Facial sensation is symmetric to temperature VII: Facial movement is symmetric.  VIII: hearing is intact to voice X: Palat elevates symmetrically XI: Shoulder shrug is symmetric. XII: tongue is midline without atrophy or fasciculations.  Motor: Tone is normal. Bulk is normal. 5/5 strength was present in all four  extremities.  Drift- none aterixis- n/a Sensory: Sensation is symmetric to light touch and temperature in the arms and legs. DSS- intact Deep Tendon Reflexes: 2+ and symmetric in the biceps and patellae.  Plantars: Toes are downgoing bilaterally.  Cerebellar: FNF and HKS are intact bilaterally Not assessed gait due to dizziness and nausea  NIHSS 0  Labs I have reviewed labs in epic and the results pertinent to this consultation are:  CBC    Component Value Date/Time   WBC 10.3 09/03/2019 1508   RBC 5.42 09/03/2019 1508   HGB 17.7 (H) 09/03/2019 1508   HCT 48.3 09/03/2019 1508   PLT 220 09/03/2019 1508   MCV 89.1 09/03/2019 1508   MCH 32.7 09/03/2019 1508   MCHC 36.6 (H) 09/03/2019 1508   RDW 13.2 09/03/2019 1508   LYMPHSABS 0.9 09/03/2019 1508   MONOABS 0.8 09/03/2019 1508   EOSABS 0.0 09/03/2019 1508   BASOSABS 0.1 09/03/2019 1508    CMP     Component Value Date/Time   NA 140 09/03/2019 1508   K 3.3 (L) 09/03/2019 1508   CL 106 09/03/2019 1508   CO2 21 (L) 09/03/2019 1508   GLUCOSE 138 (H) 09/03/2019 1508   BUN 15 09/03/2019 1508   CREATININE 1.12 09/03/2019 1508  CALCIUM 9.6 09/03/2019 1508   PROT 7.6 09/03/2019 1508   ALBUMIN 4.4 09/03/2019 1508   AST 24 09/03/2019 1508   ALT 17 09/03/2019 1508   ALKPHOS 67 09/03/2019 1508   BILITOT 1.6 (H) 09/03/2019 1508   GFRNONAA >60 09/03/2019 1508   GFRAA >60 09/03/2019 1508    Lipid Panel     Component Value Date/Time   CHOL 175 02/27/2019 1420   TRIG 105.0 02/27/2019 1420   HDL 74.50 02/27/2019 1420   CHOLHDL 2 02/27/2019 1420   VLDL 21.0 02/27/2019 1420   LDLCALC 79 02/27/2019 1420   LDLDIRECT 87.0 02/27/2019 1420     Imaging I have reviewed the images obtained:  CTA head/neck pending  MRI examination of the brain - Moderate-sized acute infarct within the right cerebellum in the right PICA vascular territory. Additional punctate acute infarct more superiorly within the right cerebellum. -  Chronic left cerebellar infarcts. - Background moderate generalized parenchymal atrophy with mild chronic small vessel ischemic disease. - Abnormal appearance of the intracranial left vertebral artery which may reflect high-grade stenosis or occlusion.     Posey Pronto PA-C [facility] [phone]  NEUROHOSPITALIST ADDENDUM Performed a face to face diagnostic evaluation.   I have reviewed the contents of history and physical exam as documented by PA/ARNP/Resident and agree with above documentation.  I have discussed and formulated the above plan as documented. Edits to the note have been made as needed.   Assessment:  85 YO M with PMH of HTN, HLD presenting with dizziness, nausea, headache, and near syncope episode found to have acute R cerebellar infarct in R PICA territory on MRI brain. Patient imaging also shows chronic L cerebellar infarcts.  Neurology was consulted around 5:30 PM after MRI showed acute stroke.   CT angiogram of the neck shows severe stenosis in the origin of the right vertebral artery but more patent distally.  Left vertebral artery appears occluded at the V1 segment with reconstitution at the V2 level.  CT angiogram of the head shows occlusion at the right PICA, a diminutive basilar with multifocal atherosclerotic disease and left vertebral artery with severe stenosis.  On exam, patient has nystagmus and test of skew is positive.  Did not assess gait as patient complaining of dizziness which is improved after Reglan.  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   You can look them up on [email or web address]  Password [facility]      Karena Addison Keni Wafer MD [facility] [phone]   If 7pm to 7am, please call on call as listed on AMION.

## 2019-09-03 NOTE — ED Notes (Signed)
Attempted to call report a second time. Nurse unavailable.

## 2019-09-03 NOTE — ED Notes (Addendum)
Remo Lipps (son)-- outside if needed. Number in chart.

## 2019-09-03 NOTE — ED Provider Notes (Signed)
[place] EMERGENCY DEPARTMENT Provider Note   CSN: [id number] Arrival date & time: 09/03/19  1448     History Chief Complaint  Patient presents with  . Near Syncope  . Hypertension    Tae Librizzi is a 85 y.o. male hx of HTN, HL, here presenting with dizziness.  Patient states that he just got home from church and was trying to undress himself and had a sudden onset of dizziness.  He describes as room spinning and he was unable to open his eyes and had to lay down.  He states that he feels nauseated has some headache as well.  He denies any history of stroke or trouble speaking or weakness or numbness.  The history is provided by the patient.       Past Medical History:  Diagnosis Date  . Allergy   . Arthritis    hands  . Asthma   . Elevated PSA    s/p neg biopsy  . Enlarged prostate   . Hyperlipidemia   . Hypertension   . Wears hearing aid     Patient Active Problem List   Diagnosis Date Noted  . CVA (cerebral vascular accident) ([place])   . Encounter for screening for malignant neoplasm of prostate  02/27/2019  . Need for influenza vaccination 02/27/2019  . BPH with elevated PSA 02/26/2019  . Age-related nuclear cataract, left eye 02/26/2019  . History of melanoma 12/06/2014  . Arthritis 02/19/2014  . Hyperglycemia 01/20/2011  . Vitamin D deficiency 11/04/2009  . HLD (hyperlipidemia) 11/04/2009  . Essential hypertension 11/04/2009  . ALLERGIC RHINITIS 11/04/2009  . ASTHMA 11/04/2009    Past Surgical History:  Procedure Laterality Date  . CATARACT EXTRACTION W/PHACO Right 05/08/2015   Procedure: CATARACT EXTRACTION PHACO AND INTRAOCULAR LENS PLACEMENT (IOC);  Surgeon: Leandrew Koyanagi, MD;  Location: [facility];  Service: Ophthalmology;  Laterality: Right;  . PROSTATE BIOPSY    . TONSILLECTOMY         Family History  Problem Relation Age of Onset  . Heart disease Father   . Stroke Mother   . Cancer Sister     Social  History   Tobacco Use  . Smoking status: Never Smoker  . Smokeless tobacco: Never Used  Substance Use Topics  . Alcohol use: Yes    Alcohol/week: 14.0 standard drinks    Types: 14 Shots of liquor per week  . Drug use: No    Home Medications Prior to Admission medications   Medication Sig Start Date End Date Taking? Authorizing Provider  albuterol (VENTOLIN HFA) 108 (90 Base) MCG/ACT inhaler Inhale 1-2 puffs into the lungs every 4 (four) hours as needed for wheezing or shortness of breath.   Yes [provider]  amLODipine (NORVASC) 5 MG tablet Take 1 tablet (5 mg total) by mouth daily. 06/09/19  Yes Lucille Passy, MD  cholecalciferol (VITAMIN D3) 25 MCG (1000 UNIT) tablet Take 2,000 Units by mouth daily.   Yes [provider]  fluticasone (FLONASE) 50 MCG/ACT nasal spray USE 2 SPRAYS IN EACH NOSTRIL DURING ALLERGY SEASON. 08/05/18  Yes Lucille Passy, MD  pseudoephedrine (SUDAFED) 30 MG tablet Take 30 mg by mouth as needed.     Yes [provider]  quinapril (ACCUPRIL) 10 MG tablet TAKE 1 TABLET BY MOUTH EVERY DAY Patient taking differently: Take 10 mg by mouth daily. TAKE 1 TABLET BY MOUTH EVERY DAY 03/13/19  Yes Lucille Passy, MD  simvastatin (ZOCOR) 20 MG tablet  TAKE 1 TABLET BY MOUTH EVERY DAY Patient taking differently: Take 20 mg by mouth daily at 6 PM. Pt states he "chips some off of tablet" dose unknown. 12/14/18  Yes Lucille Passy, MD  Calcium Carbonate-Vitamin D (TH CALCIUM CARBONATE-VITAMIN D) 600-400 MG-UNIT per tablet Take 1 tablet by mouth daily.      [provider]    Allergies    Penicillins  Review of Systems   Review of Systems  Cardiovascular: Positive for near-syncope.  Neurological: Positive for dizziness.  All other systems reviewed and are negative.   Physical Exam Updated Vital Signs BP (!) 172/82   Pulse 95   Temp (!) 97.5 F (36.4 C) (Oral)   Resp 10   SpO2 99%   Physical Exam Vitals and nursing note reviewed.    Constitutional:      Comments: Uncomfortable, nauseated   HENT:     Head: Normocephalic.     Nose: Nose normal.     Mouth/Throat:     Mouth: Mucous membranes are moist.  Eyes:     Comments: Rotatory nystagmus to the left, not changing with direction   Cardiovascular:     Rate and Rhythm: Normal rate and regular rhythm.     Pulses: Normal pulses.     Heart sounds: Normal heart sounds.  Pulmonary:     Effort: Pulmonary effort is normal.     Breath sounds: Normal breath sounds.  Abdominal:     General: Abdomen is flat.     Palpations: Abdomen is soft.  Musculoskeletal:        General: Normal range of motion.     Cervical back: Normal range of motion.  Skin:    General: Skin is warm.     Capillary Refill: Capillary refill takes less than 2 seconds.  Neurological:     General: No focal deficit present.     Mental Status: He is alert and oriented to person, place, and time.     Comments: CN 2- 12 intact, nl strength and sensation throughout, nl finger to nose bilaterally   Psychiatric:        Mood and Affect: Mood normal.        Behavior: Behavior normal.     ED Results / Procedures / Treatments   Labs (all labs ordered are listed, but only abnormal results are displayed) Labs Reviewed  CBC WITH DIFFERENTIAL/PLATELET - Abnormal; Notable for the following components:      Result Value   Hemoglobin 17.7 (*)    MCHC 36.6 (*)    Neutro Abs 8.5 (*)    Abs Immature Granulocytes 0.10 (*)    All other components within normal limits  COMPREHENSIVE METABOLIC PANEL - Abnormal; Notable for the following components:   Potassium 3.3 (*)    CO2 21 (*)    Glucose, Bld 138 (*)    Total Bilirubin 1.6 (*)    All other components within normal limits  URINALYSIS, ROUTINE W REFLEX MICROSCOPIC - Abnormal; Notable for the following components:   Ketones, ur 5 (*)    All other components within normal limits  SARS CORONAVIRUS 2 (TAT 6-24 HRS)  TROPONIN I (HIGH SENSITIVITY)  TROPONIN I  (HIGH SENSITIVITY)    EKG EKG Interpretation  Date/Time:  Sunday September 03 2019 14:57:10 EST Ventricular Rate:  88 PR Interval:    QRS Duration: 104 QT Interval:  414 QTC Calculation: 501 R Axis:   45 Text Interpretation: Sinus rhythm Ventricular premature complex Low voltage, precordial  leads Prolonged QT interval No previous ECGs available Confirmed by Wandra Arthurs [id number]) on 09/03/2019 3:06:51 PM   Radiology CT Angio Head W or Wo Contrast  Result Date: 09/03/2019 CLINICAL DATA:  Carotid artery stenosis. Near syncopal episode today. Dizziness. EXAM: CT ANGIOGRAPHY HEAD AND NECK TECHNIQUE: Multidetector CT imaging of the head and neck was performed using the standard protocol during bolus administration of intravenous contrast. Multiplanar CT image reconstructions and MIPs were obtained to evaluate the vascular anatomy. Carotid stenosis measurements (when applicable) are obtained utilizing NASCET criteria, using the distal internal carotid diameter as the denominator. CONTRAST:  55mL OMNIPAQUE IOHEXOL 350 MG/ML SOLN COMPARISON:  Brain MRI performed earlier the same day 09/03/2019. FINDINGS: CT HEAD FINDINGS Brain: There is no evidence of acute intracranial hemorrhage, intracranial mass, midline shift or extra-axial fluid collection.No demarcated cortical infarction. A moderate-sized acute infarct within the right cerebellum in the right PICA vascular territory was better appreciated on same-day brain MRI. Redemonstrated chronic infarcts within the left cerebellum. Background mild generalized parenchymal atrophy and chronic small vessel ischemic disease. Vascular: Reported separately. Skull: Normal. Negative for fracture or focal lesion. Sinuses: No significant paranasal sinus disease or mastoid effusion. Orbits: Visualized orbits demonstrate no acute abnormality. Review of the MIP images confirms the above findings CTA NECK FINDINGS Aortic arch: Standard aortic branching. Calcified plaque within the  visualized aortic arch and proximal major branch vessels of the neck. No significant innominate or proximal subclavian artery stenosis. Right carotid system: CCA and ICA patent within the neck without significant stenosis (50% or greater). Mild mixed plaque within the proximal ICA. Left carotid system: CCA and ICA patent within the neck without significant stenosis (50% or greater). Minimal mixed plaque within the proximal ICA. Vertebral arteries: Predominantly soft plaque results in severe stenosis at the origin of the right vertebral artery. Portions of the proximal V2 right vertebral artery appears severely stenosed or occluded (for instance as seen on series 11, image 237). More distally, the V2 and V3 right vertebral artery is patent. The V1 left vertebral artery appears occluded. There is reconstitution of flow within the V2 and left vertebral artery, although with a diminutive appearance of this vessel. Skeleton: No acute bony abnormality cervical spondylosis with multilevel disc height loss, posterior disc osteophytes, uncovertebral and facet hypertrophy. Other neck: No neck mass or cervical lymphadenopathy. Subcentimeter thyroid nodules not meeting consensus criteria for ultrasound follow-up. Upper chest: No consolidation within the imaged lung apices. Review of the MIP images confirms the above findings CTA HEAD FINDINGS Anterior circulation: The intracranial internal carotid arteries are patent with scattered calcified plaque. No significant stenosis in these vessels. The M1 middle cerebral arteries are patent without significant stenosis. No M2 proximal branch occlusion or high-grade proximal stenosis is identified. The anterior cerebral arteries are patent. Moderate focal stenosis within a proximal to mid A2 right anterior cerebral artery branch. No intracranial aneurysm is identified. Posterior circulation: The intracranial right vertebral artery is markedly diminutive beyond the origin of the right  PICA but appears patent. There is prominent atherosclerotic irregularity of the intracranial left vertebral artery with sites of severe stenosis. There is a paucity of flow within the right PICA shortly beyond its origin. The basilar artery is diminutive with multifocal atherosclerotic irregularity and sites of moderate to moderately severe stenosis. There are sizable bilateral posterior communicating arteries. The P1 left posterior cerebral artery is highly stenotic or developmentally diminutive. Otherwise, no significant proximal stenosis within these vessels. Venous sinuses: Within limitations of contrast timing, no  convincing thrombus. Anatomic variants: As described. Review of the MIP images confirms the above findings These results were called by telephone at the time of interpretation on 09/03/2019 at 5:57 pm to provider Cynthia Stainback , who verbally acknowledged these results. IMPRESSION: CT head: 1. A moderate-sized acute right PICA vascular territory cerebellar infarct was better appreciated on same-day brain MRI. 2. Redemonstrated chronic infarcts within the left cerebellum. 3. Background mild generalized parenchymal atrophy and chronic small vessel ischemic disease. CTA neck: 1. The bilateral common and internal carotid arteries are patent within the neck without significant stenosis. Mild atherosclerotic disease within the proximal ICAs. 2. Severe stenosis at the origin of the right vertebral artery. Portions of the proximal V2 right vertebral artery appear highly stenosed or occluded. There is reconstitution of flow within the more distal V2 and V3 segments. 3. The V1 left vertebral artery is occluded. There is reconstitution of flow within the V2 and V3 segments, although with an irregular and diminutive appearance of this vessel. CTA head: 1. The intracranial right vertebral artery is markedly diminutive beyond the origin of the right PICA, although patent. There is a paucity of flow within the right PICA  shortly beyond its origin and this vessel is likely occluded given findings on same-day brain MRI. 2. Prominent atherosclerotic irregularity of the intracranial left vertebral artery with sites of severe stenosis. 3. The basilar artery is diminutive with atherosclerotic irregularity and sites of up to moderate/moderately severe stenosis. 4. Sizable bilateral posterior communicating arteries. he P1 left PCA is either developmentally diminutive or severely stenotic. Electronically Signed   By: Kellie Simmering DO   On: 09/03/2019 17:55   CT Angio Neck W and/or Wo Contrast  Result Date: 09/03/2019 CLINICAL DATA:  Carotid artery stenosis. Near syncopal episode today. Dizziness. EXAM: CT ANGIOGRAPHY HEAD AND NECK TECHNIQUE: Multidetector CT imaging of the head and neck was performed using the standard protocol during bolus administration of intravenous contrast. Multiplanar CT image reconstructions and MIPs were obtained to evaluate the vascular anatomy. Carotid stenosis measurements (when applicable) are obtained utilizing NASCET criteria, using the distal internal carotid diameter as the denominator. CONTRAST:  100mL OMNIPAQUE IOHEXOL 350 MG/ML SOLN COMPARISON:  Brain MRI performed earlier the same day 09/03/2019. FINDINGS: CT HEAD FINDINGS Brain: There is no evidence of acute intracranial hemorrhage, intracranial mass, midline shift or extra-axial fluid collection.No demarcated cortical infarction. A moderate-sized acute infarct within the right cerebellum in the right PICA vascular territory was better appreciated on same-day brain MRI. Redemonstrated chronic infarcts within the left cerebellum. Background mild generalized parenchymal atrophy and chronic small vessel ischemic disease. Vascular: Reported separately. Skull: Normal. Negative for fracture or focal lesion. Sinuses: No significant paranasal sinus disease or mastoid effusion. Orbits: Visualized orbits demonstrate no acute abnormality. Review of the MIP images  confirms the above findings CTA NECK FINDINGS Aortic arch: Standard aortic branching. Calcified plaque within the visualized aortic arch and proximal major branch vessels of the neck. No significant innominate or proximal subclavian artery stenosis. Right carotid system: CCA and ICA patent within the neck without significant stenosis (50% or greater). Mild mixed plaque within the proximal ICA. Left carotid system: CCA and ICA patent within the neck without significant stenosis (50% or greater). Minimal mixed plaque within the proximal ICA. Vertebral arteries: Predominantly soft plaque results in severe stenosis at the origin of the right vertebral artery. Portions of the proximal V2 right vertebral artery appears severely stenosed or occluded (for instance as seen on series 11, image 237). More  distally, the V2 and V3 right vertebral artery is patent. The V1 left vertebral artery appears occluded. There is reconstitution of flow within the V2 and left vertebral artery, although with a diminutive appearance of this vessel. Skeleton: No acute bony abnormality cervical spondylosis with multilevel disc height loss, posterior disc osteophytes, uncovertebral and facet hypertrophy. Other neck: No neck mass or cervical lymphadenopathy. Subcentimeter thyroid nodules not meeting consensus criteria for ultrasound follow-up. Upper chest: No consolidation within the imaged lung apices. Review of the MIP images confirms the above findings CTA HEAD FINDINGS Anterior circulation: The intracranial internal carotid arteries are patent with scattered calcified plaque. No significant stenosis in these vessels. The M1 middle cerebral arteries are patent without significant stenosis. No M2 proximal branch occlusion or high-grade proximal stenosis is identified. The anterior cerebral arteries are patent. Moderate focal stenosis within a proximal to mid A2 right anterior cerebral artery branch. No intracranial aneurysm is identified.  Posterior circulation: The intracranial right vertebral artery is markedly diminutive beyond the origin of the right PICA but appears patent. There is prominent atherosclerotic irregularity of the intracranial left vertebral artery with sites of severe stenosis. There is a paucity of flow within the right PICA shortly beyond its origin. The basilar artery is diminutive with multifocal atherosclerotic irregularity and sites of moderate to moderately severe stenosis. There are sizable bilateral posterior communicating arteries. The P1 left posterior cerebral artery is highly stenotic or developmentally diminutive. Otherwise, no significant proximal stenosis within these vessels. Venous sinuses: Within limitations of contrast timing, no convincing thrombus. Anatomic variants: As described. Review of the MIP images confirms the above findings These results were called by telephone at the time of interpretation on 09/03/2019 at 5:57 pm to provider Walter Min , who verbally acknowledged these results. IMPRESSION: CT head: 1. A moderate-sized acute right PICA vascular territory cerebellar infarct was better appreciated on same-day brain MRI. 2. Redemonstrated chronic infarcts within the left cerebellum. 3. Background mild generalized parenchymal atrophy and chronic small vessel ischemic disease. CTA neck: 1. The bilateral common and internal carotid arteries are patent within the neck without significant stenosis. Mild atherosclerotic disease within the proximal ICAs. 2. Severe stenosis at the origin of the right vertebral artery. Portions of the proximal V2 right vertebral artery appear highly stenosed or occluded. There is reconstitution of flow within the more distal V2 and V3 segments. 3. The V1 left vertebral artery is occluded. There is reconstitution of flow within the V2 and V3 segments, although with an irregular and diminutive appearance of this vessel. CTA head: 1. The intracranial right vertebral artery is  markedly diminutive beyond the origin of the right PICA, although patent. There is a paucity of flow within the right PICA shortly beyond its origin and this vessel is likely occluded given findings on same-day brain MRI. 2. Prominent atherosclerotic irregularity of the intracranial left vertebral artery with sites of severe stenosis. 3. The basilar artery is diminutive with atherosclerotic irregularity and sites of up to moderate/moderately severe stenosis. 4. Sizable bilateral posterior communicating arteries. he P1 left PCA is either developmentally diminutive or severely stenotic. Electronically Signed   By: Kellie Simmering DO   On: 09/03/2019 17:55   MR BRAIN WO CONTRAST  Addendum Date: 09/03/2019   ADDENDUM REPORT: 09/03/2019 17:18 ADDENDUM: These results were called by telephone at the time of interpretation on 09/03/2019 at 5:18 pm to provider Ronell Boldin , who verbally acknowledged these results. Electronically Signed   By: Kellie Simmering DO   On:  09/03/2019 17:18   Result Date: 09/03/2019 CLINICAL DATA:  Dizziness, rule out stroke; headache, intracranial hemorrhage suspected. EXAM: MRI HEAD WITHOUT CONTRAST TECHNIQUE: Multiplanar, multiecho pulse sequences of the brain and surrounding structures were obtained without intravenous contrast. COMPARISON:  No pertinent prior studies available for comparison. FINDINGS: Brain: There is a moderate-sized focus of restricted diffusion within the right cerebellum in the right PICA vascular territory consistent with acute infarction. There is an additional punctate focus of acute infarction within the superior right cerebellum (series 5, image 60). No evidence of intracranial mass. No midline shift or extra-axial fluid collection. No chronic intracranial blood products. Chronic left cerebellar infarcts. Minimal scattered T2/FLAIR hyperintensity within the cerebral white matter is nonspecific, but consistent with chronic small vessel ischemic disease. Moderate generalized  parenchymal atrophy. Vascular: Abnormal appearance of the intracranial left vertebral artery which may reflect high-grade stenosis or occlusion. Flow voids otherwise maintained within the proximal large arterial vessels. Skull and upper cervical spine: No focal marrow lesion. Incompletely assessed upper cervical spondylosis. Sinuses/Orbits: Prior right lens replacement. No significant paranasal sinus disease or mastoid effusion IMPRESSION: Moderate-sized acute infarct within the right cerebellum in the right PICA vascular territory. Additional punctate acute infarct more superiorly within the right cerebellum. Chronic left cerebellar infarcts. Background moderate generalized parenchymal atrophy with mild chronic small vessel ischemic disease. Abnormal appearance of the intracranial left vertebral artery which may reflect high-grade stenosis or occlusion. Electronically Signed: By: Kellie Simmering DO On: 09/03/2019 17:11    Procedures Procedures (including critical care time)  Medications Ordered in ED Medications  aspirin tablet 325 mg (has no administration in time range)  meclizine (ANTIVERT) tablet 25 mg (25 mg Oral Given 09/03/19 1533)  metoCLOPramide (REGLAN) injection 10 mg (10 mg Intravenous Given 09/03/19 1533)  diphenhydrAMINE (BENADRYL) injection 12.5 mg (12.5 mg Intravenous Given 09/03/19 1533)  iohexol (OMNIPAQUE) 350 MG/ML injection 50 mL (50 mLs Intravenous Contrast Given 09/03/19 1725)    ED Course  I have reviewed the triage vital signs and the nursing notes.  Pertinent labs & imaging results that were available during my care of the patient were reviewed by me and considered in my medical decision making (see chart for details).    MDM Rules/Calculators/A&P                      Dayon Feehan is a 85 y.o. male here with dizziness. Likely peripheral vertigo but given age and acute onset, will get CTA head/neck to r/o dissection and MRI brain to r/o stroke. Will give meclizine for  symptomatic relief.   6:38 PM CTA showed R PICA occlusion and diffuse vertebral disease. Has R cerebellar infarct as well. Neuro consulted and hospitalist to admit for acute stroke.  Final Clinical Impression(s) / ED Diagnoses Final diagnoses:  Cerebrovascular accident (CVA), unspecified mechanism [facility])    Rx / [facility] Orders ED Discharge Orders    None       Drenda Freeze, MD 09/03/19 Bosie Helper

## 2019-09-03 NOTE — ED Notes (Signed)
Pt unable to stand due to dizziness.

## 2019-09-04 ENCOUNTER — Observation Stay (HOSPITAL_BASED_OUTPATIENT_CLINIC_OR_DEPARTMENT_OTHER): Payer: Medicare Other

## 2019-09-04 DIAGNOSIS — I6389 Other cerebral infarction: Secondary | ICD-10-CM | POA: Diagnosis not present

## 2019-09-04 DIAGNOSIS — I10 Essential (primary) hypertension: Secondary | ICD-10-CM | POA: Diagnosis present

## 2019-09-04 DIAGNOSIS — Z823 Family history of stroke: Secondary | ICD-10-CM | POA: Diagnosis not present

## 2019-09-04 DIAGNOSIS — E876 Hypokalemia: Secondary | ICD-10-CM | POA: Diagnosis present

## 2019-09-04 DIAGNOSIS — J4520 Mild intermittent asthma, uncomplicated: Secondary | ICD-10-CM | POA: Diagnosis present

## 2019-09-04 DIAGNOSIS — Z8249 Family history of ischemic heart disease and other diseases of the circulatory system: Secondary | ICD-10-CM | POA: Diagnosis not present

## 2019-09-04 DIAGNOSIS — R5381 Other malaise: Secondary | ICD-10-CM | POA: Diagnosis present

## 2019-09-04 DIAGNOSIS — R42 Dizziness and giddiness: Secondary | ICD-10-CM | POA: Diagnosis present

## 2019-09-04 DIAGNOSIS — I639 Cerebral infarction, unspecified: Secondary | ICD-10-CM | POA: Diagnosis not present

## 2019-09-04 DIAGNOSIS — Z20822 Contact with and (suspected) exposure to covid-19: Secondary | ICD-10-CM | POA: Diagnosis present

## 2019-09-04 DIAGNOSIS — H5509 Other forms of nystagmus: Secondary | ICD-10-CM | POA: Diagnosis present

## 2019-09-04 DIAGNOSIS — R61 Generalized hyperhidrosis: Secondary | ICD-10-CM | POA: Diagnosis present

## 2019-09-04 DIAGNOSIS — I63541 Cerebral infarction due to unspecified occlusion or stenosis of right cerebellar artery: Secondary | ICD-10-CM | POA: Diagnosis not present

## 2019-09-04 DIAGNOSIS — R29700 NIHSS score 0: Secondary | ICD-10-CM | POA: Diagnosis present

## 2019-09-04 DIAGNOSIS — I63231 Cerebral infarction due to unspecified occlusion or stenosis of right carotid arteries: Secondary | ICD-10-CM | POA: Diagnosis present

## 2019-09-04 DIAGNOSIS — E785 Hyperlipidemia, unspecified: Secondary | ICD-10-CM | POA: Diagnosis present

## 2019-09-04 DIAGNOSIS — R2681 Unsteadiness on feet: Secondary | ICD-10-CM | POA: Diagnosis present

## 2019-09-04 DIAGNOSIS — N400 Enlarged prostate without lower urinary tract symptoms: Secondary | ICD-10-CM | POA: Diagnosis present

## 2019-09-04 DIAGNOSIS — H9190 Unspecified hearing loss, unspecified ear: Secondary | ICD-10-CM | POA: Diagnosis present

## 2019-09-04 DIAGNOSIS — I672 Cerebral atherosclerosis: Secondary | ICD-10-CM | POA: Diagnosis present

## 2019-09-04 DIAGNOSIS — Z85820 Personal history of malignant melanoma of skin: Secondary | ICD-10-CM | POA: Diagnosis not present

## 2019-09-04 LAB — BASIC METABOLIC PANEL
Anion gap: 10 (ref 5–15)
BUN: 9 mg/dL (ref 8–23)
CO2: 22 mmol/L (ref 22–32)
Calcium: 9.4 mg/dL (ref 8.9–10.3)
Chloride: 108 mmol/L (ref 98–111)
Creatinine, Ser: 1.06 mg/dL (ref 0.61–1.24)
GFR calc Af Amer: >60 mL/min (ref >60)
GFR calc non Af Amer: >60 mL/min (ref >60)
Glucose, Bld: 117 mg/dL — ABNORMAL HIGH (ref 70–99)
Potassium: 3.7 mmol/L (ref 3.5–5.1)
Sodium: 140 mmol/L (ref 135–145)

## 2019-09-04 LAB — CBC
HCT: 45.6 % (ref 39.0–52.0)
Hemoglobin: 16.5 g/dL (ref 13.0–17.0)
MCH: 32.5 pg (ref 26.0–34.0)
MCHC: 36.2 g/dL — ABNORMAL HIGH (ref 30.0–36.0)
MCV: 89.8 fL (ref 80.0–100.0)
Platelets: 210 10*3/uL (ref 150–400)
RBC: 5.08 MIL/uL (ref 4.22–5.81)
RDW: 13.3 % (ref 11.5–15.5)
WBC: 10.2 10*3/uL (ref 4.0–10.5)
nRBC: 0 % (ref 0.0–0.2)

## 2019-09-04 LAB — ECHOCARDIOGRAM COMPLETE

## 2019-09-04 MED ORDER — QUINAPRIL HCL 10 MG PO TABS: 10.0000 mg | ORAL_TABLET | Freq: Every day | ORAL | Status: DC

## 2019-09-04 MED ORDER — CLOPIDOGREL BISULFATE 75 MG PO TABS: 75.0000 mg | ORAL_TABLET | Freq: Every day | ORAL | 2 refills | Status: AC

## 2019-09-04 MED ORDER — AMLODIPINE BESYLATE 5 MG PO TABS: 5.0000 mg | ORAL_TABLET | Freq: Every day | ORAL | 2 refills | Status: DC

## 2019-09-04 MED ORDER — ASPIRIN 325 MG PO TABS: 325.0000 mg | ORAL_TABLET | Freq: Every day | ORAL | 0 refills | Status: AC

## 2019-09-04 MED ORDER — ATORVASTATIN CALCIUM 40 MG PO TABS: 40.0000 mg | ORAL_TABLET | Freq: Every day | ORAL | 0 refills | Status: DC

## 2019-09-04 NOTE — Progress Notes (Signed)
  Pt alert and oriented x4. When attempting to get orthostatic vitals on patient, he became dizzy when sitting up. He attempted to left his head off the bed and ended up falling back in bed. Pt was assisted up after several minutes slowly where he still mentioned he was dizzy. Pt was unable to stand more that 2 minutes without beginning to fall back.

## 2019-09-04 NOTE — Consult Note (Signed)
Physical Medicine and Rehabilitation Consult   Reason for Consult: Functional decline Referring Physician: Dr. Doristine Bosworth   HPI: Elijah [name] is a 85 y.o. male with history of HTN, arthritis, BPH; who was admitted on 09/03/2018 with sudden onset of dizziness and imbalance.  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  MRI brain showed moderate acute right cerebellum in right PICA territory and additional punctate acute infarct superiorly in right cerebellum.  2D echo showed EF of 55 to 60% with no wall abnormality and mild aortic sclerosis without stenosis. Dr. Erlinda Hong felt that strokes secondary to large vessel disease and recommends DAPT x3 months followed by aspirin alone.  Patient with resultant dizziness as well as left lean and balance deficits.   He was very dizzy and lightheaded yesterday therefore discharge held and CIR recommended. He reports that he is feeling better this am--has walked to the bathroom and in the room without recurrent symptoms. He feels that he can do better at home with home therapies--has son next door who can assist if needed.    Review of Systems  Constitutional: Negative for chills and fever.  HENT: Negative for hearing loss and tinnitus.   Eyes: Negative for blurred vision and double vision.  Respiratory: Negative for cough, sputum production and shortness of breath.   Cardiovascular: Negative for chest pain, palpitations and leg swelling.  Gastrointestinal: Negative for abdominal pain, heartburn and nausea.  Genitourinary: Negative for frequency and urgency.       Nocturia X 2  Musculoskeletal: Negative for myalgias and neck pain.  Skin: Negative for itching and rash.  Neurological: Negative for dizziness, speech change, focal weakness and headaches.   Psychiatric/Behavioral: The patient is not nervous/anxious and does not have insomnia.      Past Medical History:  Diagnosis Date  . Allergy   . Arthritis    hands  . Asthma   . Elevated PSA    s/p neg biopsy  . Enlarged prostate   . Hyperlipidemia   . Hypertension   . Wears hearing aid     Past Surgical History:  Procedure Laterality Date  . CATARACT EXTRACTION W/PHACO Right 05/08/2015   Procedure: CATARACT EXTRACTION PHACO AND INTRAOCULAR LENS PLACEMENT (IOC);  Surgeon: Leandrew Koyanagi, MD;  Location: [facility];  Service: Ophthalmology;  Laterality: Right;  . PROSTATE BIOPSY    . TONSILLECTOMY      Family History  Problem Relation Age of Onset  . Heart disease Father   . Stroke Mother   . Cancer Sister     Social History:  Married. Independent and active PTA--walks a couple of miles daily. Retired [employer]. He reports that he has never smoked. He has never used smokeless tobacco. He reports current alcohol use of about 14.0 standard drinks of alcohol (bourbon) per week. He reports that he does not use drugs.   Allergies  Allergen Reactions  . Penicillins Hives   Medications Prior to Admission  Medication Sig Dispense Refill  . albuterol (VENTOLIN HFA) 108 (90 Base) MCG/ACT inhaler Inhale 1-2 puffs into the lungs every 4 (four) hours as needed for wheezing or shortness of breath.    . cholecalciferol (VITAMIN D3) 25 MCG (1000 UNIT) tablet Take 2,000 Units by mouth daily.    . fluticasone (FLONASE) 50 MCG/ACT  nasal spray USE 2 SPRAYS IN EACH NOSTRIL DURING ALLERGY SEASON. 16 g 6  . pseudoephedrine (SUDAFED) 30 MG tablet Take 30 mg by mouth as needed.      . simvastatin (ZOCOR) 20 MG tablet TAKE 1 TABLET BY MOUTH EVERY DAY (Patient taking differently: Take 20 mg by mouth daily at 6 PM. Pt states he "chips some off of tablet" dose unknown.) 90 tablet 0  . [DISCONTINUED] amLODipine (NORVASC) 5 MG tablet Take 1 tablet (5 mg total) by mouth  daily. 90 tablet 2  . [DISCONTINUED] quinapril (ACCUPRIL) 10 MG tablet TAKE 1 TABLET BY MOUTH EVERY DAY (Patient taking differently: Take 10 mg by mouth daily. TAKE 1 TABLET BY MOUTH EVERY DAY) 90 tablet 2  . Calcium Carbonate-Vitamin D (TH CALCIUM CARBONATE-VITAMIN D) 600-400 MG-UNIT per tablet Take 1 tablet by mouth daily.        Home: Home Living Family/patient expects to be discharged to:: Private residence Living Arrangements: Spouse/significant other Available Help at Discharge: Family, Available 24 hours/day(wife vision is bad) Type of Home: Other(Comment)(condo) Home Access: Level entry Home Layout: Two level, Able to live on main level with bedroom/bathroom Bathroom Shower/Tub: Walk-in shower, Tub/shower unit(pt uses walk in shower) Biochemist, clinical: Standard Bathroom Accessibility: Yes Home Equipment: Grab bars - tub/shower, Environmental consultant - 2 wheels, Walker - 4 wheels, [place] - single point, Shower seat - built in, Therapist, art, Wheelchair - manual  Functional History: Prior Function Level of Independence: Independent Comments: drives; wife is unable to drive but family is able to assist Functional Status:  Mobility: Bed Mobility Overal bed mobility: Needs Assistance Bed Mobility: Supine to Sit, Sit to Supine Supine to sit: Supervision Sit to supine: Supervision General bed mobility comments: No assist needed to come to EOB Transfers Overall transfer level: Needs assistance Equipment used: Rolling walker (2 wheeled) Transfers: Sit to/from Stand, [employer] Transfers Sit to Stand: Min assist Stand pivot transfers: Min assist General transfer comment: L bias, RW helps but pt tends to lean left unless cued verbally and tactilly.  Ambulation/Gait Ambulation/Gait assistance: Min assist Gait Distance (Feet): 75 Feet Assistive device: Rolling walker (2 wheeled) Gait Pattern/deviations: Step-through pattern, Decreased stride length, Staggering left, Drifts  right/left General Gait Details: Pt was able to ambulate in hallway but needed compensatory strategies to decr balance issues as well as verbal and tactile cues.  Pt with left bias needing steadying assist at times.  Assist to steer RW as well.  Gait velocity interpretation: <1.8 ft/sec, indicate of risk for recurrent falls    ADL: ADL Overall ADL's : Needs assistance/impaired Eating/Feeding: Independent Grooming: Minimal assistance, Standing Upper Body Bathing: Set up, Sitting Lower Body Bathing: Minimal assistance, Sit to/from stand Upper Body Dressing : Set up, Sitting Lower Body Dressing: Minimal assistance, Sit to/from stand Toilet Transfer: Minimal assistance, Ambulation, RW Toileting- Clothing Manipulation and Hygiene: Minimal assistance Functional mobility during ADLs: Minimal assistance, Rolling walker General ADL Comments: Educated son on home safety and reducing risk of falls and need for direct hands on assistance with all mobility and ADL. Discussed need to use urinal at night instead of attempting to walk to the bathroom to reduce risk of falls. Son walked with his Dad in hall using gait belt with input underneath L axilla to improve midline positioning when walking - using gait belt as well.   Cognition: Cognition Overall Cognitive Status: Within Functional Limits for tasks assessed Orientation Level: Oriented X4 Cognition Arousal/Alertness: Awake/alert Behavior During Therapy: WFL for tasks assessed/performed Overall  Cognitive Status: Within Functional Limits for tasks assessed General Comments: decreased insight into deficits  Blood pressure (!) 147/97, pulse 92, temperature 98.2 F (36.8 C), temperature source Oral, resp. rate 16, height 5\' 7"  (1.702 m), weight 70.7 kg, SpO2 97 %.  Physical Exam  Nursing note and vitals reviewed. Constitutional: He is oriented to person, place, and time. He appears well-developed and well-nourished. HEENT: Head is normocephalic,  atraumatic, PERRLA, EOMI, sclera anicteric, oral mucosa pink and moist, dentition intact, ext ear canals clear,  Neck: Supple without JVD or lymphadenopathy Heart: Reg rate and rhythm. No murmurs rubs or gallops Chest: CTA bilaterally without wheezes, rales, or rhonchi; no distress Abdomen: Soft, non-tender, non-distended, bowel sounds positive. Extremities: No clubbing, cyanosis, or edema. Pulses are 2+ Skin: Clean and intact without signs of breakdown Neurological: He is alert and oriented to person, place, and time.  Speech clear. Follows one and two step commands without difficulty. Decrease in fine motor movements RLE and pronator drift RUE.  5/5 strength throughout. Unsteady gait, requires CG.   Psych: Pt's affect is appropriate. Pt is cooperative  Results for orders placed or performed during the hospital encounter of 09/03/19 (from the past 24 hour(s))  CBC     Status: Abnormal   Collection Time: 09/04/19  8:52 AM  Result Value Ref Range   WBC 10.2 4.0 - 10.5 K/uL   RBC 5.08 4.22 - 5.81 MIL/uL   Hemoglobin 16.5 13.0 - 17.0 g/dL   HCT 45.6 39.0 - 52.0 %   MCV 89.8 80.0 - 100.0 fL   MCH 32.5 26.0 - 34.0 pg   MCHC 36.2 (H) 30.0 - 36.0 g/dL   RDW 13.3 11.5 - 15.5 %   Platelets 210 150 - 400 K/uL   nRBC 0.0 0.0 - 0.2 %  Basic metabolic panel     Status: Abnormal   Collection Time: 09/04/19  8:52 AM  Result Value Ref Range   Sodium 140 135 - 145 mmol/L   Potassium 3.7 3.5 - 5.1 mmol/L   Chloride 108 98 - 111 mmol/L   CO2 22 22 - 32 mmol/L   Glucose, Bld 117 (H) 70 - 99 mg/dL   BUN 9 8 - 23 mg/dL   Creatinine, Ser 1.06 0.61 - 1.24 mg/dL   Calcium 9.4 8.9 - 10.3 mg/dL   GFR calc non Af Amer >60 >60 mL/min   GFR calc Af Amer >60 >60 mL/min   Anion gap 10 5 - 15   CT Angio Head W or Wo Contrast  Result Date: 09/03/2019 CLINICAL DATA:  Carotid artery stenosis. Near syncopal episode today. Dizziness. EXAM: CT ANGIOGRAPHY HEAD AND NECK TECHNIQUE: Multidetector CT imaging of the  head and neck was performed using the standard protocol during bolus administration of intravenous contrast. Multiplanar CT image reconstructions and MIPs were obtained to evaluate the vascular anatomy. Carotid stenosis measurements (when applicable) are obtained utilizing NASCET criteria, using the distal internal carotid diameter as the denominator. CONTRAST:  50mL OMNIPAQUE IOHEXOL 350 MG/ML SOLN COMPARISON:  Brain MRI performed earlier the same day 09/03/2019. FINDINGS: CT HEAD FINDINGS Brain: There is no evidence of acute intracranial hemorrhage, intracranial mass, midline shift or extra-axial fluid collection.No demarcated cortical infarction. A moderate-sized acute infarct within the right cerebellum in the right PICA vascular territory was better appreciated on same-day brain MRI. Redemonstrated chronic infarcts within the left cerebellum. Background mild generalized parenchymal atrophy and chronic small vessel ischemic disease. Vascular: Reported separately. Skull: Normal. Negative for fracture or focal  lesion. Sinuses: No significant paranasal sinus disease or mastoid effusion. Orbits: Visualized orbits demonstrate no acute abnormality. Review of the MIP images confirms the above findings CTA NECK FINDINGS Aortic arch: Standard aortic branching. Calcified plaque within the visualized aortic arch and proximal major branch vessels of the neck. No significant innominate or proximal subclavian artery stenosis. Right carotid system: CCA and ICA patent within the neck without significant stenosis (50% or greater). Mild mixed plaque within the proximal ICA. Left carotid system: CCA and ICA patent within the neck without significant stenosis (50% or greater). Minimal mixed plaque within the proximal ICA. Vertebral arteries: Predominantly soft plaque results in severe stenosis at the origin of the right vertebral artery. Portions of the proximal V2 right vertebral artery appears severely stenosed or occluded (for  instance as seen on series 11, image 237). More distally, the V2 and V3 right vertebral artery is patent. The V1 left vertebral artery appears occluded. There is reconstitution of flow within the V2 and left vertebral artery, although with a diminutive appearance of this vessel. Skeleton: No acute bony abnormality cervical spondylosis with multilevel disc height loss, posterior disc osteophytes, uncovertebral and facet hypertrophy. Other neck: No neck mass or cervical lymphadenopathy. Subcentimeter thyroid nodules not meeting consensus criteria for ultrasound follow-up. Upper chest: No consolidation within the imaged lung apices. Review of the MIP images confirms the above findings CTA HEAD FINDINGS Anterior circulation: The intracranial internal carotid arteries are patent with scattered calcified plaque. No significant stenosis in these vessels. The M1 middle cerebral arteries are patent without significant stenosis. No M2 proximal branch occlusion or high-grade proximal stenosis is identified. The anterior cerebral arteries are patent. Moderate focal stenosis within a proximal to mid A2 right anterior cerebral artery branch. No intracranial aneurysm is identified. Posterior circulation: The intracranial right vertebral artery is markedly diminutive beyond the origin of the right PICA but appears patent. There is prominent atherosclerotic irregularity of the intracranial left vertebral artery with sites of severe stenosis. There is a paucity of flow within the right PICA shortly beyond its origin. The basilar artery is diminutive with multifocal atherosclerotic irregularity and sites of moderate to moderately severe stenosis. There are sizable bilateral posterior communicating arteries. The P1 left posterior cerebral artery is highly stenotic or developmentally diminutive. Otherwise, no significant proximal stenosis within these vessels. Venous sinuses: Within limitations of contrast timing, no convincing  thrombus. Anatomic variants: As described. Review of the MIP images confirms the above findings These results were called by telephone at the time of interpretation on 09/03/2019 at 5:57 pm to provider DAVID YAO , who verbally acknowledged these results. IMPRESSION: CT head: 1. A moderate-sized acute right PICA vascular territory cerebellar infarct was better appreciated on same-day brain MRI. 2. Redemonstrated chronic infarcts within the left cerebellum. 3. Background mild generalized parenchymal atrophy and chronic small vessel ischemic disease. CTA neck: 1. The bilateral common and internal carotid arteries are patent within the neck without significant stenosis. Mild atherosclerotic disease within the proximal ICAs. 2. Severe stenosis at the origin of the right vertebral artery. Portions of the proximal V2 right vertebral artery appear highly stenosed or occluded. There is reconstitution of flow within the more distal V2 and V3 segments. 3. The V1 left vertebral artery is occluded. There is reconstitution of flow within the V2 and V3 segments, although with an irregular and diminutive appearance of this vessel. CTA head: 1. The intracranial right vertebral artery is markedly diminutive beyond the origin of the right PICA, although patent. There  is a paucity of flow within the right PICA shortly beyond its origin and this vessel is likely occluded given findings on same-day brain MRI. 2. Prominent atherosclerotic irregularity of the intracranial left vertebral artery with sites of severe stenosis. 3. The basilar artery is diminutive with atherosclerotic irregularity and sites of up to moderate/moderately severe stenosis. 4. Sizable bilateral posterior communicating arteries. he P1 left PCA is either developmentally diminutive or severely stenotic. Electronically Signed   By: Kellie Simmering DO   On: 09/03/2019 17:55   CT Angio Neck W and/or Wo Contrast  Result Date: 09/03/2019 CLINICAL DATA:  Carotid artery  stenosis. Near syncopal episode today. Dizziness. EXAM: CT ANGIOGRAPHY HEAD AND NECK TECHNIQUE: Multidetector CT imaging of the head and neck was performed using the standard protocol during bolus administration of intravenous contrast. Multiplanar CT image reconstructions and MIPs were obtained to evaluate the vascular anatomy. Carotid stenosis measurements (when applicable) are obtained utilizing NASCET criteria, using the distal internal carotid diameter as the denominator. CONTRAST:  39mL OMNIPAQUE IOHEXOL 350 MG/ML SOLN COMPARISON:  Brain MRI performed earlier the same day 09/03/2019. FINDINGS: CT HEAD FINDINGS Brain: There is no evidence of acute intracranial hemorrhage, intracranial mass, midline shift or extra-axial fluid collection.No demarcated cortical infarction. A moderate-sized acute infarct within the right cerebellum in the right PICA vascular territory was better appreciated on same-day brain MRI. Redemonstrated chronic infarcts within the left cerebellum. Background mild generalized parenchymal atrophy and chronic small vessel ischemic disease. Vascular: Reported separately. Skull: Normal. Negative for fracture or focal lesion. Sinuses: No significant paranasal sinus disease or mastoid effusion. Orbits: Visualized orbits demonstrate no acute abnormality. Review of the MIP images confirms the above findings CTA NECK FINDINGS Aortic arch: Standard aortic branching. Calcified plaque within the visualized aortic arch and proximal major branch vessels of the neck. No significant innominate or proximal subclavian artery stenosis. Right carotid system: CCA and ICA patent within the neck without significant stenosis (50% or greater). Mild mixed plaque within the proximal ICA. Left carotid system: CCA and ICA patent within the neck without significant stenosis (50% or greater). Minimal mixed plaque within the proximal ICA. Vertebral arteries: Predominantly soft plaque results in severe stenosis at the  origin of the right vertebral artery. Portions of the proximal V2 right vertebral artery appears severely stenosed or occluded (for instance as seen on series 11, image 237). More distally, the V2 and V3 right vertebral artery is patent. The V1 left vertebral artery appears occluded. There is reconstitution of flow within the V2 and left vertebral artery, although with a diminutive appearance of this vessel. Skeleton: No acute bony abnormality cervical spondylosis with multilevel disc height loss, posterior disc osteophytes, uncovertebral and facet hypertrophy. Other neck: No neck mass or cervical lymphadenopathy. Subcentimeter thyroid nodules not meeting consensus criteria for ultrasound follow-up. Upper chest: No consolidation within the imaged lung apices. Review of the MIP images confirms the above findings CTA HEAD FINDINGS Anterior circulation: The intracranial internal carotid arteries are patent with scattered calcified plaque. No significant stenosis in these vessels. The M1 middle cerebral arteries are patent without significant stenosis. No M2 proximal branch occlusion or high-grade proximal stenosis is identified. The anterior cerebral arteries are patent. Moderate focal stenosis within a proximal to mid A2 right anterior cerebral artery branch. No intracranial aneurysm is identified. Posterior circulation: The intracranial right vertebral artery is markedly diminutive beyond the origin of the right PICA but appears patent. There is prominent atherosclerotic irregularity of the intracranial left vertebral artery with sites  of severe stenosis. There is a paucity of flow within the right PICA shortly beyond its origin. The basilar artery is diminutive with multifocal atherosclerotic irregularity and sites of moderate to moderately severe stenosis. There are sizable bilateral posterior communicating arteries. The P1 left posterior cerebral artery is highly stenotic or developmentally diminutive. Otherwise,  no significant proximal stenosis within these vessels. Venous sinuses: Within limitations of contrast timing, no convincing thrombus. Anatomic variants: As described. Review of the MIP images confirms the above findings These results were called by telephone at the time of interpretation on 09/03/2019 at 5:57 pm to provider DAVID YAO , who verbally acknowledged these results. IMPRESSION: CT head: 1. A moderate-sized acute right PICA vascular territory cerebellar infarct was better appreciated on same-day brain MRI. 2. Redemonstrated chronic infarcts within the left cerebellum. 3. Background mild generalized parenchymal atrophy and chronic small vessel ischemic disease. CTA neck: 1. The bilateral common and internal carotid arteries are patent within the neck without significant stenosis. Mild atherosclerotic disease within the proximal ICAs. 2. Severe stenosis at the origin of the right vertebral artery. Portions of the proximal V2 right vertebral artery appear highly stenosed or occluded. There is reconstitution of flow within the more distal V2 and V3 segments. 3. The V1 left vertebral artery is occluded. There is reconstitution of flow within the V2 and V3 segments, although with an irregular and diminutive appearance of this vessel. CTA head: 1. The intracranial right vertebral artery is markedly diminutive beyond the origin of the right PICA, although patent. There is a paucity of flow within the right PICA shortly beyond its origin and this vessel is likely occluded given findings on same-day brain MRI. 2. Prominent atherosclerotic irregularity of the intracranial left vertebral artery with sites of severe stenosis. 3. The basilar artery is diminutive with atherosclerotic irregularity and sites of up to moderate/moderately severe stenosis. 4. Sizable bilateral posterior communicating arteries. he P1 left PCA is either developmentally diminutive or severely stenotic. Electronically Signed   By: Kellie Simmering DO    On: 09/03/2019 17:55   MR BRAIN WO CONTRAST  Addendum Date: 09/03/2019   ADDENDUM REPORT: 09/03/2019 17:18 ADDENDUM: These results were called by telephone at the time of interpretation on 09/03/2019 at 5:18 pm to provider DAVID YAO , who verbally acknowledged these results. Electronically Signed   By: Kellie Simmering DO   On: 09/03/2019 17:18   Result Date: 09/03/2019 CLINICAL DATA:  Dizziness, rule out stroke; headache, intracranial hemorrhage suspected. EXAM: MRI HEAD WITHOUT CONTRAST TECHNIQUE: Multiplanar, multiecho pulse sequences of the brain and surrounding structures were obtained without intravenous contrast. COMPARISON:  No pertinent prior studies available for comparison. FINDINGS: Brain: There is a moderate-sized focus of restricted diffusion within the right cerebellum in the right PICA vascular territory consistent with acute infarction. There is an additional punctate focus of acute infarction within the superior right cerebellum (series 5, image 60). No evidence of intracranial mass. No midline shift or extra-axial fluid collection. No chronic intracranial blood products. Chronic left cerebellar infarcts. Minimal scattered T2/FLAIR hyperintensity within the cerebral white matter is nonspecific, but consistent with chronic small vessel ischemic disease. Moderate generalized parenchymal atrophy. Vascular: Abnormal appearance of the intracranial left vertebral artery which may reflect high-grade stenosis or occlusion. Flow voids otherwise maintained within the proximal large arterial vessels. Skull and upper cervical spine: No focal marrow lesion. Incompletely assessed upper cervical spondylosis. Sinuses/Orbits: Prior right lens replacement. No significant paranasal sinus disease or mastoid effusion IMPRESSION: Moderate-sized acute infarct within the  right cerebellum in the right PICA vascular territory. Additional punctate acute infarct more superiorly within the right cerebellum. Chronic left  cerebellar infarcts. Background moderate generalized parenchymal atrophy with mild chronic small vessel ischemic disease. Abnormal appearance of the intracranial left vertebral artery which may reflect high-grade stenosis or occlusion. Electronically Signed: By: Kellie Simmering DO On: 09/03/2019 17:11   ECHOCARDIOGRAM COMPLETE  Result Date: 09/04/2019    ECHOCARDIOGRAM REPORT   Patient Name:   Elijah [name] Date of Exam: 09/04/2019 Medical Rec #:  [id number]      Height:       68.0 in Accession #:    [id number]     Weight:       166.0 lb Date of Birth:  1935/04/25       BSA:          1.888 m Patient Age:    18 years       BP:           139/82 mmHg Patient Gender: M              HR:           74 bpm. Exam Location:  Inpatient Procedure: 2D Echo, Color Doppler and Cardiac Doppler Indications:    Stroke i163.9  History:        Patient has no prior history of Echocardiogram examinations.                 Risk Factors:Hypertension and Dyslipidemia.  Sonographer:    Raquel Sarna Senior RDCS Referring Phys: [id number] [place]  1. Normal LV systolic function; grade 1 diastolic dysfunction.  2. Left ventricular ejection fraction, by estimation, is 55 to 60%. The left ventricle has normal function. The left ventricle has no regional wall motion abnormalities. Left ventricular diastolic parameters are consistent with Grade I diastolic dysfunction (impaired relaxation).  3. Right ventricular systolic function is normal. The right ventricular size is normal.  4. The mitral valve is normal in structure. Trivial mitral valve regurgitation. No evidence of mitral stenosis.  5. The aortic valve is tricuspid. Aortic valve regurgitation is not visualized. Mild aortic valve sclerosis is present, with no evidence of aortic valve stenosis.  6. The inferior vena cava is normal in size with greater than 50% respiratory variability, suggesting right atrial pressure of 3 mmHg. FINDINGS  Left Ventricle: Left ventricular ejection  fraction, by estimation, is 55 to 60%. The left ventricle has normal function. The left ventricle has no regional wall motion abnormalities. The left ventricular internal cavity size was normal in size. There is  no left ventricular hypertrophy. Left ventricular diastolic parameters are consistent with Grade I diastolic dysfunction (impaired relaxation). Right Ventricle: The right ventricular size is normal. Right ventricular systolic function is normal. Left Atrium: Left atrial size was normal in size. Right Atrium: Right atrial size was normal in size. Pericardium: There is no evidence of pericardial effusion. Mitral Valve: The mitral valve is normal in structure. Normal mobility of the mitral valve leaflets. Trivial mitral valve regurgitation. No evidence of mitral valve stenosis. Tricuspid Valve: The tricuspid valve is normal in structure. Tricuspid valve regurgitation is trivial. No evidence of tricuspid stenosis. Aortic Valve: The aortic valve is tricuspid. Aortic valve regurgitation is not visualized. Mild aortic valve sclerosis is present, with no evidence of aortic valve stenosis. Pulmonic Valve: The pulmonic valve was not well visualized. Pulmonic valve regurgitation is not visualized. No evidence of pulmonic stenosis. Aorta: The aortic root is normal in  size and structure. Venous: The inferior vena cava is normal in size with greater than 50% respiratory variability, suggesting right atrial pressure of 3 mmHg. IAS/Shunts: No atrial level shunt detected by color flow Doppler. Additional Comments: Normal LV systolic function; grade 1 diastolic dysfunction.  LEFT VENTRICLE PLAX 2D LVIDd:         4.40 cm      Diastology LVIDs:         3.40 cm      LV e' lateral:   7.07 cm/s LV PW:         0.90 cm      LV E/e' lateral: 7.0 LV IVS:        0.90 cm      LV e' medial:    6.20 cm/s LVOT diam:     2.20 cm      LV E/e' medial:  8.0 LV SV:         77 LV SV Index:   41 LVOT Area:     3.80 cm  LV Volumes (MOD) LV vol  d, MOD A4C: 121.0 ml LV vol s, MOD A4C: 55.0 ml LV SV MOD A4C:     121.0 ml RIGHT VENTRICLE RV S prime:     16.60 cm/s TAPSE (M-mode): 2.3 cm LEFT ATRIUM             Index       RIGHT ATRIUM           Index LA diam:        3.40 cm 1.80 cm/m  RA Area:     18.60 cm LA Vol (A2C):   59.4 ml 31.46 ml/m RA Volume:   51.20 ml  27.12 ml/m LA Vol (A4C):   24.6 ml 13.03 ml/m LA Biplane Vol: 40.0 ml 21.18 ml/m  AORTIC VALVE LVOT Vmax:   108.00 cm/s LVOT Vmean:  75.500 cm/s LVOT VTI:    0.202 m  AORTA Ao Root diam: 3.20 cm Ao Asc diam:  3.70 cm MITRAL VALVE MV Area (PHT): 3.37 cm    SHUNTS MV Decel Time: 225 msec    Systemic VTI:  0.20 m MV E velocity: 49.30 cm/s  Systemic Diam: 2.20 cm MV A velocity: 93.00 cm/s MV E/A ratio:  0.53 Kirk Ruths MD Electronically signed by Kirk Ruths MD Signature Date/Time: 09/04/2019/11:36:38 AM    Final    Assessment/Plan: Elijah [name] is a 85 y.o. male with history of HTN, arthritis, BPH; who was admitted on 09/03/2018 with sudden onset of dizziness and imbalance.  --He has an unsteady gait and would benefit from a short stay in [facility]. This was discussed with patient today, but he strongly prefers to be discharged home with home therapies. He lives with his wife and his son lives next door, so he will have good social support when he goes home. Thank you for this consult.     Bary Leriche, PA-C 09/05/2019   I have personally performed a face to face diagnostic evaluation, including, but not limited to relevant history and physical exam findings, of this patient and developed relevant assessment and plan.  Additionally, I have reviewed and concur with the physician assistant's documentation above.  Leeroy Cha, MD

## 2019-09-04 NOTE — Discharge Summary (Signed)
Physician Discharge Summary  Elijah [name] [id number] DOB: Jul 31, 1934 DOA: 09/03/2019  PCP: Lucille Passy, MD  Admit date: 09/03/2019 Discharge date: 09/04/2019  Admitted From: Home Disposition: Home  Recommendations for Outpatient Follow-up:  1. Follow up with PCP in 1-2 weeks 2. Follow with neurology in 4 weeks 3. Please hold your antihypertensives for 2 days and resume both amlodipine and Accupril on 09/06/2019 to allow permissive hypertension for next 2 days. 4. Please obtain BMP/CBC in one week 5. Please follow up on the following pending results:  Home Health: Yes Equipment/Devices: Walker and commode, patient already has them  Discharge Condition: Stable CODE STATUS: Full code Diet recommendation: Cardiac  Subjective: Seen and examined.  No complaints other than mild dizziness.  HPI: Elijah [name] is a 85 y.o. male with medical history significant of hypertension, hyperlipidemia, asthma, arthritis presents to emergency department due to dizziness, diaphoresis.  Patient tells me that he just got home from church this afternoon and was trying to undress himself and suddenly he became dizzy, he described as room is spinning around him, unable to open his eyes and had to lay down on the floor.  Reports nausea and headache as well.   No history of head trauma, seizures, loss of consciousness, urinary or bowel incontinence, previous history of stroke, blurry vision, chest pain, shortness of breath, palpitation, leg swelling, fever, chills, cough, congestion, dysuria, diarrhea, vomiting, decreased appetite, generalized weakness or lethargy.  He lives with his wife, no history of smoking, illicit drug use however drinks whiskey every day.  He is fairly active and exercise every day.  ED Course: Upon arrival: Patient's blood pressure was noted to be on higher side.  Initial labs such as UA came back negative, CBC: WNL, CMP shows potassium of 3.3.  MRI, CT angiogram of head and neck  concerning for right cerebellar infarction.  EDP consulted neurology for further evaluation and management.  [facility] hospitalist consulted for the admission.  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  Patient was started on aspirin and Plavix.  Neurology was consulted.  He was seen by PT OT and they recommended home health, walker and bedside commode and 24-hour supervision.  He already has walker and commode.  Home health was ordered for patient.  Patient was cleared by neurology today with instructions to continue aspirin 325 mg along with Plavix 75 mg p.o. daily for 3 months followed by aspirin 325 mg daily.  No further intervention was recommended.  I personally spoke to patient's son Gerilyn Nestle Lamping who is available to provide 24-hour care and is agreeable with discharge plan.  He will follow with PCP within 1 week and neurology in 4 weeks.  His simvastatin is being switched to atorvastatin.  His antihypertensives are being held for next 2 days to allow permissive hypertension as despite of holding his antihypertensives here, his blood pressure is in upper normal range.  Discharge Diagnoses:  Principal Problem:   CVA (cerebral vascular accident) ([place]) Active Problems:   HLD (hyperlipidemia)   Essential hypertension   Mild intermittent asthma    Discharge Instructions  Discharge Instructions    Ambulatory referral to Neurology   Complete by: As directed    Follow up with stroke clinic NP (Jessica Vanschaick or Cecille Rubin, if both not available, consider Zachery Dauer, or [facility]) at [facility] in about 4 weeks. Thanks.   Discharge patient   Complete by:  As directed    Discharge disposition: 06-Home-Health Care Svc   Discharge patient date: 09/04/2019     Allergies as of 09/04/2019      Reactions   Penicillins Hives      Medication List    STOP taking these  medications   simvastatin 20 MG tablet Commonly known as: ZOCOR     TAKE these medications   albuterol 108 (90 Base) MCG/ACT inhaler Commonly known as: VENTOLIN HFA Inhale 1-2 puffs into the lungs every 4 (four) hours as needed for wheezing or shortness of breath.   amLODipine 5 MG tablet Commonly known as: NORVASC Take 1 tablet (5 mg total) by mouth daily. Start taking on: September 06, 2019 What changed: These instructions start on September 06, 2019. If you are unsure what to do until then, ask your doctor or other care provider.   aspirin 325 MG tablet Take 1 tablet (325 mg total) by mouth daily. Start taking on: September 05, 2019   atorvastatin 40 MG tablet Commonly known as: LIPITOR Take 1 tablet (40 mg total) by mouth daily at 6 PM.   cholecalciferol 25 MCG (1000 UNIT) tablet Commonly known as: VITAMIN D3 Take 2,000 Units by mouth daily.   clopidogrel 75 MG tablet Commonly known as: PLAVIX Take 1 tablet (75 mg total) by mouth daily. Start taking on: September 05, 2019   fluticasone 50 MCG/ACT nasal spray Commonly known as: FLONASE USE 2 SPRAYS IN EACH NOSTRIL DURING ALLERGY SEASON.   pseudoephedrine 30 MG tablet Commonly known as: SUDAFED Take 30 mg by mouth as needed.   quinapril 10 MG tablet Commonly known as: ACCUPRIL Take 1 tablet (10 mg total) by mouth daily. TAKE 1 TABLET BY MOUTH EVERY DAY Start taking on: September 06, 2019 What changed:   additional instructions  These instructions start on September 06, 2019. If you are unsure what to do until then, ask your doctor or other care provider.   TH Calcium Carbonate-Vitamin D 600-400 MG-UNIT tablet Generic drug: Calcium Carbonate-Vitamin D Take 1 tablet by mouth daily.      Follow-up Information    [facility]. Schedule an appointment as soon as possible for a visit in 4 week(s).   Specialty: Neurology Contact information: [address] [place] [phone]        Lucille Passy, MD Follow up in 1 week(s).   Specialty: Family Medicine         Allergies  Allergen Reactions  . Penicillins Hives    Consultations: Neurology   Procedures/Studies: CT Angio Head W or Wo Contrast  Result Date: 09/03/2019 CLINICAL DATA:  Carotid artery stenosis. Near syncopal episode today. Dizziness. EXAM: CT ANGIOGRAPHY HEAD AND NECK TECHNIQUE: Multidetector CT imaging of the head and neck was performed using the standard protocol during bolus administration of intravenous contrast. Multiplanar CT image reconstructions and MIPs were obtained to evaluate the vascular anatomy. Carotid stenosis measurements (when applicable) are obtained utilizing NASCET criteria, using the distal internal carotid diameter as the denominator. CONTRAST:  74mL OMNIPAQUE IOHEXOL 350 MG/ML SOLN COMPARISON:  Brain MRI performed earlier the same day 09/03/2019. FINDINGS: CT HEAD FINDINGS Brain: There is no evidence of acute intracranial hemorrhage, intracranial mass, midline shift or extra-axial fluid collection.No demarcated cortical infarction. A moderate-sized acute infarct within the right cerebellum in the right PICA vascular territory was better appreciated on same-day brain MRI. Redemonstrated chronic infarcts within the left cerebellum. Background mild generalized parenchymal atrophy and chronic small vessel  ischemic disease. Vascular: Reported separately. Skull: Normal. Negative for fracture or focal lesion. Sinuses: No significant paranasal sinus disease or mastoid effusion. Orbits: Visualized orbits demonstrate no acute abnormality. Review of the MIP images confirms the above findings CTA NECK FINDINGS Aortic arch: Standard aortic branching. Calcified plaque within the visualized aortic arch and proximal major branch vessels of the neck. No significant innominate or proximal subclavian artery stenosis. Right carotid system: CCA and ICA patent within the neck without significant stenosis (50%  or greater). Mild mixed plaque within the proximal ICA. Left carotid system: CCA and ICA patent within the neck without significant stenosis (50% or greater). Minimal mixed plaque within the proximal ICA. Vertebral arteries: Predominantly soft plaque results in severe stenosis at the origin of the right vertebral artery. Portions of the proximal V2 right vertebral artery appears severely stenosed or occluded (for instance as seen on series 11, image 237). More distally, the V2 and V3 right vertebral artery is patent. The V1 left vertebral artery appears occluded. There is reconstitution of flow within the V2 and left vertebral artery, although with a diminutive appearance of this vessel. Skeleton: No acute bony abnormality cervical spondylosis with multilevel disc height loss, posterior disc osteophytes, uncovertebral and facet hypertrophy. Other neck: No neck mass or cervical lymphadenopathy. Subcentimeter thyroid nodules not meeting consensus criteria for ultrasound follow-up. Upper chest: No consolidation within the imaged lung apices. Review of the MIP images confirms the above findings CTA HEAD FINDINGS Anterior circulation: The intracranial internal carotid arteries are patent with scattered calcified plaque. No significant stenosis in these vessels. The M1 middle cerebral arteries are patent without significant stenosis. No M2 proximal branch occlusion or high-grade proximal stenosis is identified. The anterior cerebral arteries are patent. Moderate focal stenosis within a proximal to mid A2 right anterior cerebral artery branch. No intracranial aneurysm is identified. Posterior circulation: The intracranial right vertebral artery is markedly diminutive beyond the origin of the right PICA but appears patent. There is prominent atherosclerotic irregularity of the intracranial left vertebral artery with sites of severe stenosis. There is a paucity of flow within the right PICA shortly beyond its origin. The  basilar artery is diminutive with multifocal atherosclerotic irregularity and sites of moderate to moderately severe stenosis. There are sizable bilateral posterior communicating arteries. The P1 left posterior cerebral artery is highly stenotic or developmentally diminutive. Otherwise, no significant proximal stenosis within these vessels. Venous sinuses: Within limitations of contrast timing, no convincing thrombus. Anatomic variants: As described. Review of the MIP images confirms the above findings These results were called by telephone at the time of interpretation on 09/03/2019 at 5:57 pm to provider DAVID YAO , who verbally acknowledged these results. IMPRESSION: CT head: 1. A moderate-sized acute right PICA vascular territory cerebellar infarct was better appreciated on same-day brain MRI. 2. Redemonstrated chronic infarcts within the left cerebellum. 3. Background mild generalized parenchymal atrophy and chronic small vessel ischemic disease. CTA neck: 1. The bilateral common and internal carotid arteries are patent within the neck without significant stenosis. Mild atherosclerotic disease within the proximal ICAs. 2. Severe stenosis at the origin of the right vertebral artery. Portions of the proximal V2 right vertebral artery appear highly stenosed or occluded. There is reconstitution of flow within the more distal V2 and V3 segments. 3. The V1 left vertebral artery is occluded. There is reconstitution of flow within the V2 and V3 segments, although with an irregular and diminutive appearance of this vessel. CTA head: 1. The intracranial right vertebral artery is  markedly diminutive beyond the origin of the right PICA, although patent. There is a paucity of flow within the right PICA shortly beyond its origin and this vessel is likely occluded given findings on same-day brain MRI. 2. Prominent atherosclerotic irregularity of the intracranial left vertebral artery with sites of severe stenosis. 3. The  basilar artery is diminutive with atherosclerotic irregularity and sites of up to moderate/moderately severe stenosis. 4. Sizable bilateral posterior communicating arteries. he P1 left PCA is either developmentally diminutive or severely stenotic. Electronically Signed   By: Kellie Simmering DO   On: 09/03/2019 17:55   CT Angio Neck W and/or Wo Contrast  Result Date: 09/03/2019 CLINICAL DATA:  Carotid artery stenosis. Near syncopal episode today. Dizziness. EXAM: CT ANGIOGRAPHY HEAD AND NECK TECHNIQUE: Multidetector CT imaging of the head and neck was performed using the standard protocol during bolus administration of intravenous contrast. Multiplanar CT image reconstructions and MIPs were obtained to evaluate the vascular anatomy. Carotid stenosis measurements (when applicable) are obtained utilizing NASCET criteria, using the distal internal carotid diameter as the denominator. CONTRAST:  91mL OMNIPAQUE IOHEXOL 350 MG/ML SOLN COMPARISON:  Brain MRI performed earlier the same day 09/03/2019. FINDINGS: CT HEAD FINDINGS Brain: There is no evidence of acute intracranial hemorrhage, intracranial mass, midline shift or extra-axial fluid collection.No demarcated cortical infarction. A moderate-sized acute infarct within the right cerebellum in the right PICA vascular territory was better appreciated on same-day brain MRI. Redemonstrated chronic infarcts within the left cerebellum. Background mild generalized parenchymal atrophy and chronic small vessel ischemic disease. Vascular: Reported separately. Skull: Normal. Negative for fracture or focal lesion. Sinuses: No significant paranasal sinus disease or mastoid effusion. Orbits: Visualized orbits demonstrate no acute abnormality. Review of the MIP images confirms the above findings CTA NECK FINDINGS Aortic arch: Standard aortic branching. Calcified plaque within the visualized aortic arch and proximal major branch vessels of the neck. No significant innominate or  proximal subclavian artery stenosis. Right carotid system: CCA and ICA patent within the neck without significant stenosis (50% or greater). Mild mixed plaque within the proximal ICA. Left carotid system: CCA and ICA patent within the neck without significant stenosis (50% or greater). Minimal mixed plaque within the proximal ICA. Vertebral arteries: Predominantly soft plaque results in severe stenosis at the origin of the right vertebral artery. Portions of the proximal V2 right vertebral artery appears severely stenosed or occluded (for instance as seen on series 11, image 237). More distally, the V2 and V3 right vertebral artery is patent. The V1 left vertebral artery appears occluded. There is reconstitution of flow within the V2 and left vertebral artery, although with a diminutive appearance of this vessel. Skeleton: No acute bony abnormality cervical spondylosis with multilevel disc height loss, posterior disc osteophytes, uncovertebral and facet hypertrophy. Other neck: No neck mass or cervical lymphadenopathy. Subcentimeter thyroid nodules not meeting consensus criteria for ultrasound follow-up. Upper chest: No consolidation within the imaged lung apices. Review of the MIP images confirms the above findings CTA HEAD FINDINGS Anterior circulation: The intracranial internal carotid arteries are patent with scattered calcified plaque. No significant stenosis in these vessels. The M1 middle cerebral arteries are patent without significant stenosis. No M2 proximal branch occlusion or high-grade proximal stenosis is identified. The anterior cerebral arteries are patent. Moderate focal stenosis within a proximal to mid A2 right anterior cerebral artery branch. No intracranial aneurysm is identified. Posterior circulation: The intracranial right vertebral artery is markedly diminutive beyond the origin of the right PICA but appears patent. There  is prominent atherosclerotic irregularity of the intracranial left  vertebral artery with sites of severe stenosis. There is a paucity of flow within the right PICA shortly beyond its origin. The basilar artery is diminutive with multifocal atherosclerotic irregularity and sites of moderate to moderately severe stenosis. There are sizable bilateral posterior communicating arteries. The P1 left posterior cerebral artery is highly stenotic or developmentally diminutive. Otherwise, no significant proximal stenosis within these vessels. Venous sinuses: Within limitations of contrast timing, no convincing thrombus. Anatomic variants: As described. Review of the MIP images confirms the above findings These results were called by telephone at the time of interpretation on 09/03/2019 at 5:57 pm to provider DAVID YAO , who verbally acknowledged these results. IMPRESSION: CT head: 1. A moderate-sized acute right PICA vascular territory cerebellar infarct was better appreciated on same-day brain MRI. 2. Redemonstrated chronic infarcts within the left cerebellum. 3. Background mild generalized parenchymal atrophy and chronic small vessel ischemic disease. CTA neck: 1. The bilateral common and internal carotid arteries are patent within the neck without significant stenosis. Mild atherosclerotic disease within the proximal ICAs. 2. Severe stenosis at the origin of the right vertebral artery. Portions of the proximal V2 right vertebral artery appear highly stenosed or occluded. There is reconstitution of flow within the more distal V2 and V3 segments. 3. The V1 left vertebral artery is occluded. There is reconstitution of flow within the V2 and V3 segments, although with an irregular and diminutive appearance of this vessel. CTA head: 1. The intracranial right vertebral artery is markedly diminutive beyond the origin of the right PICA, although patent. There is a paucity of flow within the right PICA shortly beyond its origin and this vessel is likely occluded given findings on same-day brain MRI.  2. Prominent atherosclerotic irregularity of the intracranial left vertebral artery with sites of severe stenosis. 3. The basilar artery is diminutive with atherosclerotic irregularity and sites of up to moderate/moderately severe stenosis. 4. Sizable bilateral posterior communicating arteries. he P1 left PCA is either developmentally diminutive or severely stenotic. Electronically Signed   By: Kellie Simmering DO   On: 09/03/2019 17:55   MR BRAIN WO CONTRAST  Addendum Date: 09/03/2019   ADDENDUM REPORT: 09/03/2019 17:18 ADDENDUM: These results were called by telephone at the time of interpretation on 09/03/2019 at 5:18 pm to provider DAVID YAO , who verbally acknowledged these results. Electronically Signed   By: Kellie Simmering DO   On: 09/03/2019 17:18   Result Date: 09/03/2019 CLINICAL DATA:  Dizziness, rule out stroke; headache, intracranial hemorrhage suspected. EXAM: MRI HEAD WITHOUT CONTRAST TECHNIQUE: Multiplanar, multiecho pulse sequences of the brain and surrounding structures were obtained without intravenous contrast. COMPARISON:  No pertinent prior studies available for comparison. FINDINGS: Brain: There is a moderate-sized focus of restricted diffusion within the right cerebellum in the right PICA vascular territory consistent with acute infarction. There is an additional punctate focus of acute infarction within the superior right cerebellum (series 5, image 60). No evidence of intracranial mass. No midline shift or extra-axial fluid collection. No chronic intracranial blood products. Chronic left cerebellar infarcts. Minimal scattered T2/FLAIR hyperintensity within the cerebral white matter is nonspecific, but consistent with chronic small vessel ischemic disease. Moderate generalized parenchymal atrophy. Vascular: Abnormal appearance of the intracranial left vertebral artery which may reflect high-grade stenosis or occlusion. Flow voids otherwise maintained within the proximal large arterial vessels.  Skull and upper cervical spine: No focal marrow lesion. Incompletely assessed upper cervical spondylosis. Sinuses/Orbits: Prior right lens replacement. No  significant paranasal sinus disease or mastoid effusion IMPRESSION: Moderate-sized acute infarct within the right cerebellum in the right PICA vascular territory. Additional punctate acute infarct more superiorly within the right cerebellum. Chronic left cerebellar infarcts. Background moderate generalized parenchymal atrophy with mild chronic small vessel ischemic disease. Abnormal appearance of the intracranial left vertebral artery which may reflect high-grade stenosis or occlusion. Electronically Signed: By: Kellie Simmering DO On: 09/03/2019 17:11   ECHOCARDIOGRAM COMPLETE  Result Date: 09/04/2019    ECHOCARDIOGRAM REPORT   Patient Name:   Elijah [name] Date of Exam: 09/04/2019 Medical Rec #:  [id number]      Height:       68.0 in Accession #:    [id number]     Weight:       166.0 lb Date of Birth:  01-Apr-1935       BSA:          1.888 m Patient Age:    41 years       BP:           139/82 mmHg Patient Gender: M              HR:           74 bpm. Exam Location:  Inpatient Procedure: 2D Echo, Color Doppler and Cardiac Doppler Indications:    Stroke i163.9  History:        Patient has no prior history of Echocardiogram examinations.                 Risk Factors:Hypertension and Dyslipidemia.  Sonographer:    Raquel Sarna Senior RDCS Referring Phys: [id number] [place]  1. Normal LV systolic function; grade 1 diastolic dysfunction.  2. Left ventricular ejection fraction, by estimation, is 55 to 60%. The left ventricle has normal function. The left ventricle has no regional wall motion abnormalities. Left ventricular diastolic parameters are consistent with Grade I diastolic dysfunction (impaired relaxation).  3. Right ventricular systolic function is normal. The right ventricular size is normal.  4. The mitral valve is normal in structure. Trivial mitral  valve regurgitation. No evidence of mitral stenosis.  5. The aortic valve is tricuspid. Aortic valve regurgitation is not visualized. Mild aortic valve sclerosis is present, with no evidence of aortic valve stenosis.  6. The inferior vena cava is normal in size with greater than 50% respiratory variability, suggesting right atrial pressure of 3 mmHg. FINDINGS  Left Ventricle: Left ventricular ejection fraction, by estimation, is 55 to 60%. The left ventricle has normal function. The left ventricle has no regional wall motion abnormalities. The left ventricular internal cavity size was normal in size. There is  no left ventricular hypertrophy. Left ventricular diastolic parameters are consistent with Grade I diastolic dysfunction (impaired relaxation). Right Ventricle: The right ventricular size is normal. Right ventricular systolic function is normal. Left Atrium: Left atrial size was normal in size. Right Atrium: Right atrial size was normal in size. Pericardium: There is no evidence of pericardial effusion. Mitral Valve: The mitral valve is normal in structure. Normal mobility of the mitral valve leaflets. Trivial mitral valve regurgitation. No evidence of mitral valve stenosis. Tricuspid Valve: The tricuspid valve is normal in structure. Tricuspid valve regurgitation is trivial. No evidence of tricuspid stenosis. Aortic Valve: The aortic valve is tricuspid. Aortic valve regurgitation is not visualized. Mild aortic valve sclerosis is present, with no evidence of aortic valve stenosis. Pulmonic Valve: The pulmonic valve was not well visualized. Pulmonic valve regurgitation is not visualized.  No evidence of pulmonic stenosis. Aorta: The aortic root is normal in size and structure. Venous: The inferior vena cava is normal in size with greater than 50% respiratory variability, suggesting right atrial pressure of 3 mmHg. IAS/Shunts: No atrial level shunt detected by color flow Doppler. Additional Comments: Normal LV  systolic function; grade 1 diastolic dysfunction.  LEFT VENTRICLE PLAX 2D LVIDd:         4.40 cm      Diastology LVIDs:         3.40 cm      LV e' lateral:   7.07 cm/s LV PW:         0.90 cm      LV E/e' lateral: 7.0 LV IVS:        0.90 cm      LV e' medial:    6.20 cm/s LVOT diam:     2.20 cm      LV E/e' medial:  8.0 LV SV:         77 LV SV Index:   41 LVOT Area:     3.80 cm  LV Volumes (MOD) LV vol d, MOD A4C: 121.0 ml LV vol s, MOD A4C: 55.0 ml LV SV MOD A4C:     121.0 ml RIGHT VENTRICLE RV S prime:     16.60 cm/s TAPSE (M-mode): 2.3 cm LEFT ATRIUM             Index       RIGHT ATRIUM           Index LA diam:        3.40 cm 1.80 cm/m  RA Area:     18.60 cm LA Vol (A2C):   59.4 ml 31.46 ml/m RA Volume:   51.20 ml  27.12 ml/m LA Vol (A4C):   24.6 ml 13.03 ml/m LA Biplane Vol: 40.0 ml 21.18 ml/m  AORTIC VALVE LVOT Vmax:   108.00 cm/s LVOT Vmean:  75.500 cm/s LVOT VTI:    0.202 m  AORTA Ao Root diam: 3.20 cm Ao Asc diam:  3.70 cm MITRAL VALVE MV Area (PHT): 3.37 cm    SHUNTS MV Decel Time: 225 msec    Systemic VTI:  0.20 m MV E velocity: 49.30 cm/s  Systemic Diam: 2.20 cm MV A velocity: 93.00 cm/s MV E/A ratio:  0.53 Kirk Ruths MD Electronically signed by Kirk Ruths MD Signature Date/Time: 09/04/2019/11:36:38 AM    Final       Discharge Exam: Vitals:   09/04/19 1206 09/04/19 1209  BP:    Pulse: 97 99  Resp:    Temp:    SpO2:     Vitals:   09/04/19 1204 09/04/19 1205 09/04/19 1206 09/04/19 1209  BP: (!) 143/75     Pulse: 84 89 97 99  Resp: 18     Temp: 98.2 F (36.8 C)     TempSrc: Oral     SpO2:      Weight:      Height:        General: Pt is alert, awake, not in acute distress Cardiovascular: RRR, S1/S2 +, no rubs, no gallops Respiratory: CTA bilaterally, no wheezing, no rhonchi Abdominal: Soft, NT, ND, bowel sounds + Extremities: no edema, no cyanosis    The results of significant diagnostics from this hospitalization (including imaging, microbiology, ancillary and  laboratory) are listed below for reference.     Microbiology: Recent Results (from the past 240 hour(s))  SARS CORONAVIRUS 2 (TAT 6-24 HRS)  Nasopharyngeal Nasopharyngeal Swab     Status: None   Collection Time: 09/03/19  6:05 PM   Specimen: Nasopharyngeal Swab  Result Value Ref Range Status   SARS Coronavirus 2 NEGATIVE NEGATIVE Final    Comment: (NOTE) SARS-CoV-2 target nucleic acids are NOT DETECTED. The SARS-CoV-2 RNA is generally detectable in upper and lower respiratory specimens during the acute phase of infection. Negative results do not preclude SARS-CoV-2 infection, do not rule out co-infections with other pathogens, and should not be used as the sole basis for treatment or other patient management decisions. Negative results must be combined with clinical observations, patient history, and epidemiological information. The expected result is Negative. Fact Sheet for Patients: SugarRoll.be Fact Sheet for Healthcare Providers: https://www.woods-mathews.com/ This test is not yet approved or cleared by the [place] FDA and  has been authorized for detection and/or diagnosis of SARS-CoV-2 by FDA under an Emergency Use Authorization (EUA). This EUA will remain  in effect (meaning this test can be used) for the duration of the COVID-19 declaration under Section 56 4(b)(1) of the Act, 21 U.S.C. section 360bbb-3(b)(1), unless the authorization is terminated or revoked sooner. Performed at [facility], [place] [address], [place], [place] [address]      Labs: BNP (last 3 results) No results for input(s): BNP in the last 8760 hours. Basic Metabolic Panel: Recent Labs  Lab 09/03/19 1508 09/03/19 1914 09/04/19 0852  NA 140  --  140  K 3.3*  --  3.7  CL 106  --  108  CO2 21*  --  22  GLUCOSE 138*  --  117*  BUN 15  --  9  CREATININE 1.12 0.98 1.06  CALCIUM 9.6  --  9.4   Liver Function Tests: Recent Labs  Lab  09/03/19 1508  AST 24  ALT 17  ALKPHOS 67  BILITOT 1.6*  PROT 7.6  ALBUMIN 4.4   No results for input(s): LIPASE, AMYLASE in the last 168 hours. No results for input(s): AMMONIA in the last 168 hours. CBC: Recent Labs  Lab 09/03/19 1508 09/03/19 1917 09/04/19 0852  WBC 10.3 14.5* 10.2  NEUTROABS 8.5*  --   --   HGB 17.7* 17.7* 16.5  HCT 48.3 48.0 45.6  MCV 89.1 89.1 89.8  PLT 220 215 210   Cardiac Enzymes: No results for input(s): CKTOTAL, CKMB, CKMBINDEX, TROPONINI in the last 168 hours. BNP: Invalid input(s): POCBNP CBG: No results for input(s): GLUCAP in the last 168 hours. D-Dimer No results for input(s): DDIMER in the last 72 hours. Hgb A1c Recent Labs    09/03/19 1508  HGBA1C 4.9   Lipid Profile Recent Labs    09/03/19 1508  CHOL 216*  HDL 74  LDLCALC 124*  TRIG 89  CHOLHDL 2.9   Thyroid function studies No results for input(s): TSH, T4TOTAL, T3FREE, THYROIDAB in the last 72 hours.  Invalid input(s): FREET3 Anemia work up No results for input(s): VITAMINB12, FOLATE, FERRITIN, TIBC, IRON, RETICCTPCT in the last 72 hours. Urinalysis    Component Value Date/Time   COLORURINE YELLOW 09/03/2019 1458   APPEARANCEUR CLEAR 09/03/2019 1458   LABSPEC 1.011 09/03/2019 1458   PHURINE 7.0 09/03/2019 1458   GLUCOSEU NEGATIVE 09/03/2019 1458   HGBUR NEGATIVE 09/03/2019 1458   BILIRUBINUR NEGATIVE 09/03/2019 1458   KETONESUR 5 (A) 09/03/2019 1458   PROTEINUR NEGATIVE 09/03/2019 1458   NITRITE NEGATIVE 09/03/2019 1458   LEUKOCYTESUR NEGATIVE 09/03/2019 1458   Sepsis Labs Invalid input(s): PROCALCITONIN,  WBC,  [place] Microbiology Recent Results (from the past 240 hour(s))  SARS CORONAVIRUS 2 (TAT 6-24 HRS) Nasopharyngeal Nasopharyngeal Swab     Status: None   Collection Time: 09/03/19  6:05 PM   Specimen: Nasopharyngeal Swab  Result Value Ref Range Status   SARS Coronavirus 2 NEGATIVE NEGATIVE Final    Comment: (NOTE) SARS-CoV-2 target nucleic  acids are NOT DETECTED. The SARS-CoV-2 RNA is generally detectable in upper and lower respiratory specimens during the acute phase of infection. Negative results do not preclude SARS-CoV-2 infection, do not rule out co-infections with other pathogens, and should not be used as the sole basis for treatment or other patient management decisions. Negative results must be combined with clinical observations, patient history, and epidemiological information. The expected result is Negative. Fact Sheet for Patients: SugarRoll.be Fact Sheet for Healthcare Providers: https://www.woods-mathews.com/ This test is not yet approved or cleared by the [place] FDA and  has been authorized for detection and/or diagnosis of SARS-CoV-2 by FDA under an Emergency Use Authorization (EUA). This EUA will remain  in effect (meaning this test can be used) for the duration of the COVID-19 declaration under Section 56 4(b)(1) of the Act, 21 U.S.C. section 360bbb-3(b)(1), unless the authorization is terminated or revoked sooner. Performed at [facility], [place] [address], [place], [place] [address]      Time coordinating discharge: Over 30 minutes  SIGNED:   Darliss Cheney, MD  [facility] 09/04/2019, 1:59 PM  If 7PM-7AM, please contact night-coverage [email or web address]

## 2019-09-04 NOTE — Progress Notes (Signed)
Rehab Admissions Coordinator Note:  Per PT recommendation, patient was screened by Michel Santee for appropriateness for an Inpatient Acute Rehab Consult.  At this time, we are recommending Inpatient Rehab consult. I will place an order per our protocol.   Michel Santee 09/04/2019, 4:51 PM  I can be reached at [phone].

## 2019-09-04 NOTE — Progress Notes (Signed)
Echocardiogram 2D Echocardiogram has been performed.  Oneal Deputy Sadrac Zeoli 09/04/2019, 8:08 AM

## 2019-09-04 NOTE — Evaluation (Addendum)
Physical Therapy Evaluation Patient Details Name: Elijah [name] MRN: [id number] DOB: May 12, 1935 Today's Date: 09/04/2019   History of Present Illness  85 y.o. 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   Clinical Impression  Pt admitted with above diagnosis. Pt was able to ambulate with RW but needed min assist due to left bias when walking and continued dizziness due to right hypofunction of vestibular system. X1 exercises initiated and handout given for exercise progression.  BP 151/84 and HR 87 bpm during session.  Gave pt a "FAlls at home" handout as well. Discussed with pt that he will need 24 hour care initiallyand he understands.  Son and family to assist per pt. Issued gait belt for safety.  Pt currently with functional limitations due to the deficits listed below (see PT Problem List). Pt will benefit from skilled PT to increase their independence and safety with mobility to allow discharge to the venue listed below.      Follow Up Recommendations Home health PT;Supervision/Assistance - 24 hour    Equipment Recommendations  None recommended by PT ; issued gait belt   Recommendations for Other Services       Precautions / Restrictions Precautions Precautions: Fall Restrictions Weight Bearing Restrictions: No      Mobility  Bed Mobility Overal bed mobility: Needs Assistance Bed Mobility: Supine to Sit     Supine to sit: Supervision     General bed mobility comments: No assist needed to come to EOB  Transfers Overall  transfer level: Needs assistance Equipment used: Rolling walker (2 wheeled) Transfers: Sit to/from [facility] Sit to Stand: Min assist Stand pivot transfers: Min assist       General transfer comment: L bias, RW helps but pt tends to lean left unless cued verbally and tactilly.   Ambulation/Gait Ambulation/Gait assistance: Min assist Gait Distance (Feet): 75 Feet Assistive device: Rolling walker (2 wheeled) Gait Pattern/deviations: Step-through pattern;Decreased stride length;Staggering left;Drifts right/left   Gait velocity interpretation: <1.8 ft/sec, indicate of risk for recurrent falls General Gait Details: Pt was able to ambulate in hallway but needed compensatory strategies to decr balance issues as well as verbal and tactile cues.  Pt with left bias needing steadying assist at times.  Assist to steer RW as well.   Stairs            Wheelchair Mobility    Modified Rankin (Stroke Patients Only) Modified Rankin (Stroke Patients Only) Pre-Morbid Rankin Score: No symptoms Modified Rankin: Moderately severe disability     Balance Overall balance assessment: Needs assistance Sitting-balance support: No upper extremity supported;Feet supported Sitting balance-Leahy Scale: Fair     Standing balance support: Bilateral upper extremity supported;During functional activity Standing balance-Leahy Scale: Poor Standing balance comment: L bias; dependent on external support and RW                             Pertinent Vitals/Pain Pain Assessment: No/denies pain    Home Living Family/patient expects to be discharged to:: Private  residence Living Arrangements: Spouse/significant other Available Help at Discharge: Family;Available 24 hours/day(wife vision is bad) Type of Home: Other(Comment)(condo) Home Access: Level entry     Home Layout: Two level;Able to live on main level with bedroom/bathroom Home Equipment: Grab bars - tub/shower;Walker  - 2 wheels;Walker - 4 wheels;Cane - single point;Shower seat - built in;Hand held shower head;Wheelchair - manual      Prior Function Level of Independence: Independent         Comments: drives; wife is unable to drive but family is able to assist     Hand Dominance   Dominant Hand: Right    Extremity/Trunk Assessment   Upper Extremity Assessment Upper Extremity Assessment: Defer to OT evaluation RUE Deficits / Details: mild sensory motor deficits noted with RUE, but using functionally RUE Coordination: decreased fine motor(arthritic changes affect coordination as well)    Lower Extremity Assessment Lower Extremity Assessment: RLE deficits/detail RLE Deficits / Details: grossly 3+/5 RLE Coordination: decreased fine motor    Cervical / Trunk Assessment Cervical / Trunk Assessment: Other exceptions(L bias)  Communication   Communication: No difficulties  Cognition Arousal/Alertness: Awake/alert Behavior During Therapy: WFL for tasks assessed/performed Overall Cognitive Status: Within Functional Limits for tasks assessed                                        General Comments General comments (skin integrity, edema, etc.): Pt appears to have right hypofunction of vestibular system with positive head thrust right.  Pt given x1 exercises and compensatory strategies initiated. Also gave pt a falls at home handout.     Exercises Other Exercises Other Exercises: initiated x1 exercise with pt demonstration of x 1 exercise - progression of exercise program emailed to pt and hard copy given.  Access code [email or web address]   Assessment/Plan    PT Assessment Patient needs continued PT services  PT Problem List Decreased activity tolerance;Decreased balance;Decreased mobility;Decreased knowledge of use of DME;Decreased safety awareness;Decreased knowledge of precautions;Decreased coordination;Other (comment)(vestibular hypofunction)       PT Treatment Interventions DME  instruction;Gait training;Functional mobility training;Therapeutic activities;Therapeutic exercise;Balance training;Patient/family education    PT Goals (Current goals can be found in the Care Plan section)  Acute Rehab PT Goals Patient Stated Goal: to go home PT Goal Formulation: With patient Time For Goal Achievement: 09/18/19 Potential to Achieve Goals: Good    Frequency Min 4X/week   Barriers to discharge        Co-evaluation               AM-PAC PT "6 Clicks" Mobility  Outcome Measure Help needed turning from your back to your side while in a flat bed without using bedrails?: None Help needed moving from lying on your back to sitting on the side of a flat bed without using bedrails?: None Help needed moving to and from a bed to a chair (including a wheelchair)?: A Little Help needed standing up from a chair using your arms (e.g., wheelchair or bedside chair)?: A Little Help needed to walk in hospital room?: A Little Help needed climbing 3-5 steps with a railing? : A Lot 6 Click Score: 19    End of Session Equipment Utilized During Treatment: Gait belt Activity Tolerance: Patient tolerated treatment well Patient left: in chair;with call bell/phone within reach Nurse Communication: Mobility status PT Visit Diagnosis: Unsteadiness on feet (R26.81);Muscle weakness (generalized) (M62.81);Dizziness and giddiness (R42)  Time: [id number] PT Time Calculation (min) (ACUTE ONLY): 56 min   Charges:   PT Evaluation $PT Eval Moderate Complexity: 1 Mod PT Treatments $Gait Training: 8-22 mins $Therapeutic Exercise: 8-22 mins $Therapeutic Activity: 8-22 mins        Caden Fukushima W,PT Acute Rehabilitation Services Pager:  [phone]  Office:  [place] 09/04/2019, 11:53 AM

## 2019-09-04 NOTE — Progress Notes (Signed)
Elijah [name] is not doing as well this afternoon. His son came in to work with him and toward the end of OT session, he complained of feeling lightneaded in standing and seeing things move on the walls. BP sitting 132/83; standing 111/86. He was symptomatic. He had difficulty standing earlier to use the urinal while his son was in the room and reportedly he was leaning L in standing and she prevented the fall.  He is a high fall risk and he himself and his family are not comfortable going home with this high fall risk which is completely reasonable.  For this reason, his discharge has been canceled.  We will keep him in the hospital overnight.

## 2019-09-04 NOTE — Progress Notes (Signed)
Occupational Therapy Evaluation Patient Details Name: Elijah [name] MRN: [id number] DOB: 03-10-1935 Today's Date: 09/04/2019    History of Present Illness 85 y.o. 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    Clinical Impression   PTA, pt lived independently at home with his visually impaired wife, drove and was active within his [place]. Pt reports he is feeling better. Pt presents with L bias with mobility, is a risk for falls and requires Min A for ADL and mobility @ RW level. Began educating pt on compensatory strategies for balance deficits to increase safety with ADL and mobility. Pt is not interested in CIR and would like to DC home. Pt will need family member other than his wife for all mobility and ADL. Pt reports his son can assist at this level. Will follow acutely to facilitate safe DC home.     Follow Up Recommendations  Home health OT;Supervision/Assistance - 24 hour    Equipment Recommendations  3 in 1 bedside commode    Recommendations for Other Services       Precautions / Restrictions Precautions Precautions: Fall Restrictions Weight Bearing Restrictions: No      Mobility Bed Mobility               General bed mobility comments: OOB in chair  Transfers Overall transfer level: Needs assistance   Transfers: Sit to/from Stand;Stand Pivot Transfers Sit to Stand: Min assist Stand pivot transfers: Min assist       General transfer comment: L bias    Balance Overall balance assessment: Needs assistance    Sitting balance-Leahy Scale: Fair       Standing balance-Leahy Scale: Poor Standing balance comment: L bias; dependent on external support                           ADL either performed or assessed with clinical judgement   ADL Overall ADL's : Needs assistance/impaired Eating/Feeding: Independent   Grooming: Minimal assistance;Standing   Upper Body Bathing: Set up;Sitting   Lower Body Bathing: Minimal assistance;Sit to/from stand   Upper Body Dressing : Set up;Sitting   Lower Body Dressing: Minimal assistance;Sit to/from stand   Toilet Transfer: Minimal assistance;Ambulation;RW   Toileting- Clothing Manipulation and Hygiene: Minimal assistance       Functional mobility during ADLs: Minimal assistance;Rolling walker;Cueing for safety General ADL Comments: Educated on compensatory strategies regardingi bringing feet up toward him rather than bending forward; educated on using focal point when moving to reduce symptoms of dizziness     Vision Baseline Vision/History: Wears glasses Wears Glasses: Reading only Patient Visual Report: No change from baseline Vision Assessment?: No apparent visual deficits     Perception     Praxis      Pertinent Vitals/Pain Pain Assessment: No/denies pain     Hand Dominance Right   Extremity/Trunk Assessment Upper Extremity Assessment Upper Extremity Assessment: RUE deficits/detail RUE Deficits / Details: mild sensory motor deficits noted with RUE, but using functionally RUE Coordination: decreased fine motor(arthritic changes affect coordination  as well)   Lower Extremity Assessment Lower Extremity Assessment: Defer to PT evaluation   Cervical / Trunk Assessment Cervical / Trunk Assessment: Other exceptions(L bias)   Communication Communication Communication: No difficulties   Cognition Arousal/Alertness: Awake/alert Behavior During Therapy: WFL for tasks assessed/performed Overall Cognitive Status: Within  Functional Limits for tasks assessed                                     General Comments       Exercises Exercises: Other exercises Other Exercises Other Exercises: demonstrated x 1 exercise   Shoulder Instructions      Home Living Family/patient expects to be discharged to:: Private residence Living Arrangements: Spouse/significant other Available Help at Discharge: Family;Available 24 hours/day(wife vision is bad) Type of Home: Other(Comment)(condo) Home Access: Level entry     Home Layout: Two level;Able to live on main level with bedroom/bathroom     Bathroom Shower/Tub: Walk-in shower;Tub/shower unit(pt uses walk in shower)   Bathroom Toilet: Standard Bathroom Accessibility: Yes How Accessible: Accessible via walker Home Equipment: Grab bars - tub/shower;Walker - 2 wheels;Walker - 4 wheels;Cane - single point;Shower seat - built in;Hand held shower head          Prior Functioning/Environment Level of Independence: Independent        Comments: drives; wife in unable to drive but family is able to assist        OT Problem List: Decreased activity tolerance;Impaired balance (sitting and/or standing);Decreased coordination;Decreased safety awareness;Decreased knowledge of use of DME or AE      OT Treatment/Interventions: Self-care/ADL training;Therapeutic exercise;Neuromuscular education;DME and/or AE instruction;Therapeutic activities;Visual/perceptual remediation/compensation;Patient/family education;Balance training    OT Goals(Current goals can be found in the care plan section) Acute Rehab OT Goals Patient Stated Goal: to go home OT Goal Formulation: With patient Time For Goal Achievement: 09/18/19 Potential to Achieve Goals: Good  OT Frequency: Min 3X/week   Barriers to D/C:            Co-evaluation              AM-PAC OT "6 Clicks" Daily Activity     Outcome Measure Help from another person eating meals?: None Help from  another person taking care of personal grooming?: A Little Help from another person toileting, which includes using toliet, bedpan, or urinal?: A Little Help from another person bathing (including washing, rinsing, drying)?: A Little Help from another person to put on and taking off regular upper body clothing?: A Little Help from another person to put on and taking off regular lower body clothing?: A Little 6 Click Score: 19   End of Session Equipment Utilized During Treatment: Rolling walker Nurse Communication: Mobility status(walk on pt's L side)  Activity Tolerance: Patient tolerated treatment well Patient left: in chair;with call bell/phone within reach  OT Visit Diagnosis: Unsteadiness on feet (R26.81);Other abnormalities of gait and mobility (R26.89);Ataxia, unspecified (R27.0)                Time: 1050-1120 OT Time Calculation (min): 30 min Charges:  OT General Charges $OT Visit: 1 Visit OT Evaluation $OT Eval Moderate Complexity: 1 Mod OT Treatments $Self Care/Home Management : 8-22 mins  Maurie Boettcher, OT/L   Acute OT Clinical Specialist Acute Rehabilitation Services Pager [phone] Office [phone]   [facility] 09/04/2019, 11:33 AM

## 2019-09-04 NOTE — Progress Notes (Signed)
STROKE TEAM PROGRESS NOTE   INTERVAL HISTORY Pt lying in bed, stated that he felt better than yesterday. He could not get up at all yesterday but today he was able to work with PT/OT with walker although leaning towards on the left. He recounted HPI with me that he was changing his clothes and had sudden onset vertigo and imbalance, had to lower himself down to the ground, not able to walk due to imbalance. The dizziness still present now but much better than yesterday. No N/V, no diplopia, no hearing change. Never had it before.   Vitals:   09/04/19 0058 09/04/19 0308 09/04/19 0518 09/04/19 0812  BP: 140/86 (!) 142/84 139/82   Pulse: 91 86 89   Resp: 16 17 16    Temp: 98.8 F (37.1 C) 99 F (37.2 C) 98.6 F (37 C)   TempSrc: Oral Oral Oral   SpO2: 98% 98% 95%   Weight:    70.7 kg  Height:    5\' 7"  (1.702 m)    CBC:  Recent Labs  Lab 09/03/19 1508 09/03/19 1508 09/03/19 1917 09/04/19 0852  WBC 10.3   < > 14.5* 10.2  NEUTROABS 8.5*  --   --   --   HGB 17.7*   < > 17.7* 16.5  HCT 48.3   < > 48.0 45.6  MCV 89.1   < > 89.1 89.8  PLT 220   < > 215 210   < > = values in this interval not displayed.    Basic Metabolic Panel:  Recent Labs  Lab 09/03/19 1508 09/03/19 1508 09/03/19 1914 09/04/19 0852  NA 140  --   --  140  K 3.3*  --   --  3.7  CL 106  --   --  108  CO2 21*  --   --  22  GLUCOSE 138*  --   --  117*  BUN 15  --   --  9  CREATININE 1.12   < > 0.98 1.06  CALCIUM 9.6  --   --  9.4   < > = values in this interval not displayed.   Lipid Panel:     Component Value Date/Time   CHOL 216 (H) 09/03/2019 1508   TRIG 89 09/03/2019 1508   HDL 74 09/03/2019 1508   CHOLHDL 2.9 09/03/2019 1508   VLDL 18 09/03/2019 1508   LDLCALC 124 (H) 09/03/2019 1508   HgbA1c:  Lab Results  Component Value Date   HGBA1C 4.9 09/03/2019   Urine Drug Screen: No results found for: LABOPIA, COCAINSCRNUR, LABBENZ, AMPHETMU, THCU, LABBARB  Alcohol Level No results found for:  ETH  IMAGING past 24 hours CT Angio Head W or Wo Contrast  Result Date: 09/03/2019 CLINICAL DATA:  Carotid artery stenosis. Near syncopal episode today. Dizziness. EXAM: CT ANGIOGRAPHY HEAD AND NECK TECHNIQUE: Multidetector CT imaging of the head and neck was performed using the standard protocol during bolus administration of intravenous contrast. Multiplanar CT image reconstructions and MIPs were obtained to evaluate the vascular anatomy. Carotid stenosis measurements (when applicable) are obtained utilizing NASCET criteria, using the distal internal carotid diameter as the denominator. CONTRAST:  21mL OMNIPAQUE IOHEXOL 350 MG/ML SOLN COMPARISON:  Brain MRI performed earlier the same day 09/03/2019. FINDINGS: CT HEAD FINDINGS Brain: There is no evidence of acute intracranial hemorrhage, intracranial mass, midline shift or extra-axial fluid collection.No demarcated cortical infarction. A moderate-sized acute infarct within the right cerebellum in the right PICA vascular territory was better appreciated  on same-day brain MRI. Redemonstrated chronic infarcts within the left cerebellum. Background mild generalized parenchymal atrophy and chronic small vessel ischemic disease. Vascular: Reported separately. Skull: Normal. Negative for fracture or focal lesion. Sinuses: No significant paranasal sinus disease or mastoid effusion. Orbits: Visualized orbits demonstrate no acute abnormality. Review of the MIP images confirms the above findings CTA NECK FINDINGS Aortic arch: Standard aortic branching. Calcified plaque within the visualized aortic arch and proximal major branch vessels of the neck. No significant innominate or proximal subclavian artery stenosis. Right carotid system: CCA and ICA patent within the neck without significant stenosis (50% or greater). Mild mixed plaque within the proximal ICA. Left carotid system: CCA and ICA patent within the neck without significant stenosis (50% or greater). Minimal mixed  plaque within the proximal ICA. Vertebral arteries: Predominantly soft plaque results in severe stenosis at the origin of the right vertebral artery. Portions of the proximal V2 right vertebral artery appears severely stenosed or occluded (for instance as seen on series 11, image 237). More distally, the V2 and V3 right vertebral artery is patent. The V1 left vertebral artery appears occluded. There is reconstitution of flow within the V2 and left vertebral artery, although with a diminutive appearance of this vessel. Skeleton: No acute bony abnormality cervical spondylosis with multilevel disc height loss, posterior disc osteophytes, uncovertebral and facet hypertrophy. Other neck: No neck mass or cervical lymphadenopathy. Subcentimeter thyroid nodules not meeting consensus criteria for ultrasound follow-up. Upper chest: No consolidation within the imaged lung apices. Review of the MIP images confirms the above findings CTA HEAD FINDINGS Anterior circulation: The intracranial internal carotid arteries are patent with scattered calcified plaque. No significant stenosis in these vessels. The M1 middle cerebral arteries are patent without significant stenosis. No M2 proximal branch occlusion or high-grade proximal stenosis is identified. The anterior cerebral arteries are patent. Moderate focal stenosis within a proximal to mid A2 right anterior cerebral artery branch. No intracranial aneurysm is identified. Posterior circulation: The intracranial right vertebral artery is markedly diminutive beyond the origin of the right PICA but appears patent. There is prominent atherosclerotic irregularity of the intracranial left vertebral artery with sites of severe stenosis. There is a paucity of flow within the right PICA shortly beyond its origin. The basilar artery is diminutive with multifocal atherosclerotic irregularity and sites of moderate to moderately severe stenosis. There are sizable bilateral posterior  communicating arteries. The P1 left posterior cerebral artery is highly stenotic or developmentally diminutive. Otherwise, no significant proximal stenosis within these vessels. Venous sinuses: Within limitations of contrast timing, no convincing thrombus. Anatomic variants: As described. Review of the MIP images confirms the above findings These results were called by telephone at the time of interpretation on 09/03/2019 at 5:57 pm to provider DAVID YAO , who verbally acknowledged these results. IMPRESSION: CT head: 1. A moderate-sized acute right PICA vascular territory cerebellar infarct was better appreciated on same-day brain MRI. 2. Redemonstrated chronic infarcts within the left cerebellum. 3. Background mild generalized parenchymal atrophy and chronic small vessel ischemic disease. CTA neck: 1. The bilateral common and internal carotid arteries are patent within the neck without significant stenosis. Mild atherosclerotic disease within the proximal ICAs. 2. Severe stenosis at the origin of the right vertebral artery. Portions of the proximal V2 right vertebral artery appear highly stenosed or occluded. There is reconstitution of flow within the more distal V2 and V3 segments. 3. The V1 left vertebral artery is occluded. There is reconstitution of flow within the V2 and V3  segments, although with an irregular and diminutive appearance of this vessel. CTA head: 1. The intracranial right vertebral artery is markedly diminutive beyond the origin of the right PICA, although patent. There is a paucity of flow within the right PICA shortly beyond its origin and this vessel is likely occluded given findings on same-day brain MRI. 2. Prominent atherosclerotic irregularity of the intracranial left vertebral artery with sites of severe stenosis. 3. The basilar artery is diminutive with atherosclerotic irregularity and sites of up to moderate/moderately severe stenosis. 4. Sizable bilateral posterior communicating  arteries. he P1 left PCA is either developmentally diminutive or severely stenotic. Electronically Signed   By: Kellie Simmering DO   On: 09/03/2019 17:55   CT Angio Neck W and/or Wo Contrast  Result Date: 09/03/2019 CLINICAL DATA:  Carotid artery stenosis. Near syncopal episode today. Dizziness. EXAM: CT ANGIOGRAPHY HEAD AND NECK TECHNIQUE: Multidetector CT imaging of the head and neck was performed using the standard protocol during bolus administration of intravenous contrast. Multiplanar CT image reconstructions and MIPs were obtained to evaluate the vascular anatomy. Carotid stenosis measurements (when applicable) are obtained utilizing NASCET criteria, using the distal internal carotid diameter as the denominator. CONTRAST:  75mL OMNIPAQUE IOHEXOL 350 MG/ML SOLN COMPARISON:  Brain MRI performed earlier the same day 09/03/2019. FINDINGS: CT HEAD FINDINGS Brain: There is no evidence of acute intracranial hemorrhage, intracranial mass, midline shift or extra-axial fluid collection.No demarcated cortical infarction. A moderate-sized acute infarct within the right cerebellum in the right PICA vascular territory was better appreciated on same-day brain MRI. Redemonstrated chronic infarcts within the left cerebellum. Background mild generalized parenchymal atrophy and chronic small vessel ischemic disease. Vascular: Reported separately. Skull: Normal. Negative for fracture or focal lesion. Sinuses: No significant paranasal sinus disease or mastoid effusion. Orbits: Visualized orbits demonstrate no acute abnormality. Review of the MIP images confirms the above findings CTA NECK FINDINGS Aortic arch: Standard aortic branching. Calcified plaque within the visualized aortic arch and proximal major branch vessels of the neck. No significant innominate or proximal subclavian artery stenosis. Right carotid system: CCA and ICA patent within the neck without significant stenosis (50% or greater). Mild mixed plaque within the  proximal ICA. Left carotid system: CCA and ICA patent within the neck without significant stenosis (50% or greater). Minimal mixed plaque within the proximal ICA. Vertebral arteries: Predominantly soft plaque results in severe stenosis at the origin of the right vertebral artery. Portions of the proximal V2 right vertebral artery appears severely stenosed or occluded (for instance as seen on series 11, image 237). More distally, the V2 and V3 right vertebral artery is patent. The V1 left vertebral artery appears occluded. There is reconstitution of flow within the V2 and left vertebral artery, although with a diminutive appearance of this vessel. Skeleton: No acute bony abnormality cervical spondylosis with multilevel disc height loss, posterior disc osteophytes, uncovertebral and facet hypertrophy. Other neck: No neck mass or cervical lymphadenopathy. Subcentimeter thyroid nodules not meeting consensus criteria for ultrasound follow-up. Upper chest: No consolidation within the imaged lung apices. Review of the MIP images confirms the above findings CTA HEAD FINDINGS Anterior circulation: The intracranial internal carotid arteries are patent with scattered calcified plaque. No significant stenosis in these vessels. The M1 middle cerebral arteries are patent without significant stenosis. No M2 proximal branch occlusion or high-grade proximal stenosis is identified. The anterior cerebral arteries are patent. Moderate focal stenosis within a proximal to mid A2 right anterior cerebral artery branch. No intracranial aneurysm is identified. Posterior  circulation: The intracranial right vertebral artery is markedly diminutive beyond the origin of the right PICA but appears patent. There is prominent atherosclerotic irregularity of the intracranial left vertebral artery with sites of severe stenosis. There is a paucity of flow within the right PICA shortly beyond its origin. The basilar artery is diminutive with multifocal  atherosclerotic irregularity and sites of moderate to moderately severe stenosis. There are sizable bilateral posterior communicating arteries. The P1 left posterior cerebral artery is highly stenotic or developmentally diminutive. Otherwise, no significant proximal stenosis within these vessels. Venous sinuses: Within limitations of contrast timing, no convincing thrombus. Anatomic variants: As described. Review of the MIP images confirms the above findings These results were called by telephone at the time of interpretation on 09/03/2019 at 5:57 pm to provider DAVID YAO , who verbally acknowledged these results. IMPRESSION: CT head: 1. A moderate-sized acute right PICA vascular territory cerebellar infarct was better appreciated on same-day brain MRI. 2. Redemonstrated chronic infarcts within the left cerebellum. 3. Background mild generalized parenchymal atrophy and chronic small vessel ischemic disease. CTA neck: 1. The bilateral common and internal carotid arteries are patent within the neck without significant stenosis. Mild atherosclerotic disease within the proximal ICAs. 2. Severe stenosis at the origin of the right vertebral artery. Portions of the proximal V2 right vertebral artery appear highly stenosed or occluded. There is reconstitution of flow within the more distal V2 and V3 segments. 3. The V1 left vertebral artery is occluded. There is reconstitution of flow within the V2 and V3 segments, although with an irregular and diminutive appearance of this vessel. CTA head: 1. The intracranial right vertebral artery is markedly diminutive beyond the origin of the right PICA, although patent. There is a paucity of flow within the right PICA shortly beyond its origin and this vessel is likely occluded given findings on same-day brain MRI. 2. Prominent atherosclerotic irregularity of the intracranial left vertebral artery with sites of severe stenosis. 3. The basilar artery is diminutive with atherosclerotic  irregularity and sites of up to moderate/moderately severe stenosis. 4. Sizable bilateral posterior communicating arteries. he P1 left PCA is either developmentally diminutive or severely stenotic. Electronically Signed   By: Kellie Simmering DO   On: 09/03/2019 17:55   MR BRAIN WO CONTRAST  Addendum Date: 09/03/2019   ADDENDUM REPORT: 09/03/2019 17:18 ADDENDUM: These results were called by telephone at the time of interpretation on 09/03/2019 at 5:18 pm to provider DAVID YAO , who verbally acknowledged these results. Electronically Signed   By: Kellie Simmering DO   On: 09/03/2019 17:18   Result Date: 09/03/2019 CLINICAL DATA:  Dizziness, rule out stroke; headache, intracranial hemorrhage suspected. EXAM: MRI HEAD WITHOUT CONTRAST TECHNIQUE: Multiplanar, multiecho pulse sequences of the brain and surrounding structures were obtained without intravenous contrast. COMPARISON:  No pertinent prior studies available for comparison. FINDINGS: Brain: There is a moderate-sized focus of restricted diffusion within the right cerebellum in the right PICA vascular territory consistent with acute infarction. There is an additional punctate focus of acute infarction within the superior right cerebellum (series 5, image 60). No evidence of intracranial mass. No midline shift or extra-axial fluid collection. No chronic intracranial blood products. Chronic left cerebellar infarcts. Minimal scattered T2/FLAIR hyperintensity within the cerebral white matter is nonspecific, but consistent with chronic small vessel ischemic disease. Moderate generalized parenchymal atrophy. Vascular: Abnormal appearance of the intracranial left vertebral artery which may reflect high-grade stenosis or occlusion. Flow voids otherwise maintained within the proximal large arterial vessels. Skull  and upper cervical spine: No focal marrow lesion. Incompletely assessed upper cervical spondylosis. Sinuses/Orbits: Prior right lens replacement. No significant  paranasal sinus disease or mastoid effusion IMPRESSION: Moderate-sized acute infarct within the right cerebellum in the right PICA vascular territory. Additional punctate acute infarct more superiorly within the right cerebellum. Chronic left cerebellar infarcts. Background moderate generalized parenchymal atrophy with mild chronic small vessel ischemic disease. Abnormal appearance of the intracranial left vertebral artery which may reflect high-grade stenosis or occlusion. Electronically Signed: By: Kellie Simmering DO On: 09/03/2019 17:11   ECHOCARDIOGRAM COMPLETE  Result Date: 09/04/2019    ECHOCARDIOGRAM REPORT   Patient Name:   YOBANI CRONE Date of Exam: 09/04/2019 Medical Rec #:  [id number]      Height:       68.0 in Accession #:    [id number]     Weight:       166.0 lb Date of Birth:  02-16-1935       BSA:          1.888 m Patient Age:    59 years       BP:           139/82 mmHg Patient Gender: M              HR:           74 bpm. Exam Location:  Inpatient Procedure: 2D Echo, Color Doppler and Cardiac Doppler Indications:    Stroke i163.9  History:        Patient has no prior history of Echocardiogram examinations.                 Risk Factors:Hypertension and Dyslipidemia.  Sonographer:    Raquel Sarna Senior RDCS Referring Phys: [id number] [place]  1. Normal LV systolic function; grade 1 diastolic dysfunction.  2. Left ventricular ejection fraction, by estimation, is 55 to 60%. The left ventricle has normal function. The left ventricle has no regional wall motion abnormalities. Left ventricular diastolic parameters are consistent with Grade I diastolic dysfunction (impaired relaxation).  3. Right ventricular systolic function is normal. The right ventricular size is normal.  4. The mitral valve is normal in structure. Trivial mitral valve regurgitation. No evidence of mitral stenosis.  5. The aortic valve is tricuspid. Aortic valve regurgitation is not visualized. Mild aortic valve sclerosis is  present, with no evidence of aortic valve stenosis.  6. The inferior vena cava is normal in size with greater than 50% respiratory variability, suggesting right atrial pressure of 3 mmHg. FINDINGS  Left Ventricle: Left ventricular ejection fraction, by estimation, is 55 to 60%. The left ventricle has normal function. The left ventricle has no regional wall motion abnormalities. The left ventricular internal cavity size was normal in size. There is  no left ventricular hypertrophy. Left ventricular diastolic parameters are consistent with Grade I diastolic dysfunction (impaired relaxation). Right Ventricle: The right ventricular size is normal. Right ventricular systolic function is normal. Left Atrium: Left atrial size was normal in size. Right Atrium: Right atrial size was normal in size. Pericardium: There is no evidence of pericardial effusion. Mitral Valve: The mitral valve is normal in structure. Normal mobility of the mitral valve leaflets. Trivial mitral valve regurgitation. No evidence of mitral valve stenosis. Tricuspid Valve: The tricuspid valve is normal in structure. Tricuspid valve regurgitation is trivial. No evidence of tricuspid stenosis. Aortic Valve: The aortic valve is tricuspid. Aortic valve regurgitation is not visualized. Mild aortic valve sclerosis is present, with no  evidence of aortic valve stenosis. Pulmonic Valve: The pulmonic valve was not well visualized. Pulmonic valve regurgitation is not visualized. No evidence of pulmonic stenosis. Aorta: The aortic root is normal in size and structure. Venous: The inferior vena cava is normal in size with greater than 50% respiratory variability, suggesting right atrial pressure of 3 mmHg. IAS/Shunts: No atrial level shunt detected by color flow Doppler. Additional Comments: Normal LV systolic function; grade 1 diastolic dysfunction.  LEFT VENTRICLE PLAX 2D LVIDd:         4.40 cm      Diastology LVIDs:         3.40 cm      LV e' lateral:   7.07 cm/s  LV PW:         0.90 cm      LV E/e' lateral: 7.0 LV IVS:        0.90 cm      LV e' medial:    6.20 cm/s LVOT diam:     2.20 cm      LV E/e' medial:  8.0 LV SV:         77 LV SV Index:   41 LVOT Area:     3.80 cm  LV Volumes (MOD) LV vol d, MOD A4C: 121.0 ml LV vol s, MOD A4C: 55.0 ml LV SV MOD A4C:     121.0 ml RIGHT VENTRICLE RV S prime:     16.60 cm/s TAPSE (M-mode): 2.3 cm LEFT ATRIUM             Index       RIGHT ATRIUM           Index LA diam:        3.40 cm 1.80 cm/m  RA Area:     18.60 cm LA Vol (A2C):   59.4 ml 31.46 ml/m RA Volume:   51.20 ml  27.12 ml/m LA Vol (A4C):   24.6 ml 13.03 ml/m LA Biplane Vol: 40.0 ml 21.18 ml/m  AORTIC VALVE LVOT Vmax:   108.00 cm/s LVOT Vmean:  75.500 cm/s LVOT VTI:    0.202 m  AORTA Ao Root diam: 3.20 cm Ao Asc diam:  3.70 cm MITRAL VALVE MV Area (PHT): 3.37 cm    SHUNTS MV Decel Time: 225 msec    Systemic VTI:  0.20 m MV E velocity: 49.30 cm/s  Systemic Diam: 2.20 cm MV A velocity: 93.00 cm/s MV E/A ratio:  0.53 Kirk Ruths MD Electronically signed by Kirk Ruths MD Signature Date/Time: 09/04/2019/11:36:38 AM    Final     PHYSICAL EXAM  Temp:  [97.5 F (36.4 C)-99 F (37.2 C)] 98.2 F (36.8 C) (03/08 1204) Pulse Rate:  [84-117] 99 (03/08 1209) Resp:  [10-19] 18 (03/08 1204) BP: (136-200)/(75-121) 143/75 (03/08 1204) SpO2:  [94 %-100 %] 95 % (03/08 0518) Weight:  [70.7 kg] 70.7 kg (03/08 0812)  General - Well nourished, well developed, in no apparent distress.  Ophthalmologic - fundi not visualized due to noncooperation.  Cardiovascular - Regular rhythm and rate.  Mental Status -  Level of arousal and orientation to time, place, and person were intact. Language including expression, naming, repetition, comprehension was assessed and found intact. Fund of Knowledge was assessed and was intact.  Cranial Nerves II - XII - II - Visual field intact OU. III, IV, VI - Extraocular movements intact. V - Facial sensation intact bilaterally. VII  - Facial movement intact bilaterally. VIII - Hearing & vestibular intact bilaterally, no  nystagmus. X - Palate elevates symmetrically. XI - Chin turning & shoulder shrug intact bilaterally. XII - Tongue protrusion intact.  Motor Strength - The patient's strength was normal in all extremities and pronator drift was absent.  Bulk was normal and fasciculations were absent.   Motor Tone - Muscle tone was assessed at the neck and appendages and was normal.  Reflexes - The patient's reflexes were symmetrical in all extremities and he had no pathological reflexes.  Sensory - Light touch, temperature/pinprick were assessed and were symmetrical.    Coordination - The patient had normal movements in the hands and feet with no ataxia or dysmetria.  Tremor was absent.  Gait and Station - deferred.   ASSESSMENT/PLAN [name] is a 85 y.o. male with history of HTN, HLD, BPH, asthma, arthritis, hearing loss, and melanoma presenting with dizziness, nausea, headache, and near syncope.   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 Old L cerebellar infarcts. Small vessel disease. Atrophy.   MRI  R cerebellar PICA moderate sized infarct and R cerebellar vermis small infarct. Old L cerebellar infarcts. Small vessel disease. Atrophy. Intracranial L VA w/ possible high-grade occlusion.   CTA head and neck R VA origin severe stenosis. Portions R V2 high-grade stenosis or occlusion. Right PICA occluded. L V1 occlusion. L VA atherosclerosis w/ sites of severe stenosis. BA small w/ atherosclerosis irregularity and sites of moderate / moderate severe stenosis. L PCA small or severely stenotic.    2D Echo EF 55-60%. No source of embolus   LDL 124  HgbA1c 4.9  SCDs for VTE prophylaxis  No antithrombotic prior to admission, now on aspirin 325 mg daily and clopidogrel 75 mg daily. Given  severe intracranial atherosclerosis, continue DAPT x 3 months then aspirin alone.   Therapy recommendations:  HH OT  Disposition:  pending   Hypertension  Elevated on arrival 200/113  Stable now . Permissive hypertension (OK if < 220/120) for 24 hours but gradually towards goal in 3-5 days . Long-term BP goal 130-150 given large vessel stenosis  Hyperlipidemia  Home meds:  zocor 20  Now on lipitor 40  LDL 124, goal < 70  Continue statin at discharge  Other Stroke Risk Factors  Advanced age  ETOH use, advised to drink no more than 2 drink(s) a day  Family hx stroke (mother)  Other Active Problems  Mild intermittent asthma  Hypokalemia K 3.3 - supplement - 3.7  Leukocytosis WBC 14.5->10.2  Tachycardia - resolved  Hospital day # 0  Neurology will sign off. Please call with questions. Pt will follow up with stroke clinic NP at [facility] in about 4 weeks. Thanks for the consult.  Rosalin Hawking, MD PhD Stroke Neurology 09/04/2019 12:41 PM   To contact Stroke Continuity provider, please refer to [email or web address] After hours, contact General Neurology

## 2019-09-04 NOTE — Progress Notes (Signed)
Occupational Therapy Treatment Patient Details Name: Elijah [name] MRN: [id number] DOB: [birth year] Today's Date: 09/04/2019    History of present illness 85 y.o. 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    OT comments  Pt seen this pm for family education. Per conversation with nsg, pt was standing with his son to use the urinal when the son turned his head and not attending to his dad while bed alarm was going off. Pt started to lean toward left and nsg prevented fall. Son able to ambulate with pt, however, note L bias and required tactile cuing under L axilla in addition to visual cues to help maintain midline. Toward end of session, pt standing and complained of seeing things "sliding down the wall" and pt listing L. BP sitting 132/83; standing 111/86. Nsg and MD notified. Given level of assist and high risk for fall, feel pt is good candidate for short term inpt rehab to facilitate safe DC home. CIR CM contacted.   Follow Up Recommendations  Supervision/Assistance - 24 hour;CIR    Equipment Recommendations  3 in 1 bedside commode    Recommendations for Other Services Rehab consult    Precautions / Restrictions Precautions Precautions: Fall Precaution Comments: orthostatic       Mobility Bed Mobility Overal bed mobility: Needs Assistance Bed Mobility: Supine to Sit;Sit to Supine     Supine to sit: Supervision Sit to supine: Supervision      Transfers Overall transfer level: Needs assistance   Transfers: Sit to/from  Stand;Stand Pivot Transfers Sit to Stand: Min assist Stand pivot transfers: Min assist            Balance Overall balance assessment: Needs assistance   Sitting balance-Leahy Scale: Fair       Standing balance-Leahy Scale: Poor; L bias                             ADL either performed or assessed with clinical judgement   ADL                                       Functional mobility during ADLs: Minimal assistance;Rolling walker General ADL Comments: Educated son on home safety and reducing risk of falls and need for direct hands on assistance with all mobility and ADL. Discussed need to use urinal at night instead of attempting to walk to the bathroom to reduce risk of falls. Son walked with his Dad in hall using gait belt with input underneath L axilla to improve midline positioning when walking - using gait belt as well. Pt unable to stand for longer period of time - found to be orthostatic.      Vision       Perception     Praxis      Cognition Arousal/Alertness: Awake/alert Behavior During Therapy: WFL for tasks assessed/performed Overall Cognitive Status: Within Functional Limits for tasks assessed  General Comments: decreased insight into deficits; decreased safety awareness        Exercises  eye hand coordination activities   Shoulder Instructions       General Comments Discussed with nsg.Bed alarm was sounding earlier while son was in the room. Pt was standing to urinate and son was not beside his Dad adn nsg prevented fall.    Pertinent Vitals/ Pain       Pain Assessment: No/denies pain  Home Living                                          Prior Functioning/Environment              Frequency  Min 3X/week        Progress Toward Goals  OT Goals(current goals can now be found in the care plan section)  Progress towards OT goals: Progressing toward  goals  Acute Rehab OT Goals Patient Stated Goal: to go home OT Goal Formulation: With patient Time For Goal Achievement: 09/18/19 Potential to Achieve Goals: Good ADL Goals Pt Will Perform Lower Body Bathing: with modified independence;sit to/from stand Pt Will Perform Lower Body Dressing: with modified independence;sit to/from stand Pt Will Transfer to Toilet: with modified independence;ambulating Pt Will Perform Tub/Shower Transfer: Shower transfer;grab bars;ambulating;shower seat Additional ADL Goal #1: Pt will independently verbalize 3 strategies to reduce risk of falls.  Plan Discharge plan needs to be updated    Co-evaluation                 AM-PAC OT "6 Clicks" Daily Activity     Outcome Measure   Help from another person eating meals?: None Help from another person taking care of personal grooming?: A Little Help from another person toileting, which includes using toliet, bedpan, or urinal?: A Little Help from another person bathing (including washing, rinsing, drying)?: A Little Help from another person to put on and taking off regular upper body clothing?: A Little Help from another person to put on and taking off regular lower body clothing?: A Little 6 Click Score: 19    End of Session Equipment Utilized During Treatment: Gait belt;Rolling walker  OT Visit Diagnosis: Unsteadiness on feet (R26.81);Other abnormalities of gait and mobility (R26.89);Ataxia, unspecified (R27.0)   Activity Tolerance Treatment limited secondary to medical complications (Comment)(decrease in BP)   Patient Left in bed;with call bell/phone within reach;with bed alarm set;with family/visitor present   Nurse Communication Mobility status;Other (comment)(DC oncerns)        Time: 1530-1600 OT Time Calculation (min): 30 min  Charges: OT General Charges $OT Visit: 1 Visit OT Treatments $Self Care/Home Management : 23-37 mins  Maurie Boettcher, OT/L   Acute OT Clinical  Specialist [place] Pager [phone] Office [phone]    [facility] 09/04/2019, 4:49 PM

## 2019-09-04 NOTE — Discharge Instructions (Signed)
Ischemic Stroke    An ischemic stroke is the sudden death of brain tissue. Blood carries oxygen to all areas of the body. This type of stroke happens when your blood does not flow to your brain like normal. Your brain cannot get the oxygen it needs. This is an emergency. It must be treated right away.  Symptoms of a stroke usually happen all of a sudden. You may notice them when you wake up. They can include:  · Weakness or loss of feeling in your face, arm, or leg. This often happens on one side of the body.  · Trouble walking.  · Trouble moving your arms or legs.  · Loss of balance or coordination.  · Feeling confused.  · Trouble talking or understanding what people are saying.  · Slurred speech.  · Trouble seeing.  · Seeing two of one object (double vision).  · Feeling dizzy.  · Feeling sick to your stomach (nauseous) and throwing up (vomiting).  · A very bad headache for no reason.  Get help as soon as any of these problems start. This is important. Some treatments work better if they are given right away. These include:  · Aspirin.  · Medicines to control blood pressure.  · A shot (injection) of medicine to break up the blood clot.  · Treatments given in the blood vessel (artery) to take out the clot or break it up.  Other treatments may include:  · Oxygen.  · Fluids given through an IV tube.  · Medicines to thin out your blood.  · Procedures to help your blood flow better.  What increases the risk?  Certain things may make you more likely to have a stroke. Some of these are things that you can change, such as:  · Being very overweight (obesity).  · Smoking.  · Taking birth control pills.  · Not being active.  · Drinking too much alcohol.  · Using drugs.  Other risk factors include:  · High blood pressure.  · High cholesterol.  · Diabetes.  · Heart disease.  · Being African American, Native American, Hispanic, or [place] Native.  · Being over age 60.  · Family history of stroke.  · Having had blood clots,  stroke, or warning stroke (transient ischemic attack, TIA) in the past.  · Sickle cell disease.  · Being a woman with a history of high blood pressure in pregnancy (preeclampsia).  · Migraine headache.  · Sleep apnea.  · Having an irregular heartbeat (atrial fibrillation).  · Long-term (chronic) diseases that cause soreness and swelling (inflammation).  · Disorders that affect how your blood clots.  Follow these instructions at home:  Medicines  · Take over-the-counter and prescription medicines only as told by your doctor.  · If you were told to take aspirin or another medicine to thin your blood, take it exactly as told by your doctor.  ? Taking too much of the medicine can cause bleeding.  ? If you do not take enough, it may not work as well.  · Know the side effects of your medicines. If you are taking a blood thinner, make sure you:  ? Hold pressure over any cuts for longer than usual.  ? Tell your dentist and other doctors that you take this medicine.  ? Avoid activities that may cause damage or injury to your body.  Eating and drinking  · Follow instructions from your doctor about what you cannot eat or drink.  · Eat   This may include: ? Having experts look at your home to make sure it is safe. ? Putting grab bars in the bedroom and bathroom. ? Using raised toilets. ? Putting a seat in the shower. General instructions  Do not use any tobacco products. ? Examples of these are cigarettes, chewing tobacco, and e-cigarettes. ? If you need help quitting, ask your doctor.  Limit how much alcohol you drink. This means no more than 1 drink a day for nonpregnant women and 2 drinks  a day for men. One drink equals 12 oz of beer, 5 oz of wine, or 1 oz of hard liquor.  If you need help to stop using drugs or alcohol, ask your doctor to refer you to a program or specialist.  Stay active. Exercise as told by your doctor.  Keep all follow-up visits as told by your doctor. This is important. Get help right away if:   You have any signs of a stroke. "BE FAST" is an easy way to remember the main warning signs: ? B - Balance. Signs are dizziness, sudden trouble walking, or loss of balance. ? E - Eyes. Signs are trouble seeing or a change in how you see. ? F - Face. Signs are sudden weakness or loss of feeling of the face, or the face or eyelid drooping on one side. ? A - Arms. Signs are weakness or loss of feeling in an arm. This happens suddenly and usually on one side of the body. ? S - Speech. Signs are sudden trouble speaking, slurred speech, or trouble understanding what people say. ? T - Time. Time to call emergency services. Write down what time symptoms started.  You have other signs of a stroke, such as: ? A sudden, very bad headache with no known cause. ? Feeling sick to your stomach (nausea). ? Throwing up (vomiting). ? Jerky movements you cannot control (seizure). These symptoms may be an emergency. Do not wait to see if the symptoms will go away. Get medical help right away. Call your local emergency services (911 in the [place] Do not drive yourself to the hospital. Summary  An ischemic stroke is the sudden death of brain tissue.  Symptoms of a stroke usually happen all of a sudden. You may notice them when you wake up.  Get help if you have any warning signs of a stroke. This is important. Some treatments work better if they are given right away. This information is not intended to replace advice given to you by your health care provider. Make sure you discuss any questions you have with your health care provider. Document Revised: 11/24/2017 Document  Reviewed: 09/11/2015 Elsevier Patient Education  2020 Elsevier Inc.  

## 2019-09-04 NOTE — TOC Initial Note (Addendum)
Transition of Care [facility]) - Initial/Assessment Note    Patient Details  Name: Elijah [name] MRN: [id number] Date of Birth: 07/08/1934  [facility] CM/SW Contact:    Marilu Favre, RN Phone Number: 09/04/2019, 12:34 PM  Clinical Narrative:                 Confirmed face sheet information with patient at bedside. From home with wife. Son can provide transportation.   Provided Medicare.gov list. Patient would like some time to decide on choice of home health agency.   Patient has walker and 3 in 1 at home already from a relative.   Will follow up for home health agency decision.  Will need home health orders   Spoke with son [name] via phone. [name] is able to provide 24/7 assistance at home this week . He will be here at 2:30 to 3:00 to be shown how to help his father. Also discussed personal care services.   Spoke to son and patient at bedside. Son can provide 24/7 assistance at home this week and discussed personnel care agencies. Patient has decided on [facility] Referral given to [facility] with [facility].  Expected Discharge Plan: [place] Barriers to Discharge: Continued Medical Work up   Patient Goals and CMS Choice Patient states their goals for this hospitalization and ongoing recovery are:: to go home CMS Medicare.gov Compare Post Acute Care list provided to:: Patient Choice offered to / list presented to : Patient  Expected Discharge Plan and Services Expected Discharge Plan: [place]   Discharge Planning Services: CM Consult Post Acute Care Choice: [place] arrangements for the past 2 months: Single Family Home                 DME Arranged: N/A DME Agency: NA       HH Arranged: PT, OT          Prior Living Arrangements/Services Living arrangements for the past 2 months: Single Family Home Lives with:: Spouse Patient language and need for interpreter reviewed:: Yes Do you feel safe going back to the  place where you live?: Yes      Need for Family Participation in Patient Care: Yes (Comment) Care giver support system in place?: Yes (comment) Current home services: DME Criminal Activity/Legal Involvement Pertinent to Current Situation/Hospitalization: No - Comment as needed  Activities of Daily Living Home Assistive Devices/Equipment: None ADL Screening (condition at time of admission) Patient's cognitive ability adequate to safely complete daily activities?: Yes Is the patient deaf or have difficulty hearing?: Yes Does the patient have difficulty seeing, even when wearing glasses/contacts?: Yes Does the patient have difficulty concentrating, remembering, or making decisions?: No Patient able to express need for assistance with ADLs?: Yes Does the patient have difficulty dressing or bathing?: No Independently performs ADLs?: Yes (appropriate for developmental age) Does the patient have difficulty walking or climbing stairs?: No Weakness of Legs: None Weakness of Arms/Hands: None  Permission Sought/Granted   Permission granted to share information with : No              Emotional Assessment Appearance:: Appears stated age Attitude/Demeanor/Rapport: Engaged Affect (typically observed): Accepting Orientation: : Oriented to Self, Oriented to Place, Oriented to  Time, Oriented to Situation Alcohol / Substance Use: Not Applicable Psych Involvement: No (comment)  Admission diagnosis:  CVA (cerebral vascular accident) ([place]) [I63.9] Cerebrovascular accident (CVA), unspecified mechanism ([place]) [I63.9] Patient Active Problem List   Diagnosis Date  Noted  . CVA (cerebral vascular accident) ([place])   . Mild intermittent asthma   . Encounter for screening for malignant neoplasm of prostate  02/27/2019  . Need for influenza vaccination 02/27/2019  . BPH with elevated PSA 02/26/2019  . Age-related nuclear cataract, left eye 02/26/2019  . History of melanoma 12/06/2014  . Arthritis  02/19/2014  . Hyperglycemia 01/20/2011  . Vitamin D deficiency 11/04/2009  . HLD (hyperlipidemia) 11/04/2009  . Essential hypertension 11/04/2009  . ALLERGIC RHINITIS 11/04/2009  . ASTHMA 11/04/2009   PCP:  Lucille Passy, MD Pharmacy:   [id number] [address], [place] [address] Phone: [phone] Fax: [phone]     Social Determinants of Health (SDOH) Interventions    Readmission Risk Interventions No flowsheet data found.

## 2019-09-05 ENCOUNTER — Ambulatory Visit: Payer: Medicare Other | Attending: Internal Medicine

## 2019-09-05 DIAGNOSIS — Z23 Encounter for immunization: Secondary | ICD-10-CM | POA: Insufficient documentation

## 2019-09-05 DIAGNOSIS — I63541 Cerebral infarction due to unspecified occlusion or stenosis of right cerebellar artery: Secondary | ICD-10-CM

## 2019-09-05 LAB — BASIC METABOLIC PANEL
Anion gap: 13 (ref 5–15)
BUN: 15 mg/dL (ref 8–23)
CO2: 24 mmol/L (ref 22–32)
Calcium: 9.6 mg/dL (ref 8.9–10.3)
Chloride: 103 mmol/L (ref 98–111)
Creatinine, Ser: 1.17 mg/dL (ref 0.61–1.24)
GFR calc Af Amer: >60 mL/min (ref >60)
GFR calc non Af Amer: 58 mL/min — ABNORMAL LOW (ref >60)
Glucose, Bld: 117 mg/dL — ABNORMAL HIGH (ref 70–99)
Potassium: 3.7 mmol/L (ref 3.5–5.1)
Sodium: 140 mmol/L (ref 135–145)

## 2019-09-05 NOTE — Progress Notes (Signed)
Inpatient Rehabilitation Admissions Coordinator  Met with patient at bedside and discussed with PT and OT. Patient has progressed well and plans are for d/c home.  Barbara Boyette, RN, MSN Rehab Admissions Coordinator [phone] 09/05/2019 12:44 PM  

## 2019-09-05 NOTE — Progress Notes (Signed)
Physical Therapy Treatment Patient Details Name: Elijah [name] MRN: [id number] DOB: Mar 31, 1935 Today's Date: 09/05/2019    History of Present Illness 85 y.o. 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     PT Comments    Pt pleasant and motivated to participate in PT today. Pt ambulated great hallway distance and required no physical assist with use of RW. Pt does list L slightly, especially with directional changes, but pt is aware of this and is able to correct today. Pt scored a 15/24 on DGI, which indicates increased risk of falls. PT explained this to pt, and he states "I know, I have to be cautious. I will have a family member beside me at all times when I am mobilizing". PT feels pt is doing well, no complaints of dizziness this session.  PT feels pt is appropriate for HHPT level of care, which is his preferred d/c plan, especially with his supportive 24/7 care from family. PT to continue to follow while acute.    Follow Up Recommendations  Home health PT;Supervision/Assistance - 24 hour     Equipment Recommendations  None recommended by PT    Recommendations for Other Services       Precautions / Restrictions Precautions Precautions: Fall Restrictions Weight Bearing Restrictions: No    Mobility  Bed Mobility Overal bed mobility: Needs Assistance Bed Mobility: Supine to Sit;Sit to Supine     Supine to sit: Supervision Sit to supine: Supervision   General bed mobility comments: supervision for safety, increased time and  effort.  Transfers Overall transfer level: Needs assistance Equipment used: Rolling walker (2 wheeled) Transfers: Sit to/from Stand Sit to Stand: Supervision         General transfer comment: supervision for safety, pt with self-cuing hand placement when transitioning to standing.  Ambulation/Gait Ambulation/Gait assistance: Min guard Gait Distance (Feet): 200 Feet Assistive device: Rolling walker (2 wheeled) Gait Pattern/deviations: Step-through pattern;Decreased stride length;Staggering left Gait velocity: slightly decr Gait velocity interpretation: 1.31 - 2.62 ft/sec, indicative of limited community ambulator General Gait Details: For safety, verbal cuing for upright posture, slight L preference with drift L. Pt states "I know I go left, it just takes me a minute". No VC required for safety with RW use.   Stairs Stairs: Yes Stairs assistance: Min guard Stair Management: Alternating pattern;Two rails;Forwards Number of Stairs: 5 General stair comments: min guard for safety, step-over-step with increased time. No LOB   Wheelchair Mobility    Modified Rankin (Stroke Patients Only) Modified Rankin (Stroke Patients Only) Pre-Morbid Rankin Score: No symptoms Modified Rankin: Moderately severe disability     Balance Overall balance assessment: Needs assistance Sitting-balance support: No upper extremity supported;Feet supported Sitting balance-Leahy Scale: Fair     Standing balance support: During functional activity;Bilateral upper extremity supported Standing balance-Leahy Scale: Poor Standing balance comment: light use of RW in dynamic standing for steadying                 Standardized Balance Assessment Standardized Balance Assessment : Dynamic Gait Index   Dynamic Gait  Index Level Surface: Mild Impairment Change in Gait Speed: Mild Impairment Gait with Horizontal Head Turns: Mild Impairment Gait with Vertical Head Turns: Mild Impairment Gait and  Pivot Turn: Mild Impairment Step Over Obstacle: Moderate Impairment Step Around Obstacles: Mild Impairment Steps: Mild Impairment Total Score: 15      Cognition Arousal/Alertness: Awake/alert Behavior During Therapy: WFL for tasks assessed/performed Overall Cognitive Status: Within Functional Limits for tasks assessed                                 General Comments: aware of deficits his session, states "i know I get a little off balance, and lean towards the left. I will have family with me 24/7 at home, especially for mobility"      Exercises      General Comments        Pertinent Vitals/Pain Pain Assessment: No/denies pain    Home Living                      Prior Function            PT Goals (current goals can now be found in the care plan section) Acute Rehab PT Goals Patient Stated Goal: to go home PT Goal Formulation: With patient Time For Goal Achievement: 09/18/19 Potential to Achieve Goals: Good Progress towards PT goals: Progressing toward goals    Frequency    Min 4X/week      PT Plan Current plan remains appropriate    Co-evaluation              AM-PAC PT "6 Clicks" Mobility   Outcome Measure  Help needed turning from your back to your side while in a flat bed without using bedrails?: None Help needed moving from lying on your back to sitting on the side of a flat bed without using bedrails?: None Help needed moving to and from a bed to a chair (including a wheelchair)?: A Little Help needed standing up from a chair using your arms (e.g., wheelchair or bedside chair)?: A Little Help needed to walk in hospital room?: A Little Help needed climbing 3-5 steps with a railing? : A Little 6 Click Score: 20    End of Session Equipment Utilized During Treatment: Gait belt Activity Tolerance: Patient tolerated treatment well Patient left: with call bell/phone within reach;in bed;with bed alarm set Nurse Communication:  Mobility status PT Visit Diagnosis: Unsteadiness on feet (R26.81);Muscle weakness (generalized) (M62.81);Dizziness and giddiness (R42)     Time: [id number] PT Time Calculation (min) (ACUTE ONLY): 17 min  Charges:  $Gait Training: 8-22 mins                     Montay Vanvoorhis E, PT [place] Pager [phone]  Office [phone]  Mayce Noyes D Ligaya Cormier 09/05/2019, 11:27 AM

## 2019-09-05 NOTE — Progress Notes (Signed)
RN gave pt discharge instructions and pt stated understanding, 3 new mediations escribed to pt home pharmacy for pickup. Pt IV has been removed he has called his son to pick him up, son will be here at 1300. Pt belongings have been packed his son is bringing his clothes.

## 2019-09-05 NOTE — Progress Notes (Signed)
SLP Cancellation Note  Patient Details Name: Andrez Spiva MRN: [id number] DOB: April 04, 1935   Cancelled treatment:       Reason Eval/Treat Not Completed: SLP screened, no needs identified, will sign off   Sadiq Mccauley, Katherene Ponto 09/05/2019, 1:01 PM

## 2019-09-05 NOTE — Progress Notes (Signed)
   Covid-19 Vaccination Clinic  Name:  Elijah [name]    MRN: [id number] DOB: [birth year]  09/05/2019  Elijah [name] was observed post Covid-19 immunization for 15 minutes without incident. He was provided with Vaccine Information Sheet and instruction to access the V-Safe system.   Elijah [name] was instructed to call 911 with any severe reactions post vaccine: Marland Kitchen Difficulty breathing  . Swelling of face and throat  . A fast heartbeat  . A bad rash all over body  . Dizziness and weakness   Immunizations Administered    Name Date Dose VIS Date Route   Pfizer COVID-19 Vaccine 09/05/2019  5:37 PM 0.3 mL 06/09/2019 Intramuscular   Manufacturer: [place]   Lot: [id number]   [place]: [id number]

## 2019-09-05 NOTE — Discharge Summary (Signed)
Physician Discharge Summary  Elijah [name] [id number] DOB: 1934-10-29 DOA: 09/03/2019  PCP: Lucille Passy, MD  Admit date: 09/03/2019 Discharge date: 09/05/2019  Admitted From: Home Disposition: Home  Recommendations for Outpatient Follow-up:  1. Follow up with PCP in 1-2 weeks 2. Follow with neurology in 4 weeks 3. Please hold your antihypertensives for 2 days and resume both amlodipine and Accupril on 09/06/2019 to allow permissive hypertension for next 2 days. 4. Please obtain BMP/CBC in one week 5. Please follow up on the following pending results:  Home Health: Yes Equipment/Devices: Walker and commode, patient already has them  Discharge Condition: Stable CODE STATUS: Full code Diet recommendation: Cardiac  Subjective: Seen and examined.  No complaints other than mild dizziness.  HPI: Elijah [name] is a 85 y.o. male with medical history significant of hypertension, hyperlipidemia, asthma, arthritis presents to emergency department due to dizziness, diaphoresis.  Patient tells me that he just got home from church this afternoon and was trying to undress himself and suddenly he became dizzy, he described as room is spinning around him, unable to open his eyes and had to lay down on the floor.  Reports nausea and headache as well.   No history of head trauma, seizures, loss of consciousness, urinary or bowel incontinence, previous history of stroke, blurry vision, chest pain, shortness of breath, palpitation, leg swelling, fever, chills, cough, congestion, dysuria, diarrhea, vomiting, decreased appetite, generalized weakness or lethargy.  He lives with his wife, no history of smoking, illicit drug use however drinks whiskey every day.  He is fairly active and exercise every day.  ED Course: Upon arrival: Patient's blood pressure was noted to be on higher side.  Initial labs such as UA came back negative, CBC: WNL, CMP shows potassium of 3.3.  MRI, CT angiogram of head and neck  concerning for right cerebellar infarction.  EDP consulted neurology for further evaluation and management.  [facility] hospitalist consulted for the admission.  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  Patient was started on aspirin and Plavix.  Neurology was consulted.  He was seen by PT OT and they recommended home health, walker and bedside commode and 24-hour supervision.  He already has walker and commode.  Home health was ordered for patient.  Patient was cleared by neurology today with instructions to continue aspirin 325 mg along with Plavix 75 mg p.o. daily for 3 months followed by aspirin 325 mg daily.  No further intervention was recommended.  I personally spoke to patient's son Elijah [name] who is available to provide 24-hour care and is agreeable with discharge plan.  He will follow with PCP within 1 week and neurology in 4 weeks.  His simvastatin is being switched to atorvastatin.  His antihypertensives are being held for next 2 days to allow permissive hypertension as despite of holding his antihypertensives here, his blood pressure is in upper normal range.  Discharge Diagnoses:  Principal Problem:   CVA (cerebral vascular accident) ([place]) Active Problems:   HLD (hyperlipidemia)   Essential hypertension   Mild intermittent asthma   Stroke [facility])    Discharge Instructions  Discharge Instructions    Ambulatory referral to Neurology   Complete by: As directed    Follow up with stroke clinic NP (Jessica Vanschaick or Cecille Rubin, if both not available, consider Zachery Dauer, or Ahern) at [facility] in about 4 weeks. Thanks.   Discharge patient  Complete by: As directed    Discharge disposition: 06-Home-Health Care Svc   Discharge patient date: 09/05/2019     Allergies as of 09/05/2019      Reactions   Penicillins Hives      Medication List     STOP taking these medications   simvastatin 20 MG tablet Commonly known as: ZOCOR     TAKE these medications   albuterol 108 (90 Base) MCG/ACT inhaler Commonly known as: VENTOLIN HFA Inhale 1-2 puffs into the lungs every 4 (four) hours as needed for wheezing or shortness of breath.   amLODipine 5 MG tablet Commonly known as: NORVASC Take 1 tablet (5 mg total) by mouth daily. Start taking on: September 06, 2019   aspirin 325 MG tablet Take 1 tablet (325 mg total) by mouth daily.   atorvastatin 40 MG tablet Commonly known as: LIPITOR Take 1 tablet (40 mg total) by mouth daily at 6 PM.   cholecalciferol 25 MCG (1000 UNIT) tablet Commonly known as: VITAMIN D3 Take 2,000 Units by mouth daily.   clopidogrel 75 MG tablet Commonly known as: PLAVIX Take 1 tablet (75 mg total) by mouth daily.   fluticasone 50 MCG/ACT nasal spray Commonly known as: FLONASE USE 2 SPRAYS IN EACH NOSTRIL DURING ALLERGY SEASON.   pseudoephedrine 30 MG tablet Commonly known as: SUDAFED Take 30 mg by mouth as needed.   quinapril 10 MG tablet Commonly known as: ACCUPRIL Take 1 tablet (10 mg total) by mouth daily. TAKE 1 TABLET BY MOUTH EVERY DAY Start taking on: September 06, 2019   [facility] Calcium Carbonate-Vitamin D 600-400 MG-UNIT tablet Generic drug: Calcium Carbonate-Vitamin D Take 1 tablet by mouth daily.      Follow-up Information    [facility]. Schedule an appointment as soon as possible for a visit in 4 week(s).   Specialty: Neurology Contact information: [address] [place] [phone]       Lucille Passy, MD Follow up in 1 week(s).   Specialty: Family Medicine       Care, [facility] Follow up.   Specialty: Home Health Services Why: provide home health  Contact information: [address] [place] [address] [phone]          Allergies  Allergen Reactions  . Penicillins Hives     Consultations: Neurology   Procedures/Studies: CT Angio Head W or Wo Contrast  Result Date: 09/03/2019 CLINICAL DATA:  Carotid artery stenosis. Near syncopal episode today. Dizziness. EXAM: CT ANGIOGRAPHY HEAD AND NECK TECHNIQUE: Multidetector CT imaging of the head and neck was performed using the standard protocol during bolus administration of intravenous contrast. Multiplanar CT image reconstructions and MIPs were obtained to evaluate the vascular anatomy. Carotid stenosis measurements (when applicable) are obtained utilizing NASCET criteria, using the distal internal carotid diameter as the denominator. CONTRAST:  52mL OMNIPAQUE IOHEXOL 350 MG/ML SOLN COMPARISON:  Brain MRI performed earlier the same day 09/03/2019. FINDINGS: CT HEAD FINDINGS Brain: There is no evidence of acute intracranial hemorrhage, intracranial mass, midline shift or extra-axial fluid collection.No demarcated cortical infarction. A moderate-sized acute infarct within the right cerebellum in the right PICA vascular territory was better appreciated on same-day brain MRI. Redemonstrated chronic infarcts within the left cerebellum. Background mild generalized parenchymal atrophy and chronic small vessel ischemic disease. Vascular: Reported separately. Skull: Normal. Negative for fracture or focal lesion. Sinuses: No significant paranasal sinus disease or mastoid effusion. Orbits: Visualized orbits demonstrate no acute abnormality. Review  of the MIP images confirms the above findings CTA NECK FINDINGS Aortic arch: Standard aortic branching. Calcified plaque within the visualized aortic arch and proximal major branch vessels of the neck. No significant innominate or proximal subclavian artery stenosis. Right carotid system: CCA and ICA patent within the neck without significant stenosis (50% or greater). Mild mixed plaque within the proximal ICA. Left carotid system: CCA and ICA patent within the neck without significant stenosis (50%  or greater). Minimal mixed plaque within the proximal ICA. Vertebral arteries: Predominantly soft plaque results in severe stenosis at the origin of the right vertebral artery. Portions of the proximal V2 right vertebral artery appears severely stenosed or occluded (for instance as seen on series 11, image 237). More distally, the V2 and V3 right vertebral artery is patent. The V1 left vertebral artery appears occluded. There is reconstitution of flow within the V2 and left vertebral artery, although with a diminutive appearance of this vessel. Skeleton: No acute bony abnormality cervical spondylosis with multilevel disc height loss, posterior disc osteophytes, uncovertebral and facet hypertrophy. Other neck: No neck mass or cervical lymphadenopathy. Subcentimeter thyroid nodules not meeting consensus criteria for ultrasound follow-up. Upper chest: No consolidation within the imaged lung apices. Review of the MIP images confirms the above findings CTA HEAD FINDINGS Anterior circulation: The intracranial internal carotid arteries are patent with scattered calcified plaque. No significant stenosis in these vessels. The M1 middle cerebral arteries are patent without significant stenosis. No M2 proximal branch occlusion or high-grade proximal stenosis is identified. The anterior cerebral arteries are patent. Moderate focal stenosis within a proximal to mid A2 right anterior cerebral artery branch. No intracranial aneurysm is identified. Posterior circulation: The intracranial right vertebral artery is markedly diminutive beyond the origin of the right PICA but appears patent. There is prominent atherosclerotic irregularity of the intracranial left vertebral artery with sites of severe stenosis. There is a paucity of flow within the right PICA shortly beyond its origin. The basilar artery is diminutive with multifocal atherosclerotic irregularity and sites of moderate to moderately severe stenosis. There are sizable  bilateral posterior communicating arteries. The P1 left posterior cerebral artery is highly stenotic or developmentally diminutive. Otherwise, no significant proximal stenosis within these vessels. Venous sinuses: Within limitations of contrast timing, no convincing thrombus. Anatomic variants: As described. Review of the MIP images confirms the above findings These results were called by telephone at the time of interpretation on 09/03/2019 at 5:57 pm to provider DAVID YAO , who verbally acknowledged these results. IMPRESSION: CT head: 1. A moderate-sized acute right PICA vascular territory cerebellar infarct was better appreciated on same-day brain MRI. 2. Redemonstrated chronic infarcts within the left cerebellum. 3. Background mild generalized parenchymal atrophy and chronic small vessel ischemic disease. CTA neck: 1. The bilateral common and internal carotid arteries are patent within the neck without significant stenosis. Mild atherosclerotic disease within the proximal ICAs. 2. Severe stenosis at the origin of the right vertebral artery. Portions of the proximal V2 right vertebral artery appear highly stenosed or occluded. There is reconstitution of flow within the more distal V2 and V3 segments. 3. The V1 left vertebral artery is occluded. There is reconstitution of flow within the V2 and V3 segments, although with an irregular and diminutive appearance of this vessel. CTA head: 1. The intracranial right vertebral artery is markedly diminutive beyond the origin of the right PICA, although patent. There is a paucity of flow within the right PICA shortly beyond its origin and this vessel is likely  occluded given findings on same-day brain MRI. 2. Prominent atherosclerotic irregularity of the intracranial left vertebral artery with sites of severe stenosis. 3. The basilar artery is diminutive with atherosclerotic irregularity and sites of up to moderate/moderately severe stenosis. 4. Sizable bilateral posterior  communicating arteries. he P1 left PCA is either developmentally diminutive or severely stenotic. Electronically Signed   By: Kellie Simmering DO   On: 09/03/2019 17:55   CT Angio Neck W and/or Wo Contrast  Result Date: 09/03/2019 CLINICAL DATA:  Carotid artery stenosis. Near syncopal episode today. Dizziness. EXAM: CT ANGIOGRAPHY HEAD AND NECK TECHNIQUE: Multidetector CT imaging of the head and neck was performed using the standard protocol during bolus administration of intravenous contrast. Multiplanar CT image reconstructions and MIPs were obtained to evaluate the vascular anatomy. Carotid stenosis measurements (when applicable) are obtained utilizing NASCET criteria, using the distal internal carotid diameter as the denominator. CONTRAST:  29mL OMNIPAQUE IOHEXOL 350 MG/ML SOLN COMPARISON:  Brain MRI performed earlier the same day 09/03/2019. FINDINGS: CT HEAD FINDINGS Brain: There is no evidence of acute intracranial hemorrhage, intracranial mass, midline shift or extra-axial fluid collection.No demarcated cortical infarction. A moderate-sized acute infarct within the right cerebellum in the right PICA vascular territory was better appreciated on same-day brain MRI. Redemonstrated chronic infarcts within the left cerebellum. Background mild generalized parenchymal atrophy and chronic small vessel ischemic disease. Vascular: Reported separately. Skull: Normal. Negative for fracture or focal lesion. Sinuses: No significant paranasal sinus disease or mastoid effusion. Orbits: Visualized orbits demonstrate no acute abnormality. Review of the MIP images confirms the above findings CTA NECK FINDINGS Aortic arch: Standard aortic branching. Calcified plaque within the visualized aortic arch and proximal major branch vessels of the neck. No significant innominate or proximal subclavian artery stenosis. Right carotid system: CCA and ICA patent within the neck without significant stenosis (50% or greater). Mild mixed  plaque within the proximal ICA. Left carotid system: CCA and ICA patent within the neck without significant stenosis (50% or greater). Minimal mixed plaque within the proximal ICA. Vertebral arteries: Predominantly soft plaque results in severe stenosis at the origin of the right vertebral artery. Portions of the proximal V2 right vertebral artery appears severely stenosed or occluded (for instance as seen on series 11, image 237). More distally, the V2 and V3 right vertebral artery is patent. The V1 left vertebral artery appears occluded. There is reconstitution of flow within the V2 and left vertebral artery, although with a diminutive appearance of this vessel. Skeleton: No acute bony abnormality cervical spondylosis with multilevel disc height loss, posterior disc osteophytes, uncovertebral and facet hypertrophy. Other neck: No neck mass or cervical lymphadenopathy. Subcentimeter thyroid nodules not meeting consensus criteria for ultrasound follow-up. Upper chest: No consolidation within the imaged lung apices. Review of the MIP images confirms the above findings CTA HEAD FINDINGS Anterior circulation: The intracranial internal carotid arteries are patent with scattered calcified plaque. No significant stenosis in these vessels. The M1 middle cerebral arteries are patent without significant stenosis. No M2 proximal branch occlusion or high-grade proximal stenosis is identified. The anterior cerebral arteries are patent. Moderate focal stenosis within a proximal to mid A2 right anterior cerebral artery branch. No intracranial aneurysm is identified. Posterior circulation: The intracranial right vertebral artery is markedly diminutive beyond the origin of the right PICA but appears patent. There is prominent atherosclerotic irregularity of the intracranial left vertebral artery with sites of severe stenosis. There is a paucity of flow within the right PICA shortly beyond its origin. The  basilar artery is  diminutive with multifocal atherosclerotic irregularity and sites of moderate to moderately severe stenosis. There are sizable bilateral posterior communicating arteries. The P1 left posterior cerebral artery is highly stenotic or developmentally diminutive. Otherwise, no significant proximal stenosis within these vessels. Venous sinuses: Within limitations of contrast timing, no convincing thrombus. Anatomic variants: As described. Review of the MIP images confirms the above findings These results were called by telephone at the time of interpretation on 09/03/2019 at 5:57 pm to provider DAVID YAO , who verbally acknowledged these results. IMPRESSION: CT head: 1. A moderate-sized acute right PICA vascular territory cerebellar infarct was better appreciated on same-day brain MRI. 2. Redemonstrated chronic infarcts within the left cerebellum. 3. Background mild generalized parenchymal atrophy and chronic small vessel ischemic disease. CTA neck: 1. The bilateral common and internal carotid arteries are patent within the neck without significant stenosis. Mild atherosclerotic disease within the proximal ICAs. 2. Severe stenosis at the origin of the right vertebral artery. Portions of the proximal V2 right vertebral artery appear highly stenosed or occluded. There is reconstitution of flow within the more distal V2 and V3 segments. 3. The V1 left vertebral artery is occluded. There is reconstitution of flow within the V2 and V3 segments, although with an irregular and diminutive appearance of this vessel. CTA head: 1. The intracranial right vertebral artery is markedly diminutive beyond the origin of the right PICA, although patent. There is a paucity of flow within the right PICA shortly beyond its origin and this vessel is likely occluded given findings on same-day brain MRI. 2. Prominent atherosclerotic irregularity of the intracranial left vertebral artery with sites of severe stenosis. 3. The basilar artery is  diminutive with atherosclerotic irregularity and sites of up to moderate/moderately severe stenosis. 4. Sizable bilateral posterior communicating arteries. he P1 left PCA is either developmentally diminutive or severely stenotic. Electronically Signed   By: Kellie Simmering DO   On: 09/03/2019 17:55   MR BRAIN WO CONTRAST  Addendum Date: 09/03/2019   ADDENDUM REPORT: 09/03/2019 17:18 ADDENDUM: These results were called by telephone at the time of interpretation on 09/03/2019 at 5:18 pm to provider DAVID YAO , who verbally acknowledged these results. Electronically Signed   By: Kellie Simmering DO   On: 09/03/2019 17:18   Result Date: 09/03/2019 CLINICAL DATA:  Dizziness, rule out stroke; headache, intracranial hemorrhage suspected. EXAM: MRI HEAD WITHOUT CONTRAST TECHNIQUE: Multiplanar, multiecho pulse sequences of the brain and surrounding structures were obtained without intravenous contrast. COMPARISON:  No pertinent prior studies available for comparison. FINDINGS: Brain: There is a moderate-sized focus of restricted diffusion within the right cerebellum in the right PICA vascular territory consistent with acute infarction. There is an additional punctate focus of acute infarction within the superior right cerebellum (series 5, image 60). No evidence of intracranial mass. No midline shift or extra-axial fluid collection. No chronic intracranial blood products. Chronic left cerebellar infarcts. Minimal scattered T2/FLAIR hyperintensity within the cerebral white matter is nonspecific, but consistent with chronic small vessel ischemic disease. Moderate generalized parenchymal atrophy. Vascular: Abnormal appearance of the intracranial left vertebral artery which may reflect high-grade stenosis or occlusion. Flow voids otherwise maintained within the proximal large arterial vessels. Skull and upper cervical spine: No focal marrow lesion. Incompletely assessed upper cervical spondylosis. Sinuses/Orbits: Prior right lens  replacement. No significant paranasal sinus disease or mastoid effusion IMPRESSION: Moderate-sized acute infarct within the right cerebellum in the right PICA vascular territory. Additional punctate acute infarct more superiorly within the right  cerebellum. Chronic left cerebellar infarcts. Background moderate generalized parenchymal atrophy with mild chronic small vessel ischemic disease. Abnormal appearance of the intracranial left vertebral artery which may reflect high-grade stenosis or occlusion. Electronically Signed: By: Kellie Simmering DO On: 09/03/2019 17:11   ECHOCARDIOGRAM COMPLETE  Result Date: 09/04/2019    ECHOCARDIOGRAM REPORT   Patient Name:   Elijah [name] Date of Exam: 09/04/2019 Medical Rec #:  [id number]      Height:       68.0 in Accession #:    [id number]     Weight:       166.0 lb Date of Birth:  25-Jul-1934       BSA:          1.888 m Patient Age:    74 years       BP:           139/82 mmHg Patient Gender: M              HR:           74 bpm. Exam Location:  Inpatient Procedure: 2D Echo, Color Doppler and Cardiac Doppler Indications:    Stroke i163.9  History:        Patient has no prior history of Echocardiogram examinations.                 Risk Factors:Hypertension and Dyslipidemia.  Sonographer:    Raquel Sarna Senior RDCS Referring Phys: [id number] [place]  1. Normal LV systolic function; grade 1 diastolic dysfunction.  2. Left ventricular ejection fraction, by estimation, is 55 to 60%. The left ventricle has normal function. The left ventricle has no regional wall motion abnormalities. Left ventricular diastolic parameters are consistent with Grade I diastolic dysfunction (impaired relaxation).  3. Right ventricular systolic function is normal. The right ventricular size is normal.  4. The mitral valve is normal in structure. Trivial mitral valve regurgitation. No evidence of mitral stenosis.  5. The aortic valve is tricuspid. Aortic valve regurgitation is not visualized. Mild  aortic valve sclerosis is present, with no evidence of aortic valve stenosis.  6. The inferior vena cava is normal in size with greater than 50% respiratory variability, suggesting right atrial pressure of 3 mmHg. FINDINGS  Left Ventricle: Left ventricular ejection fraction, by estimation, is 55 to 60%. The left ventricle has normal function. The left ventricle has no regional wall motion abnormalities. The left ventricular internal cavity size was normal in size. There is  no left ventricular hypertrophy. Left ventricular diastolic parameters are consistent with Grade I diastolic dysfunction (impaired relaxation). Right Ventricle: The right ventricular size is normal. Right ventricular systolic function is normal. Left Atrium: Left atrial size was normal in size. Right Atrium: Right atrial size was normal in size. Pericardium: There is no evidence of pericardial effusion. Mitral Valve: The mitral valve is normal in structure. Normal mobility of the mitral valve leaflets. Trivial mitral valve regurgitation. No evidence of mitral valve stenosis. Tricuspid Valve: The tricuspid valve is normal in structure. Tricuspid valve regurgitation is trivial. No evidence of tricuspid stenosis. Aortic Valve: The aortic valve is tricuspid. Aortic valve regurgitation is not visualized. Mild aortic valve sclerosis is present, with no evidence of aortic valve stenosis. Pulmonic Valve: The pulmonic valve was not well visualized. Pulmonic valve regurgitation is not visualized. No evidence of pulmonic stenosis. Aorta: The aortic root is normal in size and structure. Venous: The inferior vena cava is normal in size with greater than 50% respiratory variability,  suggesting right atrial pressure of 3 mmHg. IAS/Shunts: No atrial level shunt detected by color flow Doppler. Additional Comments: Normal LV systolic function; grade 1 diastolic dysfunction.  LEFT VENTRICLE PLAX 2D LVIDd:         4.40 cm      Diastology LVIDs:         3.40 cm       LV e' lateral:   7.07 cm/s LV PW:         0.90 cm      LV E/e' lateral: 7.0 LV IVS:        0.90 cm      LV e' medial:    6.20 cm/s LVOT diam:     2.20 cm      LV E/e' medial:  8.0 LV SV:         77 LV SV Index:   41 LVOT Area:     3.80 cm  LV Volumes (MOD) LV vol d, MOD A4C: 121.0 ml LV vol s, MOD A4C: 55.0 ml LV SV MOD A4C:     121.0 ml RIGHT VENTRICLE RV S prime:     16.60 cm/s TAPSE (M-mode): 2.3 cm LEFT ATRIUM             Index       RIGHT ATRIUM           Index LA diam:        3.40 cm 1.80 cm/m  RA Area:     18.60 cm LA Vol (A2C):   59.4 ml 31.46 ml/m RA Volume:   51.20 ml  27.12 ml/m LA Vol (A4C):   24.6 ml 13.03 ml/m LA Biplane Vol: 40.0 ml 21.18 ml/m  AORTIC VALVE LVOT Vmax:   108.00 cm/s LVOT Vmean:  75.500 cm/s LVOT VTI:    0.202 m  AORTA Ao Root diam: 3.20 cm Ao Asc diam:  3.70 cm MITRAL VALVE MV Area (PHT): 3.37 cm    SHUNTS MV Decel Time: 225 msec    Systemic VTI:  0.20 m MV E velocity: 49.30 cm/s  Systemic Diam: 2.20 cm MV A velocity: 93.00 cm/s MV E/A ratio:  0.53 Kirk Ruths MD Electronically signed by Kirk Ruths MD Signature Date/Time: 09/04/2019/11:36:38 AM    Final      Discharge Exam: Vitals:   09/05/19 0101 09/05/19 0502  BP: (!) 137/95 (!) 147/97  Pulse: 95 92  Resp: 16 16  Temp: 99 F (37.2 C) 98.2 F (36.8 C)  SpO2: 97% 97%   Vitals:   09/04/19 1209 09/04/19 1800 09/05/19 0101 09/05/19 0502  BP:  (!) 144/90 (!) 137/95 (!) 147/97  Pulse: 99 90 95 92  Resp:  16 16 16   Temp:  98.4 F (36.9 C) 99 F (37.2 C) 98.2 F (36.8 C)  TempSrc:  Oral Oral Oral  SpO2:  100% 97% 97%  Weight:      Height:        General: Pt is alert, awake, not in acute distress Cardiovascular: RRR, S1/S2 +, no rubs, no gallops Respiratory: CTA bilaterally, no wheezing, no rhonchi Abdominal: Soft, NT, ND, bowel sounds + Extremities: no edema, no cyanosis    The results of significant diagnostics from this hospitalization (including imaging, microbiology, ancillary and  laboratory) are listed below for reference.     Microbiology: Recent Results (from the past 240 hour(s))  SARS CORONAVIRUS 2 (TAT 6-24 HRS) Nasopharyngeal Nasopharyngeal Swab     Status: None   Collection Time: 09/03/19  6:05 PM   Specimen: Nasopharyngeal Swab  Result Value Ref Range Status   SARS Coronavirus 2 NEGATIVE NEGATIVE Final    Comment: (NOTE) SARS-CoV-2 target nucleic acids are NOT DETECTED. The SARS-CoV-2 RNA is generally detectable in upper and lower respiratory specimens during the acute phase of infection. Negative results do not preclude SARS-CoV-2 infection, do not rule out co-infections with other pathogens, and should not be used as the sole basis for treatment or other patient management decisions. Negative results must be combined with clinical observations, patient history, and epidemiological information. The expected result is Negative. Fact Sheet for Patients: SugarRoll.be Fact Sheet for Healthcare Providers: https://www.woods-mathews.com/ This test is not yet approved or cleared by the [place] FDA and  has been authorized for detection and/or diagnosis of SARS-CoV-2 by FDA under an Emergency Use Authorization (EUA). This EUA will remain  in effect (meaning this test can be used) for the duration of the COVID-19 declaration under Section 56 4(b)(1) of the Act, 21 U.S.C. section 360bbb-3(b)(1), unless the authorization is terminated or revoked sooner. Performed at [facility], [place] [address], [place], [place] [address]      Labs: BNP (last 3 results) No results for input(s): BNP in the last 8760 hours. Basic Metabolic Panel: Recent Labs  Lab 09/03/19 1508 09/03/19 1914 09/04/19 0852 09/05/19 0813  NA 140  --  140 140  K 3.3*  --  3.7 3.7  CL 106  --  108 103  CO2 21*  --  22 24  GLUCOSE 138*  --  117* 117*  BUN 15  --  9 15  CREATININE 1.12 0.98 1.06 1.17  CALCIUM 9.6  --  9.4 9.6    Liver Function Tests: Recent Labs  Lab 09/03/19 1508  AST 24  ALT 17  ALKPHOS 67  BILITOT 1.6*  PROT 7.6  ALBUMIN 4.4   No results for input(s): LIPASE, AMYLASE in the last 168 hours. No results for input(s): AMMONIA in the last 168 hours. CBC: Recent Labs  Lab 09/03/19 1508 09/03/19 1917 09/04/19 0852  WBC 10.3 14.5* 10.2  NEUTROABS 8.5*  --   --   HGB 17.7* 17.7* 16.5  HCT 48.3 48.0 45.6  MCV 89.1 89.1 89.8  PLT 220 215 210   Cardiac Enzymes: No results for input(s): CKTOTAL, CKMB, CKMBINDEX, TROPONINI in the last 168 hours. BNP: Invalid input(s): POCBNP CBG: No results for input(s): GLUCAP in the last 168 hours. D-Dimer No results for input(s): DDIMER in the last 72 hours. Hgb A1c Recent Labs    09/03/19 1508  HGBA1C 4.9   Lipid Profile Recent Labs    09/03/19 1508  CHOL 216*  HDL 74  LDLCALC 124*  TRIG 89  CHOLHDL 2.9   Thyroid function studies No results for input(s): TSH, T4TOTAL, T3FREE, THYROIDAB in the last 72 hours.  Invalid input(s): FREET3 Anemia work up No results for input(s): VITAMINB12, FOLATE, FERRITIN, TIBC, IRON, RETICCTPCT in the last 72 hours. Urinalysis    Component Value Date/Time   COLORURINE YELLOW 09/03/2019 1458   APPEARANCEUR CLEAR 09/03/2019 1458   LABSPEC 1.011 09/03/2019 1458   PHURINE 7.0 09/03/2019 1458   GLUCOSEU NEGATIVE 09/03/2019 1458   HGBUR NEGATIVE 09/03/2019 1458   BILIRUBINUR NEGATIVE 09/03/2019 1458   KETONESUR 5 (A) 09/03/2019 1458   PROTEINUR NEGATIVE 09/03/2019 1458   NITRITE NEGATIVE 09/03/2019 1458   LEUKOCYTESUR NEGATIVE 09/03/2019 1458   Sepsis Labs Invalid input(s): PROCALCITONIN,  WBC,  LACTICIDVEN Microbiology Recent Results (from  the past 240 hour(s))  SARS CORONAVIRUS 2 (TAT 6-24 HRS) Nasopharyngeal Nasopharyngeal Swab     Status: None   Collection Time: 09/03/19  6:05 PM   Specimen: Nasopharyngeal Swab  Result Value Ref Range Status   SARS Coronavirus 2 NEGATIVE NEGATIVE Final     Comment: (NOTE) SARS-CoV-2 target nucleic acids are NOT DETECTED. The SARS-CoV-2 RNA is generally detectable in upper and lower respiratory specimens during the acute phase of infection. Negative results do not preclude SARS-CoV-2 infection, do not rule out co-infections with other pathogens, and should not be used as the sole basis for treatment or other patient management decisions. Negative results must be combined with clinical observations, patient history, and epidemiological information. The expected result is Negative. Fact Sheet for Patients: SugarRoll.be Fact Sheet for Healthcare Providers: https://www.woods-mathews.com/ This test is not yet approved or cleared by the [place] FDA and  has been authorized for detection and/or diagnosis of SARS-CoV-2 by FDA under an Emergency Use Authorization (EUA). This EUA will remain  in effect (meaning this test can be used) for the duration of the COVID-19 declaration under Section 56 4(b)(1) of the Act, 21 U.S.C. section 360bbb-3(b)(1), unless the authorization is terminated or revoked sooner. Performed at [facility], [place] [address] [place], [place] [address]      Time coordinating discharge: Over 30 minutes  SIGNED:   Darliss Cheney, MD  [facility] 09/05/2019, 11:09 AM  If 7PM-7AM, please contact night-coverage [email or web address]  Addendum: Patient was initially discharged yesterday early afternoon however in the left afternoon, when seen by OT, patient was more dizzy and was leaning towards left and was high fall risk and thus therapies recommended inpatient rehab and for that reason, his discharge was canceled yesterday on 09/04/2019.  He was evaluated by inpatient rehab and there was a plan to discharge him to inpatient rehab instead of home with home health however when seen this morning on 09/05/2019, patient states that he has improved significantly he does not have any  more dizziness.  He has been walking by himself in the room without having any issues.  He was reevaluated by PT OT again today and they have cleared him to go home.  Please read discharge summary above for details.

## 2019-09-05 NOTE — Progress Notes (Addendum)
Occupational Therapy Treatment Patient Details Name: Elijah [name] MRN: [id number] DOB: [birth year] Today's Date: 09/05/2019    History of present illness 85 y.o. 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    OT comments  Pt making steady progress towards OT goals, presents supine in bed pleasant and eager to work with therapies. Pt continues to present with intermittent lateral imbalances towards the L with mobility and tends to list left with static standing ADL tasks. Overall pt requiring close minguard-minA for room level mobility and standing grooming ADL using RW, minA for LB ADL.BP monitored and stable today, 115/93 seated EOB and 110/93 with initial stand (pt denies dizziness). Pt verbalizes that he has good support at home and reports will have assistance/supervision during mobility/ADL tasks. Discussed benefits of CIR during this session with pt verbalizing preference for returning home at time of discharge. Given pt progress and 24hr supervision/assist at home feel this is appropriate, have updated discharge recommendations to reflect. Further discussed/educated pt in additional fall risk strategies after return home with pt verbalizing understanding. Will continue to follow while he remains acutely admitted.   Follow Up Recommendations  Supervision/Assistance - 24 hour;Home health OT    Equipment Recommendations  3 in 1 bedside commode          Precautions / Restrictions Precautions Precautions: Fall Restrictions Weight Bearing  Restrictions: No       Mobility Bed Mobility Overal bed mobility: Needs Assistance Bed Mobility: Supine to Sit;Sit to Supine     Supine to sit: Supervision Sit to supine: Supervision   General bed mobility comments: supervision for safety, increased time and effort.  Transfers Overall transfer level: Needs assistance Equipment used: Rolling walker (2 wheeled) Transfers: Sit to/from Stand Sit to Stand: Min guard         General transfer comment: close minguard for initial stand to RW, denies dizziness     Balance Overall balance assessment: Needs assistance Sitting-balance support: No upper extremity supported;Feet supported Sitting balance-Leahy Scale: Fair     Standing balance support: During functional activity;Bilateral upper extremity supported Standing balance-Leahy Scale: Poor Standing balance comment: improved stability with UE support                 Standardized Balance Assessment Standardized Balance Assessment : Dynamic Gait Index   Dynamic Gait Index Level Surface: Mild Impairment Change in Gait Speed: Mild Impairment Gait with Horizontal Head Turns: Mild Impairment Gait with Vertical Head Turns: Mild Impairment Gait and Pivot Turn: Mild Impairment Step Over Obstacle: Moderate Impairment Step Around Obstacles: Mild Impairment Steps: Mild Impairment Total Score: 15     ADL either performed or assessed with clinical judgement   ADL Overall ADL's : Needs assistance/impaired     Grooming: Brushing hair;Min guard;Standing Grooming Details (indicate cue type and reason): close minguard for standing balance`             Lower Body Dressing: Minimal assistance;Sit to/from stand Lower Body Dressing Details (indicate cue type and reason): minA intermittently with standing balance, pt able to doff/don bil  socks via figure 4 seated EOB             Functional mobility during ADLs: Minimal assistance;Min guard;Rolling walker General ADL  Comments: pt continues to list left with mobility and with intermittent LOB to the left, pt with fair awareness of his deficits. Denies dizziness today with activity     Vision       Perception     Praxis      Cognition Arousal/Alertness: Awake/alert Behavior During Therapy: WFL for tasks assessed/performed Overall Cognitive Status: Within Functional Limits for tasks assessed                                 General Comments: aware of deficits this session, states "i know I get a little off balance, and lean towards the left. I will have family with me 24/7 at home, especially for mobility"        Exercises     Shoulder Instructions       General Comments discussed and educated on benefits for CIR rehab, pt verbalizing preference to return home with [facility]    Pertinent Vitals/ Pain       Pain Assessment: No/denies pain  Home Living                                          Prior Functioning/Environment              Frequency  Min 3X/week        Progress Toward Goals  OT Goals(current goals can now be found in the care plan section)  Progress towards OT goals: Progressing toward goals  Acute Rehab OT Goals Patient Stated Goal: to go home OT Goal Formulation: With patient Time For Goal Achievement: 09/18/19 Potential to Achieve Goals: Good  Plan Discharge plan needs to be updated    Co-evaluation                 AM-PAC OT "6 Clicks" Daily Activity     Outcome Measure   Help from another person eating meals?: None Help from another person taking care of personal grooming?: A Little Help from another person toileting, which includes using toliet, bedpan, or urinal?: A Little Help from another person bathing (including washing, rinsing, drying)?: A Little Help from another person to put on and taking off regular upper body clothing?: A Little Help from another person to put on and taking off regular lower body  clothing?: A Little 6 Click Score: 19    End of Session Equipment Utilized During Treatment: Gait belt;Rolling walker  OT Visit Diagnosis: Unsteadiness on feet (R26.81);Other abnormalities of gait and mobility (R26.89);Ataxia, unspecified (R27.0)   Activity Tolerance Patient tolerated treatment well   Patient Left in bed;with call bell/phone within reach;with bed alarm set   Nurse Communication Mobility status        Time: [id number] OT Time Calculation (min): 22 min  Charges: OT General Charges $OT Visit: 1 Visit OT Treatments $Self Care/Home Management : 8-22 mins  Lou Cal, [place] Pager [phone] Office [phone]    Raymondo Band 09/05/2019, 11:35 AM

## 2019-09-06 DIAGNOSIS — R739 Hyperglycemia, unspecified: Secondary | ICD-10-CM | POA: Diagnosis not present

## 2019-09-06 DIAGNOSIS — R2681 Unsteadiness on feet: Secondary | ICD-10-CM | POA: Diagnosis not present

## 2019-09-06 DIAGNOSIS — M170 Bilateral primary osteoarthritis of knee: Secondary | ICD-10-CM | POA: Diagnosis not present

## 2019-09-06 DIAGNOSIS — I69398 Other sequelae of cerebral infarction: Secondary | ICD-10-CM | POA: Diagnosis not present

## 2019-09-06 DIAGNOSIS — Z9181 History of falling: Secondary | ICD-10-CM | POA: Diagnosis not present

## 2019-09-06 DIAGNOSIS — I10 Essential (primary) hypertension: Secondary | ICD-10-CM | POA: Diagnosis not present

## 2019-09-06 DIAGNOSIS — Z7982 Long term (current) use of aspirin: Secondary | ICD-10-CM | POA: Diagnosis not present

## 2019-09-06 DIAGNOSIS — Z7902 Long term (current) use of antithrombotics/antiplatelets: Secondary | ICD-10-CM | POA: Diagnosis not present

## 2019-09-06 DIAGNOSIS — Z85820 Personal history of malignant melanoma of skin: Secondary | ICD-10-CM | POA: Diagnosis not present

## 2019-09-06 DIAGNOSIS — M47812 Spondylosis without myelopathy or radiculopathy, cervical region: Secondary | ICD-10-CM | POA: Diagnosis not present

## 2019-09-06 DIAGNOSIS — E559 Vitamin D deficiency, unspecified: Secondary | ICD-10-CM | POA: Diagnosis not present

## 2019-09-06 DIAGNOSIS — H9190 Unspecified hearing loss, unspecified ear: Secondary | ICD-10-CM | POA: Diagnosis not present

## 2019-09-06 DIAGNOSIS — J4520 Mild intermittent asthma, uncomplicated: Secondary | ICD-10-CM | POA: Diagnosis not present

## 2019-09-06 DIAGNOSIS — H2512 Age-related nuclear cataract, left eye: Secondary | ICD-10-CM | POA: Diagnosis not present

## 2019-09-06 DIAGNOSIS — N400 Enlarged prostate without lower urinary tract symptoms: Secondary | ICD-10-CM | POA: Diagnosis not present

## 2019-09-06 DIAGNOSIS — R42 Dizziness and giddiness: Secondary | ICD-10-CM | POA: Diagnosis not present

## 2019-09-06 DIAGNOSIS — E785 Hyperlipidemia, unspecified: Secondary | ICD-10-CM | POA: Diagnosis not present

## 2019-09-11 DIAGNOSIS — J4520 Mild intermittent asthma, uncomplicated: Secondary | ICD-10-CM | POA: Diagnosis not present

## 2019-09-11 DIAGNOSIS — R2681 Unsteadiness on feet: Secondary | ICD-10-CM | POA: Diagnosis not present

## 2019-09-11 DIAGNOSIS — I69398 Other sequelae of cerebral infarction: Secondary | ICD-10-CM | POA: Diagnosis not present

## 2019-09-11 DIAGNOSIS — E785 Hyperlipidemia, unspecified: Secondary | ICD-10-CM | POA: Diagnosis not present

## 2019-09-11 DIAGNOSIS — R42 Dizziness and giddiness: Secondary | ICD-10-CM | POA: Diagnosis not present

## 2019-09-11 DIAGNOSIS — I10 Essential (primary) hypertension: Secondary | ICD-10-CM | POA: Diagnosis not present

## 2019-09-12 DIAGNOSIS — I69398 Other sequelae of cerebral infarction: Secondary | ICD-10-CM | POA: Diagnosis not present

## 2019-09-12 DIAGNOSIS — R42 Dizziness and giddiness: Secondary | ICD-10-CM | POA: Diagnosis not present

## 2019-09-12 DIAGNOSIS — I10 Essential (primary) hypertension: Secondary | ICD-10-CM | POA: Diagnosis not present

## 2019-09-12 DIAGNOSIS — J4520 Mild intermittent asthma, uncomplicated: Secondary | ICD-10-CM | POA: Diagnosis not present

## 2019-09-12 DIAGNOSIS — E785 Hyperlipidemia, unspecified: Secondary | ICD-10-CM | POA: Diagnosis not present

## 2019-09-12 DIAGNOSIS — R2681 Unsteadiness on feet: Secondary | ICD-10-CM | POA: Diagnosis not present

## 2019-09-13 DIAGNOSIS — E785 Hyperlipidemia, unspecified: Secondary | ICD-10-CM | POA: Diagnosis not present

## 2019-09-13 DIAGNOSIS — J4520 Mild intermittent asthma, uncomplicated: Secondary | ICD-10-CM | POA: Diagnosis not present

## 2019-09-13 DIAGNOSIS — R2681 Unsteadiness on feet: Secondary | ICD-10-CM | POA: Diagnosis not present

## 2019-09-13 DIAGNOSIS — I10 Essential (primary) hypertension: Secondary | ICD-10-CM | POA: Diagnosis not present

## 2019-09-13 DIAGNOSIS — I69398 Other sequelae of cerebral infarction: Secondary | ICD-10-CM | POA: Diagnosis not present

## 2019-09-13 DIAGNOSIS — R42 Dizziness and giddiness: Secondary | ICD-10-CM | POA: Diagnosis not present

## 2019-09-18 DIAGNOSIS — R42 Dizziness and giddiness: Secondary | ICD-10-CM | POA: Diagnosis not present

## 2019-09-18 DIAGNOSIS — I69398 Other sequelae of cerebral infarction: Secondary | ICD-10-CM | POA: Diagnosis not present

## 2019-09-18 DIAGNOSIS — E785 Hyperlipidemia, unspecified: Secondary | ICD-10-CM | POA: Diagnosis not present

## 2019-09-18 DIAGNOSIS — J4520 Mild intermittent asthma, uncomplicated: Secondary | ICD-10-CM | POA: Diagnosis not present

## 2019-09-18 DIAGNOSIS — I10 Essential (primary) hypertension: Secondary | ICD-10-CM | POA: Diagnosis not present

## 2019-09-18 DIAGNOSIS — R2681 Unsteadiness on feet: Secondary | ICD-10-CM | POA: Diagnosis not present

## 2019-09-20 ENCOUNTER — Telehealth: Payer: Self-pay | Admitting: General Practice

## 2019-09-20 ENCOUNTER — Other Ambulatory Visit: Payer: Self-pay

## 2019-09-20 ENCOUNTER — Other Ambulatory Visit: Payer: Self-pay | Admitting: *Deleted

## 2019-09-20 DIAGNOSIS — R2681 Unsteadiness on feet: Secondary | ICD-10-CM | POA: Diagnosis not present

## 2019-09-20 DIAGNOSIS — I69398 Other sequelae of cerebral infarction: Secondary | ICD-10-CM | POA: Diagnosis not present

## 2019-09-20 DIAGNOSIS — J4520 Mild intermittent asthma, uncomplicated: Secondary | ICD-10-CM | POA: Diagnosis not present

## 2019-09-20 DIAGNOSIS — E785 Hyperlipidemia, unspecified: Secondary | ICD-10-CM | POA: Diagnosis not present

## 2019-09-20 DIAGNOSIS — R42 Dizziness and giddiness: Secondary | ICD-10-CM | POA: Diagnosis not present

## 2019-09-20 DIAGNOSIS — I10 Essential (primary) hypertension: Secondary | ICD-10-CM | POA: Diagnosis not present

## 2019-09-20 NOTE — Patient Outreach (Signed)
[facility]) Care Management  09/20/2019  Elijah [name] Nov 15, 1934 [id number]   RED ON EMMI ALERT - Stroke Day # 13 Date: 3/23 Red Alert Reason: didn't go to follow up appointment   Outreach attempt #1, successful.  Referral received from care management assistant for above noted EMMI alert, member recently admitted to hospital under observation for stroke.  Per chart, he also has history of HTN, HLD, and BPH.  Call placed to member, identity verified.  This care manager introduced self and stated purpose of call, [facility] care management services explained.    Member report he has recovered well, denies any deficits.  State he had evaluation from PT and OT today, reportedly does not need any further sessions since he is doing so well.  He is independent in ADL's, has wife in the home and son living next door that can help when needed.  State he hadn't been to his follow up appointments because they were scheduled for a later date.  Will have appointment with PCP tomorrow and with neurology on 4/12.  His son will provide transportation.  Medications at discharge reviewed, he denies needing assistance financially or with management.  State he was told by his PCP prior to admission that he labs were good and that he would be able to stop his Simvastatin altogether however was placed on Atorvastatin at discharge.  He has been taking it (as well as Aspirin and Plavix) but will discuss with PCP tomorrow.    Report he has been reviewing EMMI education regarding stroke recovery, denies any questions.  Denies the need for further [facility] involvement but is open to receiving contact information for future purposes.  Will not open case at this time but will send successful letter with contact information for this care manager.  Valente David, [place], MSN [place] [phone]

## 2019-09-20 NOTE — Telephone Encounter (Signed)
Elijah [name] is calling and wanted to let Dr. Ethelene Hal know that patient is doing well and is ready for discharge next week. Patient has a TOC appointment with Dr. Ethelene Hal tomorrow. CB is [phone]

## 2019-09-21 ENCOUNTER — Encounter: Payer: Self-pay | Admitting: Family Medicine

## 2019-09-21 ENCOUNTER — Ambulatory Visit (INDEPENDENT_AMBULATORY_CARE_PROVIDER_SITE_OTHER): Payer: Medicare Other | Admitting: Family Medicine

## 2019-09-21 VITALS — BP 144/78 | HR 76 | Temp 97.8°F | Ht 67.0 in | Wt 158.2 lb

## 2019-09-21 DIAGNOSIS — E7800 Pure hypercholesterolemia, unspecified: Secondary | ICD-10-CM | POA: Insufficient documentation

## 2019-09-21 DIAGNOSIS — I10 Essential (primary) hypertension: Secondary | ICD-10-CM

## 2019-09-21 DIAGNOSIS — E559 Vitamin D deficiency, unspecified: Secondary | ICD-10-CM

## 2019-09-21 DIAGNOSIS — Z8673 Personal history of transient ischemic attack (TIA), and cerebral infarction without residual deficits: Secondary | ICD-10-CM | POA: Diagnosis not present

## 2019-09-21 LAB — HEPATIC FUNCTION PANEL
ALT: 17 U/L (ref 0–53)
AST: 16 U/L (ref 0–37)
Albumin: 4.1 g/dL (ref 3.5–5.2)
Alkaline Phosphatase: 73 U/L (ref 39–117)
Bilirubin, Direct: 0.3 mg/dL (ref 0.0–0.3)
Total Bilirubin: 1.1 mg/dL (ref 0.2–1.2)
Total Protein: 6.6 g/dL (ref 6.0–8.3)

## 2019-09-21 LAB — CBC
HCT: 45.1 % (ref 39.0–52.0)
Hemoglobin: 15.6 g/dL (ref 13.0–17.0)
MCHC: 34.6 g/dL (ref 30.0–36.0)
MCV: 93.9 fl (ref 78.0–100.0)
Platelets: 273 10*3/uL (ref 150.0–400.0)
RBC: 4.81 Mil/uL (ref 4.22–5.81)
RDW: 13.6 % (ref 11.5–15.5)
WBC: 8.4 10*3/uL (ref 4.0–10.5)

## 2019-09-21 LAB — BASIC METABOLIC PANEL
BUN: 11 mg/dL (ref 6–23)
CO2: 28 mEq/L (ref 19–32)
Calcium: 9.6 mg/dL (ref 8.4–10.5)
Chloride: 106 mEq/L (ref 96–112)
Creatinine, Ser: 1.03 mg/dL (ref 0.40–1.50)
GFR: 69 mL/min (ref >60.00)
Glucose, Bld: 105 mg/dL — ABNORMAL HIGH (ref 70–99)
Potassium: 4 mEq/L (ref 3.5–5.1)
Sodium: 142 mEq/L (ref 135–145)

## 2019-09-21 LAB — VITAMIN D 25 HYDROXY (VIT D DEFICIENCY, FRACTURES): VITD: 65.32 ng/mL (ref 30.00–100.00)

## 2019-09-21 LAB — LDL CHOLESTEROL, DIRECT: Direct LDL: 50 mg/dL

## 2019-09-21 NOTE — Progress Notes (Signed)
Established Patient Office Visit  Subjective:  Patient ID: Elijah [name], male    DOB: 1934/11/29  Age: 85 y.o. MRN: [id number]  CC:  Chief Complaint  Patient presents with  . Hospitalization Follow-up    hospital follow up on CVA, pt would like to go over medications that were given at hospital.     HPI Elijah [name] presents for hospital discharge follow-up status post recent cerebellar stroke with occlusion of PICA.  He was discharged on Plavix and aspirin.  Noted suggested Plavix use for 3 months then switching to aspirin.  He was switched from simvastatin to atorvastatin.  LDL cholesterol measured in the hospital was 137.  He is recovering at home with his wife.  He continues to work with physical therapy.  He does have a history of reflux disease and is a little bit concerned about taking the high-dose aspirin.  He has been walking 2 to 3 miles daily with his wife.  Past Medical History:  Diagnosis Date  . Allergy   . Arthritis    hands  . Asthma   . Elevated PSA    s/p neg biopsy  . Enlarged prostate   . Hyperlipidemia   . Hypertension   . Wears hearing aid     Past Surgical History:  Procedure Laterality Date  . CATARACT EXTRACTION W/PHACO Right 05/08/2015   Procedure: CATARACT EXTRACTION PHACO AND INTRAOCULAR LENS PLACEMENT (IOC);  Surgeon: Leandrew Koyanagi, MD;  Location: [facility];  Service: Ophthalmology;  Laterality: Right;  . PROSTATE BIOPSY    . TONSILLECTOMY      Family History  Problem Relation Age of Onset  . Heart disease Father   . Stroke Mother   . Cancer Sister     Social History   Socioeconomic History  . Marital status: Married    Spouse name: Not on file  . Number of children: 1  . Years of education: Not on file  . Highest education level: Not on file  Occupational History  . Occupation: Retired Technical brewer: retired  Tobacco Use  . Smoking status: Never Smoker  . Smokeless tobacco: Never Used  Substance  and Sexual Activity  . Alcohol use: Yes    Alcohol/week: 14.0 standard drinks    Types: 14 Shots of liquor per week  . Drug use: No  . Sexual activity: Yes  Other Topics Concern  . Not on file  Social History Narrative   One biological son, one adopted son, two granddaughters, 37 & 48 yo      Has living will and HPOA- wife [name].   Would desire CPR.  Would not want prolonged life support.   Social Determinants of Health   Financial Resource Strain:   . Difficulty of Paying Living Expenses:   Food Insecurity:   . Worried About Charity fundraiser in the Last Year:   . Arboriculturist in the Last Year:   Transportation Needs:   . Film/video editor (Medical):   Marland Kitchen Lack of Transportation (Non-Medical):   Physical Activity:   . Days of Exercise per Week:   . Minutes of Exercise per Session:   Stress:   . Feeling of Stress :   Social Connections:   . Frequency of Communication with Friends and Family:   . Frequency of Social Gatherings with Friends and Family:   . Attends Religious Services:   . Active Member of Clubs or Organizations:   . Attends  Club or Organization Meetings:   Marland Kitchen Marital Status:   Intimate Partner Violence:   . Fear of Current or Ex-Partner:   . Emotionally Abused:   Marland Kitchen Physically Abused:   . Sexually Abused:     Outpatient Medications Prior to Visit  Medication Sig Dispense Refill  . albuterol (VENTOLIN HFA) 108 (90 Base) MCG/ACT inhaler Inhale 1-2 puffs into the lungs every 4 (four) hours as needed for wheezing or shortness of breath.    Marland Kitchen amLODipine (NORVASC) 5 MG tablet Take 1 tablet (5 mg total) by mouth daily. 90 tablet 2  . aspirin 325 MG tablet Take 1 tablet (325 mg total) by mouth daily. 30 tablet 0  . atorvastatin (LIPITOR) 40 MG tablet Take 1 tablet (40 mg total) by mouth daily at 6 PM. 30 tablet 0  . cholecalciferol (VITAMIN D3) 25 MCG (1000 UNIT) tablet Take 2,000 Units by mouth daily.    . clopidogrel (PLAVIX) 75 MG tablet Take 1  tablet (75 mg total) by mouth daily. 30 tablet 2  . fluticasone (FLONASE) 50 MCG/ACT nasal spray USE 2 SPRAYS IN EACH NOSTRIL DURING ALLERGY SEASON. 16 g 6  . quinapril (ACCUPRIL) 10 MG tablet Take 1 tablet (10 mg total) by mouth daily. TAKE 1 TABLET BY MOUTH EVERY DAY    . Calcium Carbonate-Vitamin D (TH CALCIUM CARBONATE-VITAMIN D) 600-400 MG-UNIT per tablet Take 1 tablet by mouth daily.      . pseudoephedrine (SUDAFED) 30 MG tablet Take 30 mg by mouth as needed.       No facility-administered medications prior to visit.    Allergies  Allergen Reactions  . Penicillins Hives    ROS Review of Systems  Constitutional: Negative.   HENT: Negative.   Respiratory: Negative.   Cardiovascular: Negative.   Gastrointestinal: Negative.   Genitourinary: Negative.   Musculoskeletal: Negative for gait problem and joint swelling.  Neurological: Negative for dizziness, seizures, facial asymmetry, speech difficulty, weakness, light-headedness and numbness.  Hematological: Does not bruise/bleed easily.  Psychiatric/Behavioral: Negative.       Objective:    Physical Exam  Constitutional: He is oriented to person, place, and time. He appears well-developed and well-nourished. No distress.  HENT:  Head: Normocephalic and atraumatic.  Right Ear: External ear normal.  Left Ear: External ear normal.  Eyes: Conjunctivae are normal. Right eye exhibits no discharge. Left eye exhibits no discharge. No scleral icterus.  Neck: No JVD present. No tracheal deviation present. No thyromegaly present.  Cardiovascular: Normal rate, regular rhythm and normal heart sounds.  Pulmonary/Chest: Effort normal and breath sounds normal. No stridor.  Abdominal: Bowel sounds are normal.  Lymphadenopathy:    He has no cervical adenopathy.  Neurological: He is alert and oriented to person, place, and time. He displays a negative Romberg sign.  Skin: Skin is warm and dry. He is not diaphoretic.  Psychiatric: He has a  normal mood and affect. His behavior is normal.    BP (!) 144/78   Pulse 76   Temp 97.8 F (36.6 C) (Tympanic)   Ht 5\' 7"  (1.702 m)   Wt 158 lb 3.2 oz (71.8 kg)   SpO2 97%   BMI 24.78 kg/m  Wt Readings from Last 3 Encounters:  09/21/19 158 lb 3.2 oz (71.8 kg)  09/04/19 155 lb 14.4 oz (70.7 kg)  02/27/19 166 lb (75.3 kg)     There are no preventive care reminders to display for this patient.  There are no preventive care reminders to display for  this patient.  No results found for: TSH Lab Results  Component Value Date   WBC 10.2 09/04/2019   HGB 16.5 09/04/2019   HCT 45.6 09/04/2019   MCV 89.8 09/04/2019   PLT 210 09/04/2019   Lab Results  Component Value Date   NA 140 09/05/2019   K 3.7 09/05/2019   CO2 24 09/05/2019   GLUCOSE 117 (H) 09/05/2019   BUN 15 09/05/2019   CREATININE 1.17 09/05/2019   BILITOT 1.6 (H) 09/03/2019   ALKPHOS 67 09/03/2019   AST 24 09/03/2019   ALT 17 09/03/2019   PROT 7.6 09/03/2019   ALBUMIN 4.4 09/03/2019   CALCIUM 9.6 09/05/2019   ANIONGAP 13 09/05/2019   GFR 65.42 02/27/2019   Lab Results  Component Value Date   CHOL 216 (H) 09/03/2019   Lab Results  Component Value Date   HDL 74 09/03/2019   Lab Results  Component Value Date   LDLCALC 124 (H) 09/03/2019   Lab Results  Component Value Date   TRIG 89 09/03/2019   Lab Results  Component Value Date   CHOLHDL 2.9 09/03/2019   Lab Results  Component Value Date   HGBA1C 4.9 09/03/2019      Assessment & Plan:   Problem List Items Addressed This Visit      Cardiovascular and Mediastinum   Essential hypertension   Relevant Orders   CBC   Basic metabolic panel   Hepatic function panel     Other   Vitamin D deficiency - Primary   Relevant Orders   VITAMIN D 25 Hydroxy (Vit-D Deficiency, Fractures)   History of cerebrovascular accident (CVA) involving cerebellum   Relevant Orders   Basic metabolic panel   LDL cholesterol, direct   Pure hypercholesterolemia    Relevant Orders   LDL cholesterol, direct      No orders of the defined types were placed in this encounter.   Follow-up: Return in about 3 months (around 12/22/2019).   He has been switched to atorvastatin which I agree.  Would like to see the LDL down to 70.  Discharge note says Plavix for 3 months.  Significant vascular disease throughout the head and neck.  Am seeking neurology's opinion on whether or not to continue Plavix for the duration. Libby Maw, MD

## 2019-09-21 NOTE — Telephone Encounter (Signed)
Office visit with Dr. Ethelene Hal

## 2019-09-25 DIAGNOSIS — R42 Dizziness and giddiness: Secondary | ICD-10-CM | POA: Diagnosis not present

## 2019-09-25 DIAGNOSIS — I69398 Other sequelae of cerebral infarction: Secondary | ICD-10-CM | POA: Diagnosis not present

## 2019-09-25 DIAGNOSIS — J4520 Mild intermittent asthma, uncomplicated: Secondary | ICD-10-CM | POA: Diagnosis not present

## 2019-09-25 DIAGNOSIS — E785 Hyperlipidemia, unspecified: Secondary | ICD-10-CM | POA: Diagnosis not present

## 2019-09-25 DIAGNOSIS — I10 Essential (primary) hypertension: Secondary | ICD-10-CM | POA: Diagnosis not present

## 2019-09-25 DIAGNOSIS — R2681 Unsteadiness on feet: Secondary | ICD-10-CM | POA: Diagnosis not present

## 2019-09-26 DIAGNOSIS — E785 Hyperlipidemia, unspecified: Secondary | ICD-10-CM | POA: Diagnosis not present

## 2019-09-26 DIAGNOSIS — R2681 Unsteadiness on feet: Secondary | ICD-10-CM | POA: Diagnosis not present

## 2019-09-26 DIAGNOSIS — I10 Essential (primary) hypertension: Secondary | ICD-10-CM | POA: Diagnosis not present

## 2019-09-26 DIAGNOSIS — J4520 Mild intermittent asthma, uncomplicated: Secondary | ICD-10-CM | POA: Diagnosis not present

## 2019-09-26 DIAGNOSIS — I69398 Other sequelae of cerebral infarction: Secondary | ICD-10-CM | POA: Diagnosis not present

## 2019-09-26 DIAGNOSIS — R42 Dizziness and giddiness: Secondary | ICD-10-CM | POA: Diagnosis not present

## 2019-10-09 ENCOUNTER — Encounter: Payer: Self-pay | Admitting: Adult Health

## 2019-10-09 ENCOUNTER — Ambulatory Visit (INDEPENDENT_AMBULATORY_CARE_PROVIDER_SITE_OTHER): Payer: Medicare Other | Admitting: Adult Health

## 2019-10-09 ENCOUNTER — Other Ambulatory Visit: Payer: Self-pay

## 2019-10-09 VITALS — BP 147/80 | HR 79 | Temp 97.7°F | Ht 67.0 in | Wt 158.2 lb

## 2019-10-09 DIAGNOSIS — I10 Essential (primary) hypertension: Secondary | ICD-10-CM

## 2019-10-09 DIAGNOSIS — I6503 Occlusion and stenosis of bilateral vertebral arteries: Secondary | ICD-10-CM | POA: Diagnosis not present

## 2019-10-09 DIAGNOSIS — E785 Hyperlipidemia, unspecified: Secondary | ICD-10-CM | POA: Diagnosis not present

## 2019-10-09 DIAGNOSIS — I63541 Cerebral infarction due to unspecified occlusion or stenosis of right cerebellar artery: Secondary | ICD-10-CM | POA: Diagnosis not present

## 2019-10-09 MED ORDER — ATORVASTATIN CALCIUM 40 MG PO TABS: 40.0000 mg | ORAL_TABLET | Freq: Every day | ORAL | 3 refills | Status: DC

## 2019-10-09 MED ORDER — ASPIRIN EC 325 MG PO TBEC: 325.0000 mg | DELAYED_RELEASE_TABLET | Freq: Every day | ORAL | 3 refills | Status: DC

## 2019-10-09 NOTE — Patient Instructions (Signed)
Continue aspirin 325 mg daily, Plavix and atorvastatin 40 mg daily for secondary stroke prevention - ongoing use of high dose aspirin, plavix and statin will greatly help reduce your risk of recurrent stroke in regards to intracranial stenosis especially over the first 3 months. After that, continue aspirin and plavix for life for secondary stroke prevention.   We will plan on repeating carotid ultrasound after prior visit - we will discuss that at the next follow up visit  Continue Plavix for additional 2 months and then discontinue  Continue to follow up with PCP regarding cholesterol and blood pressure management   Continue to monitor blood pressure at home  Maintain strict control of hypertension with blood pressure goal below 130/90, diabetes with hemoglobin A1c goal below 6.5% and cholesterol with LDL cholesterol (bad cholesterol) goal below 70 mg/dL. I also advised the patient to eat a healthy diet with plenty of whole grains, cereals, fruits and vegetables, exercise regularly and maintain ideal body weight.  Followup in the future with me in 4 months or call earlier if needed       Thank you for coming to see [place] at [facility]. I hope we have been able to provide you high quality care today.  You may receive a patient satisfaction survey over the next few weeks. We would appreciate your feedback and comments so that we may continue to improve ourselves and the health of our patients.

## 2019-10-09 NOTE — Progress Notes (Signed)
[facility] [address] [place]. [place] [address] [phone]       HOSPITAL FOLLOW UP NOTE  [name] Date of Birth:  [birth year] Medical Record Number:  [id number]   Reason for Referral:  hospital stroke follow up   SUBJECTIVE:   CHIEF COMPLAINT:  Chief Complaint  Patient presents with  . Follow-up    Follow up for hospital stroke follow room 9    HPI:  [name] is a 85 y.o. Elijah with history of HTN, HLD, BPH, asthma, arthritis, hearing loss, and melanoma  who presented on 09/03/2019 withdizziness, nausea, headache, and near syncope.   Evaluated by stroke team and Dr. 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 Initiated DAPT for 3 months due to intracranial arthrosclerosis then aspirin alone.  History of HTN with BP initially elevated 200/113 stabilized throughout admission with long-term BP goal 130-150 given large vessel stenosis.  LDL 124 previously on simvastatin and recommend initiating atorvastatin 40 mg daily.  A1c 4.9 without history of DM.  Other stroke risk factors include diffuse intracranial stenosis, advanced age, EtOH use, family history of stroke and prior stroke on imaging.  Evaluated by therapies and discharged home in stable condition with recommendation of home health OT.  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 Old L cerebellar infarcts. Small vessel disease. Atrophy.   MRI  R cerebellar PICA moderate sized infarct and R cerebellar vermis small infarct. Old L cerebellar infarcts. Small vessel disease. Atrophy. Intracranial L VA w/ possible high-grade occlusion.   CTA head and neck R VA origin severe stenosis. Portions R V2 high-grade stenosis or occlusion. Right PICA occluded. L V1 occlusion. L VA  atherosclerosis w/ sites of severe stenosis. BA small w/ atherosclerosis irregularity and sites of moderate / moderate severe stenosis. L PCA small or severely stenotic.    2D Echo EF 55-60%. No source of embolus   LDL 124  HgbA1c 4.9  SCDs for VTE prophylaxis  No antithrombotic prior to admission, now on aspirin 325 mg daily and clopidogrel 75 mg daily. Given severe intracranial atherosclerosis, continue DAPT x 3 months then aspirin alone.   Therapy recommendations:  [facility] OT    Today, 10/09/2019, [name] has recovered well without residual deficits.   Home health therapies cleared patient and returned back to all prior activities without difficulty maintaining ADLs and IADLs without difficulty.  He questions return to driving (son is requesting clearance of driving).  Continues on DAPT but does report recently being told by PCP to lower aspirin dosage to 81 mg daily but denies any difficulty tolerating increased dosage.  Continued on atorvastatin for 1 month without reported side effects -was unsure if ongoing use was indicated as no additional refills.  Recent lab work by PCP on 09/21/2019 showed direct LDL 50.  Blood pressure today satisfactory at 147/80.  No further concerns at this time.      ROS:   14 system review of systems performed and negative with exception of no complaints  PMH:  Past Medical History:  Diagnosis Date  . Allergy   . Arthritis    hands  . Asthma   . Elevated PSA    s/p neg biopsy  . Enlarged prostate   . Hyperlipidemia   . Hypertension   .  Stroke ([place])   . Wears hearing aid     PSH:  Past Surgical History:  Procedure Laterality Date  . CATARACT EXTRACTION W/PHACO Right 05/08/2015   Procedure: CATARACT EXTRACTION PHACO AND INTRAOCULAR LENS PLACEMENT (IOC);  Surgeon: Elijah Koyanagi, MD;  Location: [place];  Service: Ophthalmology;  Laterality: Right;  . PROSTATE BIOPSY    . TONSILLECTOMY      Social History:  Social History     Socioeconomic History  . Marital status: Married    Spouse name: Not on file  . Number of children: 1  . Years of education: Not on file  . Highest education level: Not on file  Occupational History  . Occupation: Retired Technical brewer: retired  Tobacco Use  . Smoking status: Never Smoker  . Smokeless tobacco: Never Used  Substance and Sexual Activity  . Alcohol use: Yes    Alcohol/week: 14.0 standard drinks    Types: 14 Shots of liquor per week  . Drug use: No  . Sexual activity: Yes  Other Topics Concern  . Not on file  Social History Narrative   One biological son, one adopted son, two granddaughters, 30 & 44 yo      Has living will and HPOA- wife Elijah [name].   Would desire CPR.  Would not want prolonged life support.   Social Determinants of Health   Financial Resource Strain:   . Difficulty of Paying Living Expenses:   Food Insecurity:   . Worried About Charity fundraiser in the Last Year:   . Arboriculturist in the Last Year:   Transportation Needs:   . Film/video editor (Medical):   Marland Kitchen Lack of Transportation (Non-Medical):   Physical Activity:   . Days of Exercise per Week:   . Minutes of Exercise per Session:   Stress:   . Feeling of Stress :   Social Connections:   . Frequency of Communication with Friends and Family:   . Frequency of Social Gatherings with Friends and Family:   . Attends Religious Services:   . Active Member of Clubs or Organizations:   . Attends Archivist Meetings:   Marland Kitchen Marital Status:   Intimate Partner Violence:   . Fear of Current or Ex-Partner:   . Emotionally Abused:   Marland Kitchen Physically Abused:   . Sexually Abused:     Family History:  Family History  Problem Relation Age of Onset  . Heart disease Father   . Stroke Mother   . Cancer Sister     Medications:   Current Outpatient Medications on File Prior to Visit  Medication Sig Dispense Refill  . albuterol (VENTOLIN HFA) 108 (90 Base)  MCG/ACT inhaler Inhale 1-2 puffs into the lungs every 4 (four) hours as needed for wheezing or shortness of breath.    Marland Kitchen amLODipine (NORVASC) 5 MG tablet Take 1 tablet (5 mg total) by mouth daily. 90 tablet 2  . cholecalciferol (VITAMIN D3) 25 MCG (1000 UNIT) tablet Take 2,000 Units by mouth daily.    . clopidogrel (PLAVIX) 75 MG tablet Take 1 tablet (75 mg total) by mouth daily. 30 tablet 2  . fluticasone (FLONASE) 50 MCG/ACT nasal spray USE 2 SPRAYS IN EACH NOSTRIL DURING ALLERGY SEASON. 16 g 6  . pseudoephedrine (SUDAFED) 30 MG tablet Take 30 mg by mouth as needed.      . quinapril (ACCUPRIL) 10 MG tablet Take 1 tablet (10 mg total) by mouth daily.  TAKE 1 TABLET BY MOUTH EVERY DAY     No current facility-administered medications on file prior to visit.    Allergies:   Allergies  Allergen Reactions  . Penicillins Hives      OBJECTIVE:  Today's Vitals   10/09/19 1354  BP: (!) 147/80  Pulse: 79  Temp: 97.7 F (36.5 C)  Weight: 158 lb 3.2 oz (71.8 kg)  Height: 5\' 7"  (1.702 m)   Body mass index is 24.78 kg/m.   Depression screen [facility] 2/9 10/09/2019 01/18/2019 08/24/2017  Decreased Interest 0 0 0  Down, Depressed, Hopeless 0 0 0  PHQ - 2 Score 0 0 0    Physical Exam   General: well developed, well nourished,  very pleasant elderly Caucasian Elijah, seated, in no evident distress Head: head normocephalic and atraumatic.   Neck: supple with no carotid or supraclavicular bruits Cardiovascular: regular rate and rhythm with occasional extra beat (prior EKG showed evidence of PVC), no murmurs Musculoskeletal: no deformity Skin:  no rash/petichiae Vascular:  Normal pulses all extremities   Neurologic Exam Mental Status: Awake and fully alert.   Normal speech and language.  Oriented to place and time. Recent and remote memory intact. Attention span, concentration and fund of knowledge appropriate. Mood and affect appropriate.  Cranial Nerves: Fundoscopic exam reveals sharp disc  margins. Pupils equal, briskly reactive to light. Extraocular movements full without nystagmus. Visual fields full to confrontation. Hearing intact. Facial sensation intact. Face, tongue, palate moves normally and symmetrically.  Motor: Normal bulk and tone. Normal strength in all tested extremity muscles. Sensory.: intact to touch , pinprick , position and vibratory sensation.  Coordination: Rapid alternating movements normal in all extremities. Finger-to-nose and heel-to-shin performed accurately bilaterally. Gait and Station: Arises from chair without difficulty. Stance is normal. Gait demonstrates normal stride length and balance without assistive device.  Able to tandem walk without difficulty. Reflexes: 1+ and symmetric. Toes downgoing.     NIHSS  0 Modified Rankin  0     ASSESSMENT: Elijah [name] is a 85 y.o. 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 Vascular risk factors include HTN, HLD, prior stroke on imaging, diffuse intracranial stenosis/occlusion, large vessel occlusion/stenosis and EtOH use.  Recovering well from a stroke standpoint without residual deficits.  He does have multiple questions regarding medications that were prescribed at discharge.    PLAN:  1. Right cerebellar PICA stroke:  -Due to symptomatic large vessel disease, encouraged importance of aggressive medical management with  aspirin 325 mg daily and clopidogrel 75 mg daily  and atorvastatin 40 mg daily as this has been found superior to surgical intervention.  As he was tolerating full dose aspirin previously, recommended restarting aspirin 325 mg daily in addition to clopidogrel and restarting atorvastatin 40 mg daily.  Prescriptions provided and request further refills by PCP.  Advised to continue Plavix for additional 2 months and  then discontinue as 3 months DAPT recommended and no indication for prolonged therapy.   -Maintain strict control of hypertension with blood pressure goal between 130-150 to ensure adequate perfusion with large vessel stenosis, diabetes with hemoglobin A1c goal below 6.5% and cholesterol with LDL cholesterol (bad cholesterol) goal below 70 mg/dL.  I also advised the patient to eat a healthy diet with plenty of whole grains, cereals, fruits and vegetables, exercise regularly with at least 30 minutes  of continuous activity daily and maintain ideal body weight. 2. HTN: Stable.  Defer monitoring and management to PCP 3. HLD: Recent LDL satisfactory.  Discussion regarding importance of lifelong use of statin unless contraindicated for secondary stroke prevention.  Refill provided and request future refills and management by PCP 4. Bilateral vertebral artery stenosis/occlusion: will obtain carotid ultrasound around 02/2020 for surveillance monitoring.  Discussion regarding importance of continuing Plavix for additional 2 months and lifelong use of aspirin and atorvastatin as well as managing stroke risk factors, maintaining a healthy diet and adequate exercise.    Follow up in 4 months or call earlier if needed   I spent 46 minutes of face-to-face and non-face-to-face time with patient.  This included previsit chart review, lab review, study review, order entry, electronic health record documentation, patient education regarding recent stroke, extra and intracranial stenosis, review of recommended medications and indication, importance of managing stroke risk factors and answered all questions to patient satisfaction    Frann Rider, [facility]  [facility] [address] [place], [place] [address]  Phone [phone] Fax [phone] Note: This document was prepared with digital dictation and possible smart phrase technology. Any transcriptional errors that result from  this process are unintentional.

## 2019-10-10 NOTE — Progress Notes (Signed)
I agree with the above plan 

## 2019-10-13 ENCOUNTER — Encounter: Payer: Self-pay | Admitting: Family Medicine

## 2019-10-14 NOTE — Telephone Encounter (Signed)
Lets stick with 325mg  dose for now.

## 2019-12-01 ENCOUNTER — Other Ambulatory Visit: Payer: Self-pay

## 2019-12-01 MED ORDER — QUINAPRIL HCL 10 MG PO TABS: 10.0000 mg | ORAL_TABLET | Freq: Every day | ORAL | 0 refills | Status: DC

## 2019-12-21 ENCOUNTER — Other Ambulatory Visit: Payer: Self-pay

## 2019-12-22 ENCOUNTER — Encounter: Payer: Self-pay | Admitting: Family Medicine

## 2019-12-22 ENCOUNTER — Ambulatory Visit (INDEPENDENT_AMBULATORY_CARE_PROVIDER_SITE_OTHER): Payer: Medicare Other | Admitting: Family Medicine

## 2019-12-22 VITALS — BP 154/76 | HR 68 | Temp 97.7°F | Ht 67.0 in | Wt 157.0 lb

## 2019-12-22 DIAGNOSIS — E7800 Pure hypercholesterolemia, unspecified: Secondary | ICD-10-CM | POA: Diagnosis not present

## 2019-12-22 DIAGNOSIS — E559 Vitamin D deficiency, unspecified: Secondary | ICD-10-CM

## 2019-12-22 DIAGNOSIS — T887XXA Unspecified adverse effect of drug or medicament, initial encounter: Secondary | ICD-10-CM

## 2019-12-22 DIAGNOSIS — Z8673 Personal history of transient ischemic attack (TIA), and cerebral infarction without residual deficits: Secondary | ICD-10-CM | POA: Diagnosis not present

## 2019-12-22 DIAGNOSIS — I10 Essential (primary) hypertension: Secondary | ICD-10-CM

## 2019-12-22 MED ORDER — AMLODIPINE BESYLATE 10 MG PO TABS: 10.0000 mg | ORAL_TABLET | Freq: Every day | ORAL | 3 refills | Status: DC

## 2019-12-22 NOTE — Progress Notes (Signed)
Established Patient Office Visit  Subjective:  Patient ID: Elijah [name], male    DOB: 08-Jul-1934  Age: 85 y.o. MRN: [id number]  CC:  Chief Complaint  Patient presents with  . Follow-up    3 month follow up patient states that aspirin seems to be causing lots of bruising.     HPI Naveen Clardy presents for follow-up of his hypertension, elevated cholesterol, vitamin D deficiency status post cerebellar stroke.  Has completed his course of therapy with Plavix.  Continues with high-dose aspirin.  Has noted some increasing bruising but denies gastritis, melena blood in the stool or urine.  Blood pressure has been running in the 140s over 90s range.  He had taken Accupril 10 and Norvasc 10 in the past.  Amlodipine dose was decreased out of concern that his blood pressure was too low.  This was prior to his recent stroke.  Having no issues taking the higher dose atorvastatin.  Vitamin D has been replaced.  He is now taking 1000 international units every other day.  Past Medical History:  Diagnosis Date  . Allergy   . Arthritis    hands  . Asthma   . Elevated PSA    s/p neg biopsy  . Enlarged prostate   . Hyperlipidemia   . Hypertension   . Stroke ([place])   . Wears hearing aid     Past Surgical History:  Procedure Laterality Date  . CATARACT EXTRACTION W/PHACO Right 05/08/2015   Procedure: CATARACT EXTRACTION PHACO AND INTRAOCULAR LENS PLACEMENT (IOC);  Surgeon: Leandrew Koyanagi, MD;  Location: [place];  Service: Ophthalmology;  Laterality: Right;  . PROSTATE BIOPSY    . TONSILLECTOMY      Family History  Problem Relation Age of Onset  . Heart disease Father   . Stroke Mother   . Cancer Sister     Social History   Socioeconomic History  . Marital status: Married    Spouse name: Not on file  . Number of children: 1  . Years of education: Not on file  . Highest education level: Not on file  Occupational History  . Occupation: Retired Oncologist: retired  Tobacco Use  . Smoking status: Never Smoker  . Smokeless tobacco: Never Used  Substance and Sexual Activity  . Alcohol use: Yes    Alcohol/week: 14.0 standard drinks    Types: 14 Shots of liquor per week  . Drug use: No  . Sexual activity: Yes  Other Topics Concern  . Not on file  Social History Narrative   One biological son, one adopted son, two granddaughters, 60 & 55 yo      Has living will and HPOA- wife Chrsitopher Wik.   Would desire CPR.  Would not want prolonged life support.   Social Determinants of Health   Financial Resource Strain:   . Difficulty of Paying Living Expenses:   Food Insecurity:   . Worried About Charity fundraiser in the Last Year:   . Arboriculturist in the Last Year:   Transportation Needs:   . Film/video editor (Medical):   Marland Kitchen Lack of Transportation (Non-Medical):   Physical Activity:   . Days of Exercise per Week:   . Minutes of Exercise per Session:   Stress:   . Feeling of Stress :   Social Connections:   . Frequency of Communication with Friends and Family:   . Frequency of Social Gatherings with Friends and Family:   .  Attends Religious Services:   . Active Member of Clubs or Organizations:   . Attends Archivist Meetings:   Marland Kitchen Marital Status:   Intimate Partner Violence:   . Fear of Current or Ex-Partner:   . Emotionally Abused:   Marland Kitchen Physically Abused:   . Sexually Abused:     Outpatient Medications Prior to Visit  Medication Sig Dispense Refill  . albuterol (VENTOLIN HFA) 108 (90 Base) MCG/ACT inhaler Inhale 1-2 puffs into the lungs every 4 (four) hours as needed for wheezing or shortness of breath.    Marland Kitchen aspirin EC 325 MG tablet Take 1 tablet (325 mg total) by mouth daily. 90 tablet 3  . atorvastatin (LIPITOR) 40 MG tablet Take 1 tablet (40 mg total) by mouth daily at 6 PM. 90 tablet 3  . cholecalciferol (VITAMIN D3) 25 MCG (1000 UNIT) tablet Take 2,000 Units by mouth daily.    . fluticasone  (FLONASE) 50 MCG/ACT nasal spray USE 2 SPRAYS IN EACH NOSTRIL DURING ALLERGY SEASON. 16 g 6  . pseudoephedrine (SUDAFED) 30 MG tablet Take 30 mg by mouth as needed.      . quinapril (ACCUPRIL) 10 MG tablet Take 1 tablet (10 mg total) by mouth daily. 90 tablet 0  . amLODipine (NORVASC) 5 MG tablet Take 1 tablet (5 mg total) by mouth daily. 90 tablet 2   No facility-administered medications prior to visit.    Allergies  Allergen Reactions  . Penicillins Hives    ROS Review of Systems  Constitutional: Negative.   HENT: Negative.   Eyes: Negative for photophobia and visual disturbance.  Respiratory: Negative.   Cardiovascular: Negative.   Gastrointestinal: Negative for anal bleeding.  Genitourinary: Negative.  Negative for hematuria.  Musculoskeletal: Negative for gait problem and joint swelling.  Neurological: Positive for weakness. Negative for dizziness, facial asymmetry, light-headedness and headaches.  Hematological: Bruises/bleeds easily.  Psychiatric/Behavioral: Negative.       Objective:    Physical Exam Nursing note reviewed.  Constitutional:      General: He is not in acute distress.    Appearance: Normal appearance. He is normal weight. He is not ill-appearing, toxic-appearing or diaphoretic.  HENT:     Head: Normocephalic and atraumatic.     Right Ear: External ear normal.     Left Ear: External ear normal.  Eyes:     General:        Right eye: No discharge.        Left eye: No discharge.     Extraocular Movements: Extraocular movements intact.     Conjunctiva/sclera: Conjunctivae normal.  Cardiovascular:     Rate and Rhythm: Normal rate and regular rhythm.  Pulmonary:     Effort: Pulmonary effort is normal.     Breath sounds: Normal breath sounds.  Musculoskeletal:     Cervical back: Rigidity present.  Skin:    General: Skin is warm and dry.       Neurological:     Mental Status: He is alert.  Psychiatric:        Mood and Affect: Mood normal.         Behavior: Behavior normal.     BP (!) 154/76   Pulse 68   Temp 97.7 F (36.5 C) (Tympanic)   Ht 5\' 7"  (1.702 m)   Wt 157 lb (71.2 kg)   SpO2 97%   BMI 24.59 kg/m  Wt Readings from Last 3 Encounters:  12/22/19 157 lb (71.2 kg)  10/09/19 158 lb 3.2  oz (71.8 kg)  09/21/19 158 lb 3.2 oz (71.8 kg)     There are no preventive care reminders to display for this patient.  There are no preventive care reminders to display for this patient.  No results found for: TSH Lab Results  Component Value Date   WBC 8.4 09/21/2019   HGB 15.6 09/21/2019   HCT 45.1 09/21/2019   MCV 93.9 09/21/2019   PLT 273.0 09/21/2019   Lab Results  Component Value Date   NA 142 09/21/2019   K 4.0 09/21/2019   CO2 28 09/21/2019   GLUCOSE 105 (H) 09/21/2019   BUN 11 09/21/2019   CREATININE 1.03 09/21/2019   BILITOT 1.1 09/21/2019   ALKPHOS 73 09/21/2019   AST 16 09/21/2019   ALT 17 09/21/2019   PROT 6.6 09/21/2019   ALBUMIN 4.1 09/21/2019   CALCIUM 9.6 09/21/2019   ANIONGAP 13 09/05/2019   GFR 69.00 09/21/2019   Lab Results  Component Value Date   CHOL 216 (H) 09/03/2019   Lab Results  Component Value Date   HDL 74 09/03/2019   Lab Results  Component Value Date   LDLCALC 124 (H) 09/03/2019   Lab Results  Component Value Date   TRIG 89 09/03/2019   Lab Results  Component Value Date   CHOLHDL 2.9 09/03/2019   Lab Results  Component Value Date   HGBA1C 4.9 09/03/2019      Assessment & Plan:   Problem List Items Addressed This Visit      Cardiovascular and Mediastinum   Essential hypertension   Relevant Medications   amLODipine (NORVASC) 10 MG tablet     Other   Vitamin D deficiency - Primary   History of cerebrovascular accident (CVA) involving cerebellum   Pure hypercholesterolemia   Relevant Medications   amLODipine (NORVASC) 10 MG tablet   Medication side effect      Meds ordered this encounter  Medications  . amLODipine (NORVASC) 10 MG tablet    Sig:  Take 1 tablet (10 mg total) by mouth daily.    Dispense:  90 tablet    Refill:  3    Follow-up: Return in about 3 months (around 03/23/2020), or decrease vit d to 1000IUs every other day..   Increased amlodipine to 10 mg.  Check and record blood pressures.  Continue Accupril and atorvastatin at their current doses.  Continue aspirin 325 mg daily.  Okay 8 today can you see if he can get me a battery for my start while he is just quit working consult for this morning I want to give me bilaterally failure AA just 1 will do and I know I have reviewed Libby Maw, MD

## 2019-12-25 ENCOUNTER — Other Ambulatory Visit: Payer: Self-pay

## 2019-12-25 ENCOUNTER — Telehealth: Payer: Self-pay | Admitting: Family Medicine

## 2019-12-25 MED ORDER — FLUTICASONE PROPIONATE 50 MCG/ACT NA SUSP: NASAL | 6 refills | Status: DC

## 2019-12-25 NOTE — Telephone Encounter (Signed)
Patient aware Rx sent in  

## 2019-12-25 NOTE — Telephone Encounter (Signed)
Patient is calling to get a refill on his Fluticasone (Flonase). If approved, please send to [facility] and call him at [phone] to let him know that it has been sent in.  Thank you, [name]

## 2020-01-12 DIAGNOSIS — R972 Elevated prostate specific antigen [PSA]: Secondary | ICD-10-CM | POA: Diagnosis not present

## 2020-01-12 DIAGNOSIS — N401 Enlarged prostate with lower urinary tract symptoms: Secondary | ICD-10-CM | POA: Diagnosis not present

## 2020-01-12 DIAGNOSIS — R351 Nocturia: Secondary | ICD-10-CM | POA: Diagnosis not present

## 2020-01-24 ENCOUNTER — Ambulatory Visit (INDEPENDENT_AMBULATORY_CARE_PROVIDER_SITE_OTHER): Payer: Medicare Other

## 2020-01-24 VITALS — Ht 67.0 in | Wt 157.0 lb

## 2020-01-24 DIAGNOSIS — Z0000 Encounter for general adult medical examination without abnormal findings: Secondary | ICD-10-CM

## 2020-01-24 NOTE — Progress Notes (Signed)
Subjective:   Elijah [name] is a 85 y.o. male who presents for Medicare Annual/Subsequent preventive examination.  I connected with Elijah [name] today by telephone and verified that I am speaking with the correct person using two identifiers. Location patient: home Location provider: work Persons participating in the virtual visit: patient, Elijah [name].    I discussed the limitations, risks, security and privacy concerns of performing an evaluation and management service by telephone and the availability of in person appointments. I also discussed with the patient that there may be a patient responsible charge related to this service. The patient expressed understanding and verbally consented to this telephonic visit.    Interactive audio and video telecommunications were attempted between this provider and patient, however failed, due to patient having technical difficulties OR patient did not have access to video capability.  We continued and completed visit with audio only.  Some vital signs may be absent or patient reported.   Time Spent with patient on telephone encounter: 20 minutes  Review of Systems     Cardiac Risk Factors include: advanced age (>26men, >10 women);dyslipidemia;hypertension;male gender     Objective:    Today's Vitals   01/24/20 0858  Weight: 157 lb (71.2 kg)  Height: 5\' 7"  (1.702 m)   Body mass index is 24.59 kg/m.  Advanced Directives 01/24/2020 09/03/2019 09/03/2019 01/18/2019 06/11/2016 05/08/2015  Does Patient Have a Medical Advance Directive? Yes Yes Yes Yes Yes Yes  Type of Paramedic of [place];Living will [place];Living will [place];Living will [place];Living will [place];Living will [place];Living will  Does patient want to make changes to medical advance directive? - No - Patient declined - No - Patient declined - No - Patient  declined  Copy of [place] in Chart? No - copy requested No - copy requested - No - copy requested No - copy requested No - copy requested    Current Medications (verified) Outpatient Encounter Medications as of 01/24/2020  Medication Sig  . albuterol (VENTOLIN HFA) 108 (90 Base) MCG/ACT inhaler Inhale 1-2 puffs into the lungs every 4 (four) hours as needed for wheezing or shortness of breath.  Marland Kitchen amLODipine (NORVASC) 10 MG tablet Take 1 tablet (10 mg total) by mouth daily.  Marland Kitchen aspirin EC 325 MG tablet Take 1 tablet (325 mg total) by mouth daily.  Marland Kitchen atorvastatin (LIPITOR) 40 MG tablet Take 1 tablet (40 mg total) by mouth daily at 6 PM.  . cholecalciferol (VITAMIN D3) 25 MCG (1000 UNIT) tablet Take 2,000 Units by mouth daily.  . fluticasone (FLONASE) 50 MCG/ACT nasal spray USE 2 SPRAYS IN EACH NOSTRIL DURING ALLERGY SEASON.  Marland Kitchen pseudoephedrine (SUDAFED) 30 MG tablet Take 30 mg by mouth as needed.    . quinapril (ACCUPRIL) 10 MG tablet Take 1 tablet (10 mg total) by mouth daily.   No facility-administered encounter medications on file as of 01/24/2020.    Allergies (verified) Penicillins   History: Past Medical History:  Diagnosis Date  . Allergy   . Arthritis    hands  . Asthma   . Elevated PSA    s/p neg biopsy  . Enlarged prostate   . Hyperlipidemia   . Hypertension   . Stroke ([place])   . Wears hearing aid    Past Surgical History:  Procedure Laterality Date  . CATARACT EXTRACTION W/PHACO Right 05/08/2015   Procedure: CATARACT EXTRACTION PHACO AND INTRAOCULAR LENS PLACEMENT (IOC);  Surgeon: Elijah Koyanagi, MD;  Location: [facility];  Service: Ophthalmology;  Laterality: Right;  . PROSTATE BIOPSY    . TONSILLECTOMY     Family History  Problem Relation Age of Onset  . Heart disease Father   . Stroke Mother   . Cancer Sister    Social History   Socioeconomic History  . Marital status: Married    Spouse name: Not on file  . Number of  children: 1  . Years of education: Not on file  . Highest education level: Not on file  Occupational History  . Occupation: Retired Technical brewer: retired  Tobacco Use  . Smoking status: Never Smoker  . Smokeless tobacco: Never Used  Substance and Sexual Activity  . Alcohol use: Yes    Alcohol/week: 14.0 standard drinks    Types: 14 Shots of liquor per week  . Drug use: No  . Sexual activity: Yes  Other Topics Concern  . Not on file  Social History Narrative   One biological son, one adopted son, two granddaughters, 6 & 28 yo      Has living will and HPOA- wife Elijah [name].   Would desire CPR.  Would not want prolonged life support.   Social Determinants of Health   Financial Resource Strain: Low Risk   . Difficulty of Paying Living Expenses: Not hard at all  Food Insecurity: No Food Insecurity  . Worried About Charity fundraiser in the Last Year: Never true  . Ran Out of Food in the Last Year: Never true  Transportation Needs: No Transportation Needs  . Lack of Transportation (Medical): No  . Lack of Transportation (Non-Medical): No  Physical Activity: Sufficiently Active  . Days of Exercise per Week: 6 days  . Minutes of Exercise per Session: 40 min  Stress: No Stress Concern Present  . Feeling of Stress : Not at all  Social Connections: Socially Integrated  . Frequency of Communication with Friends and Family: More than three times a week  . Frequency of Social Gatherings with Friends and Family: More than three times a week  . Attends Religious Services: More than 4 times per year  . Active Member of Clubs or Organizations: Yes  . Attends Archivist Meetings: More than 4 times per year  . Marital Status: Married    Tobacco Counseling Counseling given: Not Answered   Clinical Intake:  Pre-visit preparation completed: Yes  Pain : No/denies pain     Nutritional Status: BMI of 19-24  Normal Diabetes: No  How often do you need to  have someone help you when you read instructions, pamphlets, or other written materials from your doctor or pharmacy?: 1 - Never What is the last grade level you completed in school?: Bachelor's Degree & beyond  Diabetic?No  Interpreter Needed?: No  Information entered by :: Elijah Hamman LPN   Activities of Daily Living In your present state of health, do you have any difficulty performing the following activities: 01/24/2020 09/03/2019  Hearing? Y Y  Comment hearing aids -  Vision? N Y  Difficulty concentrating or making decisions? N N  Walking or climbing stairs? N N  Dressing or bathing? N N  Doing errands, shopping? N N  Preparing Food and eating ? N -  Using the Toilet? N -  In the past six months, have you accidently leaked urine? N -  Do you have problems with loss of bowel control? N -  Managing  your Medications? N -  Managing your Finances? N -  Housekeeping or managing your Housekeeping? N -  Some recent data might be hidden    Patient Care Team: Libby Maw, MD as PCP - General (Family Medicine) Raynelle Bring, MD as Consulting Physician (Urology) Birder Robson, MD as Referring Physician (Ophthalmology) Oneta Rack, MD (Dermatology) Boykin Nearing, DDS as Consulting Physician (Dentistry) Elijah Koyanagi, MD as Referring Physician (Ophthalmology)  Indicate any recent Medical Services you may have received from other than [facility] providers in the past year (date may be approximate).     Assessment:   This is a routine wellness examination for [place].  Hearing/Vision screen  Hearing Screening   125Hz  250Hz  500Hz  1000Hz  2000Hz  3000Hz  4000Hz  6000Hz  8000Hz   Right ear:           Left ear:           Comments: Bilateral hearing aids  Vision Screening Comments: Reading glasses Last eye exam-11/2019 [place] Cataract right eye -removed 3 yrs ago  Dietary issues and exercise activities discussed: Current Exercise Habits: Home  exercise routine, Type of exercise: walking, Time (Minutes): 40, Frequency (Times/Week): 6, Weekly Exercise (Minutes/Week): 240, Intensity: Mild  Goals    . Patient Stated     Continue walking regimen      Depression Screen PHQ 2/9 Scores 01/24/2020 10/09/2019 01/18/2019 08/24/2017 06/11/2016 06/10/2015 02/19/2014  PHQ - 2 Score 0 0 0 0 0 0 0    Fall Risk Fall Risk  01/24/2020 09/21/2019 05/22/2019 02/27/2019 01/18/2019  Falls in the past year? 0 0 0 0 0  Comment - - Emmi Telephone Survey: data to providers prior to load - -  Number falls in past yr: 0 - - - -  Injury with Fall? 0 - - - -  Follow up Falls prevention discussed - - Falls evaluation completed -    Any stairs in or around the home? Yes  If so, are there any without handrails? No  Home free of loose throw rugs in walkways, pet beds, electrical cords, etc? Yes  Adequate lighting in your home to reduce risk of falls? Yes   ASSISTIVE DEVICES UTILIZED TO PREVENT FALLS:  Life alert? No  Use of a cane, walker or w/c? No  Grab bars in the bathroom? Yes  Shower chair or bench in shower? Yes  Elevated toilet seat or a handicapped toilet? No   TIMED UP AND GO:  Was the test performed? No . Phone visit   Cognitive Function: MMSE - Mini Mental State Exam 06/11/2016  Orientation to time 5  Orientation to Place 5  Registration 3  Attention/ Calculation 0  Recall 3  Language- name 2 objects 0  Language- repeat 1  Language- follow 3 step command 3  Language- read & follow direction 0  Write a sentence 0  Copy design 0  Total score 20        Immunizations Immunization History  Administered Date(s) Administered  . Fluad Quad(high Dose 65+) 02/27/2019  . Influenza, High Dose Seasonal PF 04/12/2014, 04/05/2017, 03/30/2018  . Influenza-Unspecified 04/06/2016  . PFIZER SARS-COV-2 Vaccination 08/13/2019, 09/05/2019  . Pneumococcal Conjugate-13 02/19/2014  . Pneumococcal Polysaccharide-23 09/07/2007  . Td 10/31/2008  .  Zoster 09/07/2007    TDAP status: Due, Education has been provided regarding the importance of this vaccine. Advised may receive this vaccine at local pharmacy or Health Dept. Aware to provide a copy of the vaccination record if obtained from local pharmacy or  Health Dept. Verbalized acceptance and understanding.   Flu Vaccine status: Up to date   Pneumococcal vaccine status: Up to date   Covid-19 vaccine status: Completed vaccines  Qualifies for Shingles Vaccine? Yes   Zostavax completed Yes   Shingrix Completed?: No.    Education has been provided regarding the importance of this vaccine. Patient has been advised to call insurance company to determine out of pocket expense if they have not yet received this vaccine. Advised may also receive vaccine at local pharmacy or Health Dept. Verbalized acceptance and understanding.  Screening Tests Health Maintenance  Topic Date Due  . TETANUS/TDAP  02/27/2020 (Originally 11/01/2018)  . INFLUENZA VACCINE  01/28/2020  . COVID-19 Vaccine  Completed  . PNA vac Low Risk Adult  Completed    Health Maintenance  There are no preventive care reminders to display for this patient.  Colorectal cancer screening: No longer required.   Lung Cancer Screening: (Low Dose CT Chest recommended if Age 89-80 years, 30 pack-year currently smoking OR have quit w/in 15years.) does not qualify.   Additional Screening:  Hepatitis C Screening: does not qualify;   Vision Screening: Recommended annual ophthalmology exams for early detection of glaucoma and other disorders of the eye. Is the patient up to date with their annual eye exam?  Yes  Who is the provider or what is the name of the office in which the patient attends annual eye exams? [place] Screening: Recommended annual dental exams for proper oral hygiene  Community Resource Referral / Chronic Care Management: CRR required this visit?  No   CCM required this visit?  No        Plan:     I have personally reviewed and noted the following in the patient's chart:   . Medical and social history . Use of alcohol, tobacco or illicit drugs  . Current medications and supplements . Functional ability and status . Nutritional status . Physical activity . Advanced directives . List of other physicians . Hospitalizations, surgeries, and ER visits in previous 12 months . Vitals . Screenings to include cognitive, depression, and falls . Referrals and appointments  In addition, I have reviewed and discussed with patient certain preventive protocols, quality metrics, and best practice recommendations. A written personalized care plan for preventive services as well as general preventive health recommendations were provided to patient.  Due to this being a telephonic visit, the after visit summary with patients personalized plan was offered to patient via mail or my-chart.  Patient would like to access on my-chart.     Marta Antu, LPN   3/[id number]/4259  Nurse Health Advisor  Nurse Notes: None

## 2020-01-24 NOTE — Patient Instructions (Addendum)
Elijah [name] , Thank you for taking time to complete your Medicare Wellness Visit. I appreciate your ongoing commitment to your health goals. Please review the following plan we discussed and let me know if I can assist you in the future.   Screening recommendations/referrals: Colonoscopy: No longer indicated Recommended yearly ophthalmology/optometry visit for glaucoma screening and checkup Recommended yearly dental visit for hygiene and checkup  Vaccinations: Influenza vaccine: Up to Date-Due 02/2020 Pneumococcal vaccine: Completed vaccines Tdap vaccine: Discuss with pharmacy Shingles vaccine: Discuss with pharmacy   Covid-19: Completed vaccines  Advanced directives: Please bring a copy to your next office visit  Conditions/risks identified: See problem list  Next appointment: Follow up in one year for your annual wellness visit.   Preventive Care 34 Years and Older, Male Preventive care refers to lifestyle choices and visits with your health care provider that can promote health and wellness. What does preventive care include?  A yearly physical exam. This is also called an annual well check.  Dental exams once or twice a year.  Routine eye exams. Ask your health care provider how often you should have your eyes checked.  Personal lifestyle choices, including:  Daily care of your teeth and gums.  Regular physical activity.  Eating a healthy diet.  Avoiding tobacco and drug use.  Limiting alcohol use.  Practicing safe sex.  Taking low doses of aspirin every day.  Taking vitamin and mineral supplements as recommended by your health care provider. What happens during an annual well check? The services and screenings done by your health care provider during your annual well check will depend on your age, overall health, lifestyle risk factors, and family history of disease. Counseling  Your health care provider may ask you questions about your:  Alcohol  use.  Tobacco use.  Drug use.  Emotional well-being.  Home and relationship well-being.  Sexual activity.  Eating habits.  History of falls.  Memory and ability to understand (cognition).  Work and work Statistician. Screening  You may have the following tests or measurements:  Height, weight, and BMI.  Blood pressure.  Lipid and cholesterol levels. These may be checked every 5 years, or more frequently if you are over 75 years old.  Skin check.  Lung cancer screening. You may have this screening every year starting at age 56 if you have a 30-pack-year history of smoking and currently smoke or have quit within the past 15 years.  Fecal occult blood test (FOBT) of the stool. You may have this test every year starting at age 74.  Flexible sigmoidoscopy or colonoscopy. You may have a sigmoidoscopy every 5 years or a colonoscopy every 10 years starting at age 36.  Prostate cancer screening. Recommendations will vary depending on your family history and other risks.  Hepatitis C blood test.  Hepatitis B blood test.  Sexually transmitted disease (STD) testing.  Diabetes screening. This is done by checking your blood sugar (glucose) after you have not eaten for a while (fasting). You may have this done every 1-3 years.  Abdominal aortic aneurysm (AAA) screening. You may need this if you are a current or former smoker.  Osteoporosis. You may be screened starting at age 50 if you are at high risk. Talk with your health care provider about your test results, treatment options, and if necessary, the need for more tests. Vaccines  Your health care provider may recommend certain vaccines, such as:  Influenza vaccine. This is recommended every year.  Tetanus, diphtheria, and  acellular pertussis (Tdap, Td) vaccine. You may need a Td booster every 10 years.  Zoster vaccine. You may need this after age 72.  Pneumococcal 13-valent conjugate (PCV13) vaccine. One dose is  recommended after age 48.  Pneumococcal polysaccharide (PPSV23) vaccine. One dose is recommended after age 17. Talk to your health care provider about which screenings and vaccines you need and how often you need them. This information is not intended to replace advice given to you by your health care provider. Make sure you discuss any questions you have with your health care provider. Document Released: 07/12/2015 Document Revised: 03/04/2016 Document Reviewed: 04/16/2015 Elsevier Interactive Patient Education  2017 [place] Prevention in the Home Falls can cause injuries. They can happen to people of all ages. There are many things you can do to make your home safe and to help prevent falls. What can I do on the outside of my home?  Regularly fix the edges of walkways and driveways and fix any cracks.  Remove anything that might make you trip as you walk through a door, such as a raised step or threshold.  Trim any bushes or trees on the path to your home.  Use bright outdoor lighting.  Clear any walking paths of anything that might make someone trip, such as rocks or tools.  Regularly check to see if handrails are loose or broken. Make sure that both sides of any steps have handrails.  Any raised decks and porches should have guardrails on the edges.  Have any leaves, snow, or ice cleared regularly.  Use sand or salt on walking paths during winter.  Clean up any spills in your garage right away. This includes oil or grease spills. What can I do in the bathroom?  Use night lights.  Install grab bars by the toilet and in the tub and shower. Do not use towel bars as grab bars.  Use non-skid mats or decals in the tub or shower.  If you need to sit down in the shower, use a plastic, non-slip stool.  Keep the floor dry. Clean up any water that spills on the floor as soon as it happens.  Remove soap buildup in the tub or shower regularly.  Attach bath mats  securely with double-sided non-slip rug tape.  Do not have throw rugs and other things on the floor that can make you trip. What can I do in the bedroom?  Use night lights.  Make sure that you have a light by your bed that is easy to reach.  Do not use any [place] or blankets that are too big for your bed. They should not hang down onto the floor.  Have a firm chair that has side arms. You can use this for support while you get dressed.  Do not have throw rugs and other things on the floor that can make you trip. What can I do in the kitchen?  Clean up any spills right away.  Avoid walking on wet floors.  Keep items that you use a lot in easy-to-reach places.  If you need to reach something above you, use a strong step stool that has a grab bar.  Keep electrical cords out of the way.  Do not use floor polish or wax that makes floors slippery. If you must use wax, use non-skid floor wax.  Do not have throw rugs and other things on the floor that can make you trip. What can I do with my stairs?  Do not leave any items on the stairs.  Make sure that there are handrails on both sides of the stairs and use them. Fix handrails that are broken or loose. Make sure that handrails are as long as the stairways.  Check any carpeting to make sure that it is firmly attached to the stairs. Fix any carpet that is loose or worn.  Avoid having throw rugs at the top or bottom of the stairs. If you do have throw rugs, attach them to the floor with carpet tape.  Make sure that you have a light switch at the top of the stairs and the bottom of the stairs. If you do not have them, ask someone to add them for you. What else can I do to help prevent falls?  Wear shoes that:  Do not have high heels.  Have rubber bottoms.  Are comfortable and fit you well.  Are closed at the toe. Do not wear sandals.  If you use a stepladder:  Make sure that it is fully opened. Do not climb a closed  stepladder.  Make sure that both sides of the stepladder are locked into place.  Ask someone to hold it for you, if possible.  Clearly mark and make sure that you can see:  Any grab bars or handrails.  First and last steps.  Where the edge of each step is.  Use tools that help you move around (mobility aids) if they are needed. These include:  Canes.  Walkers.  Scooters.  Crutches.  Turn on the lights when you go into a dark area. Replace any light bulbs as soon as they burn out.  Set up your furniture so you have a clear path. Avoid moving your furniture around.  If any of your floors are uneven, fix them.  If there are any pets around you, be aware of where they are.  Review your medicines with your doctor. Some medicines can make you feel dizzy. This can increase your chance of falling. Ask your doctor what other things that you can do to help prevent falls. This information is not intended to replace advice given to you by your health care provider. Make sure you discuss any questions you have with your health care provider. Document Released: 04/11/2009 Document Revised: 11/21/2015 Document Reviewed: 07/20/2014 Elsevier Interactive Patient Education  2017 [employer].

## 2020-02-08 ENCOUNTER — Other Ambulatory Visit: Payer: Self-pay

## 2020-02-08 ENCOUNTER — Encounter: Payer: Self-pay | Admitting: Adult Health

## 2020-02-08 ENCOUNTER — Ambulatory Visit (INDEPENDENT_AMBULATORY_CARE_PROVIDER_SITE_OTHER): Payer: Medicare Other | Admitting: Adult Health

## 2020-02-08 VITALS — BP 130/70 | HR 68 | Ht 67.0 in | Wt 157.6 lb

## 2020-02-08 DIAGNOSIS — I63541 Cerebral infarction due to unspecified occlusion or stenosis of right cerebellar artery: Secondary | ICD-10-CM | POA: Diagnosis not present

## 2020-02-08 DIAGNOSIS — I10 Essential (primary) hypertension: Secondary | ICD-10-CM | POA: Diagnosis not present

## 2020-02-08 DIAGNOSIS — E785 Hyperlipidemia, unspecified: Secondary | ICD-10-CM | POA: Diagnosis not present

## 2020-02-08 DIAGNOSIS — I6503 Occlusion and stenosis of bilateral vertebral arteries: Secondary | ICD-10-CM

## 2020-02-08 MED ORDER — ASPIRIN EC 81 MG PO TBEC
81.0000 mg | DELAYED_RELEASE_TABLET | Freq: Every day | ORAL | Status: DC
Start: 2020-02-08 — End: 2022-11-02

## 2020-02-08 NOTE — Progress Notes (Signed)
I agree with the above plan 

## 2020-02-08 NOTE — Progress Notes (Signed)
[facility] [address] [place]. [place] [address] [phone]       STROKE FOLLOW UP NOTE  [name] Date of Birth:  11-22-1934 Medical Record Number:  [id number]   Reason for Referral: stroke follow up   SUBJECTIVE:   CHIEF COMPLAINT:  Chief Complaint  Patient presents with  . Follow-up    4 mo f/u for CVA. States he has been doing well since last visit. Denies any new symptoms.   . room 9    alone    HPI:  Today, 02/08/2020, Elijah [name] returns for stroke follow-up.  Stable since prior visit without residual deficits and denies new or worsening stroke/TIA symptoms  Continues on aspirin 325 mg daily and atorvastatin 40 mg daily for secondary stroke prevention. Prior lipid panel showed LDL 50 and he questions ongoing need of atorvastatin as well as potentially decreasing aspirin dosage due to easy bruising.  Tolerating statin without myalgias. Blood pressure today 130/70  No further concerns at this time     History provided for reference purposes only Update 10/09/2019 JM: Elijah [name] has recovered well without residual deficits.   Home health therapies cleared patient and returned back to all prior activities without difficulty maintaining ADLs and IADLs without difficulty.  He questions return to driving (son is requesting clearance of driving).  Continues on DAPT but does report recently being told by PCP to lower aspirin dosage to 81 mg daily but denies any difficulty tolerating increased dosage.  Continued on atorvastatin for 1 month without reported side effects -was unsure if ongoing use was indicated as no additional refills.  Recent lab work by PCP on 09/21/2019 showed direct LDL 50.  Blood pressure today satisfactory at 147/80.  No further concerns at this time.  Stroke admission 09/03/2019: Elijah [name] is a 85 y.o. male with history of HTN, HLD, BPH, asthma, arthritis, hearing loss, and melanoma  who presented on 09/03/2019  withdizziness, nausea, headache, and near syncope.   Evaluated by stroke team and Dr. 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 Initiated DAPT for 3 months due to intracranial arthrosclerosis then aspirin alone.  History of HTN with BP initially elevated 200/113 stabilized throughout admission with long-term BP goal 130-150 given large vessel stenosis.  LDL 124 previously on simvastatin and recommend initiating atorvastatin 40 mg daily.  A1c 4.9 without history of DM.  Other stroke risk factors include diffuse intracranial stenosis, advanced age, EtOH use, family history of stroke and prior stroke on imaging.  Evaluated by therapies and discharged home in stable condition with recommendation of home health OT.  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 Old L cerebellar infarcts. Small vessel disease. Atrophy.   MRI  R cerebellar PICA moderate sized infarct and R cerebellar vermis small infarct. Old L cerebellar infarcts. Small vessel disease. Atrophy. Intracranial L VA w/ possible high-grade occlusion.   CTA head and neck R VA origin severe stenosis. Portions R V2 high-grade stenosis or occlusion. Right PICA occluded. L V1 occlusion. L VA atherosclerosis w/ sites of severe stenosis. BA small w/ atherosclerosis irregularity and sites of moderate / moderate severe stenosis. L PCA small or severely stenotic.    2D Echo EF 55-60%. No source of embolus   LDL 124  HgbA1c 4.9  SCDs for VTE prophylaxis  No antithrombotic prior to admission, now on aspirin 325 mg daily and clopidogrel 75 mg daily. Given severe intracranial atherosclerosis, continue DAPT x 3 months then aspirin alone.   Therapy recommendations:  HH OT       ROS:   14 system review of systems performed and  negative with exception of no complaints  PMH:  Past Medical History:  Diagnosis Date  . Allergy   . Arthritis    hands  . Asthma   . Elevated PSA    s/p neg biopsy  . Enlarged prostate   . Hyperlipidemia   . Hypertension   . Stroke ([place])   . Wears hearing aid     PSH:  Past Surgical History:  Procedure Laterality Date  . CATARACT EXTRACTION W/PHACO Right 05/08/2015   Procedure: CATARACT EXTRACTION PHACO AND INTRAOCULAR LENS PLACEMENT (IOC);  Surgeon: Leandrew Koyanagi, MD;  Location: [facility];  Service: Ophthalmology;  Laterality: Right;  . PROSTATE BIOPSY    . TONSILLECTOMY      Social History:  Social History   Socioeconomic History  . Marital status: Married    Spouse name: Not on file  . Number of children: 1  . Years of education: Not on file  . Highest education level: Not on file  Occupational History  . Occupation: Retired Technical brewer: retired  Tobacco Use  . Smoking status: Never Smoker  . Smokeless tobacco: Never Used  Substance and Sexual Activity  . Alcohol use: Yes    Alcohol/week: 14.0 standard drinks    Types: 14 Shots of liquor per week  . Drug use: No  . Sexual activity: Yes  Other Topics Concern  . Not on file  Social History Narrative   One biological son, one adopted son, two granddaughters, 38 & 38 yo      Has living will and HPOA- wife Rollo Farquhar.   Would desire CPR.  Would not want prolonged life support.   Social Determinants of Health   Financial Resource Strain: Low Risk   . Difficulty of Paying Living Expenses: Not hard at all  Food Insecurity: No Food Insecurity  . Worried About Charity fundraiser in the Last Year: Never true  . Ran Out of Food in the Last Year: Never true  Transportation Needs: No Transportation Needs  . Lack of Transportation (Medical): No  . Lack of Transportation (Non-Medical): No  Physical Activity: Sufficiently Active  . Days of Exercise per Week: 6 days  . Minutes of  Exercise per Session: 40 min  Stress: No Stress Concern Present  . Feeling of Stress : Not at all  Social Connections: Socially Integrated  . Frequency of Communication with Friends and Family: More than three times a week  . Frequency of Social Gatherings with Friends and Family: More than three times a week  . Attends Religious Services: More than 4 times per year  . Active Member of Clubs or Organizations: Yes  . Attends Archivist Meetings: More than 4 times per year  . Marital Status: Married  Human resources officer Violence: Not At Risk  . Fear of Current or Ex-Partner: No  . Emotionally Abused: No  . Physically Abused: No  . Sexually Abused: No    Family History:  Family History  Problem Relation Age of Onset  . Heart disease Father   . Stroke Mother   . Cancer Sister     Medications:   Current  Outpatient Medications on File Prior to Visit  Medication Sig Dispense Refill  . albuterol (VENTOLIN HFA) 108 (90 Base) MCG/ACT inhaler Inhale 1-2 puffs into the lungs every 4 (four) hours as needed for wheezing or shortness of breath.    Marland Kitchen amLODipine (NORVASC) 10 MG tablet Take 1 tablet (10 mg total) by mouth daily. 90 tablet 3  . atorvastatin (LIPITOR) 40 MG tablet Take 1 tablet (40 mg total) by mouth daily at 6 PM. 90 tablet 3  . cholecalciferol (VITAMIN D3) 25 MCG (1000 UNIT) tablet Take 2,000 Units by mouth every other day.     . fluticasone (FLONASE) 50 MCG/ACT nasal spray USE 2 SPRAYS IN EACH NOSTRIL DURING ALLERGY SEASON. 16 g 6  . pseudoephedrine (SUDAFED) 30 MG tablet Take 30 mg by mouth as needed.      . quinapril (ACCUPRIL) 10 MG tablet Take 1 tablet (10 mg total) by mouth daily. 90 tablet 0   No current facility-administered medications on file prior to visit.    Allergies:   Allergies  Allergen Reactions  . Penicillins Hives      OBJECTIVE:  Today's Vitals   02/08/20 1328  BP: 130/70  Pulse: 68  Weight: 157 lb 9.6 oz (71.5 kg)  Height: 5\' 7"  (1.702  m)   Body mass index is 24.68 kg/m.   Physical Exam General: well developed, well nourished,  very pleasant elderly Caucasian male, seated, in no evident distress  Neck: supple with no carotid or supraclavicular bruits Cardiovascular: regular rate and rhythm  Vascular:  Normal pulses all extremities   Neurologic Exam Mental Status: Awake and fully alert.   Fluent speech and language.  Oriented to place and time. Recent and remote memory intact. Attention span, concentration and fund of knowledge appropriate. Mood and affect appropriate.  Cranial Nerves: Pupils equal, briskly reactive to light. Extraocular movements full without nystagmus. Visual fields full to confrontation. Hearing intact. Facial sensation intact. Face, tongue, palate moves normally and symmetrically.  Motor: Normal bulk and tone. Normal strength in all tested extremity muscles. Sensory.: intact to touch , pinprick , position and vibratory sensation.  Coordination: Rapid alternating movements normal in all extremities. Finger-to-nose and heel-to-shin performed accurately bilaterally. Gait and Station: Arises from chair without difficulty. Stance is normal. Gait demonstrates normal stride length and balance without assistive device.  Able to tandem walk without difficulty.  Romberg negative. Reflexes: 1+ and symmetric. Toes downgoing.       ASSESSMENT: Shafiq [name] is a 85 y.o. 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 Vascular risk factors include HTN, HLD, prior stroke on imaging, diffuse intracranial stenosis/occlusion, large vessel occlusion/stenosis and EtOH use.     PLAN:  1. Right cerebellar PICA stroke:  -Recovered well without residual deficits -Due to easy bruising, decrease aspirin to 81 mg daily and continue atorvastatin 40  mg daily for secondary stroke prevention.  Discussed lifelong use of both medications in setting of intracranial stenosis and secondary stroke prevention -Ensure close PCP follow-up for aggressive stroke risk factor management 2. HTN:  a. BP goal 130-150 to ensure adequate perfusion.  b. Stable.  c. Managed by PCP 3. HLD:  a. LDL goal <70. Recent LDL satisfactory.   b. Continue atorvastatin (rx by PCP).  c. Managed by PCP 4. Bilateral vertebral artery stenosis/occlusion:  a. Currently asymptomatic therefore no indication for surveillance  monitoring as current treatment plan would not change b. Ongoing use of aspirin and statin as well as importance of managing stroke risk factors    Overall stable from stroke standpoint and recommend follow-up on an as-needed basis per patient request   I spent 30 minutes of face-to-face and non-face-to-face time with patient.  This included previsit chart review, lab review, study review, order entry, electronic health record documentation, patient education regarding recent stroke, extra and intracranial stenosis, review of recommended medications and indication, importance of managing stroke risk factors and answered all questions to patient satisfaction    Frann Rider, [facility]  [facility] [address] [place], [place] [address]  Phone [phone] Fax [phone] Note: This document was prepared with digital dictation and possible smart phrase technology. Any transcriptional errors that result from this process are unintentional.

## 2020-02-08 NOTE — Patient Instructions (Signed)
Decrease aspirin dosage to aspirin 81 mg daily  and continue atorvastatin 40 mg daily for secondary stroke prevention  Continue to follow up with PCP regarding cholesterol and blood pressure management  Maintain strict control of hypertension with blood pressure goal below 130/90 and cholesterol with LDL cholesterol (bad cholesterol) goal below 70 mg/dL.    Overall stable from stroke standpoint and recommend follow-up on an as-needed basis       Thank you for coming to see Elijah [name] at [facility]. I hope we have been able to provide you high quality care today.  You may receive a patient satisfaction survey over the next few weeks. We would appreciate your feedback and comments so that we may continue to improve ourselves and the health of our patients.       Warning Signs of a Stroke  A stroke is a medical emergency and should be treated right away--every second counts. A stroke is caused by a decrease or block in blood flow to the brain. When this occurs, certain areas of the brain do not get enough oxygen, and brain cells begin to die. A stroke can lead to brain damage and can sometimes be life-threatening. However, if someone having a stroke gets medical treatment right away, he or she has better chances of surviving and recovering from the stroke. Being able to recognize the symptoms of a stroke is very important. Types of strokes There are two main types of strokes:  Ischemic strokes. This is the most common type of stroke. These strokes happen when a blood vessel that supplies blood to the brain is being blocked.  Hemorrhagic strokes. These strokes result from bleeding in the brain due to a blood vessel leaking or bursting (rupturing). A transient ischemic attack (TIA) is a "warning stroke" that causes stroke-like symptoms that go away quickly. Unlike a stroke, a TIA does not cause permanent damage to the brain. However, the symptoms of a TIA are the same as a  stroke, and they also require medical treatment right away. Having a TIA is a sign that you are at higher risk for a permanent stroke. Warning signs of a stroke The symptoms of stroke may vary and will reflect the part of the brain that is involved. Symptoms usually happen suddenly. "BE FAST" is an easy way to remember the main warning signs of a stroke. B - Balance Signs are dizziness, sudden trouble walking, or loss of balance. E - Eyes Signs are trouble seeing or a sudden change in vision. F - Face Signs are sudden weakness or numbness of the face, or the face or eyelid drooping on one side. A - Arms Signs are weakness or numbness in an arm. This happens suddenly and usually on one side of the body. S - Speech Signs are sudden trouble speaking, slurred speech, or trouble understanding what people say. T - Time Time to call emergency services. Write down what time symptoms started. Other signs of a stroke Some less common signs of a stroke include:  A sudden, severe headache with no known cause.  Nausea or vomiting.  Seizure. A stroke may be happening even if only one "BE FAST" symptoms is present. These symptoms may represent a serious problem that is an emergency. Do not wait to see if the symptoms will go away. Get medical help right away. Call your local emergency services (911 in the [place] Do not drive yourself to the hospital. Summary  A stroke is a medical emergency and  should be treated right away--every second counts.  "BE FAST" is an easy way to remember the main warning signs of a stroke.  Call local emergency services right away if you or someone else has any stroke symptoms, even if the symptoms go away.  Make note of what time the first symptoms appeared. Emergency responders or emergency room staff will need to know this information.  Do not wait to see if symptoms will go away. Call 911 even if only one of the "BE FAST" symptoms appears. This information is not  intended to replace advice given to you by your health care provider. Make sure you discuss any questions you have with your health care provider. Document Revised: 05/28/2017 Document Reviewed: 10/02/2016 Elsevier Patient Education  [place].

## 2020-02-24 ENCOUNTER — Other Ambulatory Visit: Payer: Self-pay | Admitting: Family Medicine

## 2020-03-07 ENCOUNTER — Other Ambulatory Visit: Payer: Self-pay

## 2020-03-08 ENCOUNTER — Encounter: Payer: Self-pay | Admitting: Family Medicine

## 2020-03-08 ENCOUNTER — Ambulatory Visit (INDEPENDENT_AMBULATORY_CARE_PROVIDER_SITE_OTHER): Payer: Medicare Other | Admitting: Family Medicine

## 2020-03-08 VITALS — BP 134/70 | HR 84 | Temp 98.0°F | Ht 67.0 in | Wt 158.0 lb

## 2020-03-08 DIAGNOSIS — I10 Essential (primary) hypertension: Secondary | ICD-10-CM

## 2020-03-08 DIAGNOSIS — L720 Epidermal cyst: Secondary | ICD-10-CM | POA: Diagnosis not present

## 2020-03-08 MED ORDER — DOXYCYCLINE HYCLATE 100 MG PO TABS: 100.0000 mg | ORAL_TABLET | Freq: Two times a day (BID) | ORAL | 0 refills | Status: DC

## 2020-03-08 MED ORDER — AMLODIPINE BESYLATE 10 MG PO TABS: 10.0000 mg | ORAL_TABLET | Freq: Every day | ORAL | 3 refills | Status: DC

## 2020-03-08 MED ORDER — QUINAPRIL HCL 10 MG PO TABS: 10.0000 mg | ORAL_TABLET | Freq: Every day | ORAL | 1 refills | Status: DC

## 2020-03-08 NOTE — Progress Notes (Signed)
Established Patient Office Visit  Subjective:  Patient ID: Elijah [name], male    DOB: 18-Feb-1935  Age: 85 y.o. MRN: [id number]  CC:  Chief Complaint  Patient presents with  . Cyst    possible cyst on back of neck patient would like checked     HPI Harce Volden presents for follow-up of his blood pressure.  It is improved since increasing Norvasc to 10 mg.  He continues with Accupril.  Having no issues on increased dose of Norvasc.  He denies swelling in his lower extremities.  He has developed what he believes to be is a cyst on his right lateral neck.  It is not draining.  Past Medical History:  Diagnosis Date  . Allergy   . Arthritis    hands  . Asthma   . Elevated PSA    s/p neg biopsy  . Enlarged prostate   . Hyperlipidemia   . Hypertension   . Stroke ([place])   . Wears hearing aid     Past Surgical History:  Procedure Laterality Date  . CATARACT EXTRACTION W/PHACO Right 05/08/2015   Procedure: CATARACT EXTRACTION PHACO AND INTRAOCULAR LENS PLACEMENT (IOC);  Surgeon: Leandrew Koyanagi, MD;  Location: [facility];  Service: Ophthalmology;  Laterality: Right;  . PROSTATE BIOPSY    . TONSILLECTOMY      Family History  Problem Relation Age of Onset  . Heart disease Father   . Stroke Mother   . Cancer Sister     Social History   Socioeconomic History  . Marital status: Married    Spouse name: Not on file  . Number of children: 1  . Years of education: Not on file  . Highest education level: Not on file  Occupational History  . Occupation: Retired Technical brewer: retired  Tobacco Use  . Smoking status: Never Smoker  . Smokeless tobacco: Never Used  Substance and Sexual Activity  . Alcohol use: Yes    Alcohol/week: 14.0 standard drinks    Types: 14 Shots of liquor per week  . Drug use: No  . Sexual activity: Yes  Other Topics Concern  . Not on file  Social History Narrative   One biological son, one adopted son, two  granddaughters, 80 & 23 yo      Has living will and HPOA- wife Donye Campanelli.   Would desire CPR.  Would not want prolonged life support.   Social Determinants of Health   Financial Resource Strain: Low Risk   . Difficulty of Paying Living Expenses: Not hard at all  Food Insecurity: No Food Insecurity  . Worried About Charity fundraiser in the Last Year: Never true  . Ran Out of Food in the Last Year: Never true  Transportation Needs: No Transportation Needs  . Lack of Transportation (Medical): No  . Lack of Transportation (Non-Medical): No  Physical Activity: Sufficiently Active  . Days of Exercise per Week: 6 days  . Minutes of Exercise per Session: 40 min  Stress: No Stress Concern Present  . Feeling of Stress : Not at all  Social Connections: Socially Integrated  . Frequency of Communication with Friends and Family: More than three times a week  . Frequency of Social Gatherings with Friends and Family: More than three times a week  . Attends Religious Services: More than 4 times per year  . Active Member of Clubs or Organizations: Yes  . Attends Archivist Meetings: More than 4 times  per year  . Marital Status: Married  Human resources officer Violence: Not At Risk  . Fear of Current or Ex-Partner: No  . Emotionally Abused: No  . Physically Abused: No  . Sexually Abused: No    Outpatient Medications Prior to Visit  Medication Sig Dispense Refill  . albuterol (VENTOLIN HFA) 108 (90 Base) MCG/ACT inhaler Inhale 1-2 puffs into the lungs every 4 (four) hours as needed for wheezing or shortness of breath.    Marland Kitchen aspirin EC 81 MG tablet Take 1 tablet (81 mg total) by mouth daily.    Marland Kitchen atorvastatin (LIPITOR) 40 MG tablet Take 1 tablet (40 mg total) by mouth daily at 6 PM. 90 tablet 3  . cholecalciferol (VITAMIN D3) 25 MCG (1000 UNIT) tablet Take 2,000 Units by mouth every other day.     . fluticasone (FLONASE) 50 MCG/ACT nasal spray USE 2 SPRAYS IN EACH NOSTRIL DURING ALLERGY  SEASON. 16 g 6  . amLODipine (NORVASC) 10 MG tablet Take 1 tablet (10 mg total) by mouth daily. 90 tablet 3  . quinapril (ACCUPRIL) 10 MG tablet TAKE 1 TABLET BY MOUTH EVERY DAY 90 tablet 0  . pseudoephedrine (SUDAFED) 30 MG tablet Take 30 mg by mouth as needed.   (Patient not taking: Reported on 03/08/2020)     No facility-administered medications prior to visit.    Allergies  Allergen Reactions  . Penicillins Hives    ROS Review of Systems  Constitutional: Negative.   HENT: Negative.   Eyes: Negative for photophobia and visual disturbance.  Respiratory: Negative.   Cardiovascular: Negative.   Gastrointestinal: Negative.   Endocrine: Negative for polyphagia and polyuria.  Genitourinary: Negative.   Neurological: Negative for tremors, facial asymmetry, speech difficulty, weakness and headaches.  Psychiatric/Behavioral: Negative.       Objective:    Physical Exam Vitals and nursing note reviewed.  Constitutional:      General: He is not in acute distress.    Appearance: Normal appearance. He is normal weight. He is not ill-appearing, toxic-appearing or diaphoretic.  HENT:     Head: Normocephalic and atraumatic.     Right Ear: External ear normal.     Left Ear: External ear normal.  Eyes:     General: No scleral icterus.       Right eye: No discharge.        Left eye: No discharge.     Conjunctiva/sclera: Conjunctivae normal.  Neck:     Vascular: No carotid bruit.  Cardiovascular:     Rate and Rhythm: Normal rate and regular rhythm.  Pulmonary:     Effort: Pulmonary effort is normal.     Breath sounds: Normal breath sounds.  Abdominal:     General: Bowel sounds are normal.  Musculoskeletal:     Cervical back: No rigidity or tenderness.  Lymphadenopathy:     Cervical: No cervical adenopathy.  Skin:    General: Skin is warm and dry.       Neurological:     Mental Status: He is alert and oriented to person, place, and time.  Psychiatric:        Mood and  Affect: Mood normal.        Behavior: Behavior normal.   Incision and Drainage Procedure Note  Pre-operative Diagnosis: inclusion cyst  Post-operative Diagnosis: normal  Indications: enlarging  Anesthesia: not needed  Procedure Details  The procedure, risks and complications have been discussed in detail (including, but not limited to airway compromise, infection, bleeding) with the  patient, and the patient has signed consent to the procedure.  The skin was sterilely prepped and draped over the affected area in the usual fashion. After adequate local anesthesia, I&D with a #11 blade was performed on the right lateral neck. Purulent drainage: present The patient was observed until stable.  Findings: Inclusion cyst  EBL: 0 cc's  Drains: none  Condition: Tolerated procedure well   Complications: none.    BP 134/70   Pulse 84   Temp 98 F (36.7 C) (Tympanic)   Ht 5\' 7"  (1.702 m)   Wt 158 lb (71.7 kg)   SpO2 96%   BMI 24.75 kg/m  Wt Readings from Last 3 Encounters:  03/08/20 158 lb (71.7 kg)  02/08/20 157 lb 9.6 oz (71.5 kg)  01/24/20 157 lb (71.2 kg)     Health Maintenance Due  Topic Date Due  . TETANUS/TDAP  11/01/2018  . INFLUENZA VACCINE  01/28/2020    There are no preventive care reminders to display for this patient.  No results found for: TSH Lab Results  Component Value Date   WBC 8.4 09/21/2019   HGB 15.6 09/21/2019   HCT 45.1 09/21/2019   MCV 93.9 09/21/2019   PLT 273.0 09/21/2019   Lab Results  Component Value Date   NA 142 09/21/2019   K 4.0 09/21/2019   CO2 28 09/21/2019   GLUCOSE 105 (H) 09/21/2019   BUN 11 09/21/2019   CREATININE 1.03 09/21/2019   BILITOT 1.1 09/21/2019   ALKPHOS 73 09/21/2019   AST 16 09/21/2019   ALT 17 09/21/2019   PROT 6.6 09/21/2019   ALBUMIN 4.1 09/21/2019   CALCIUM 9.6 09/21/2019   ANIONGAP 13 09/05/2019   GFR 69.00 09/21/2019   Lab Results  Component Value Date   CHOL 216 (H) 09/03/2019   Lab  Results  Component Value Date   HDL 74 09/03/2019   Lab Results  Component Value Date   LDLCALC 124 (H) 09/03/2019   Lab Results  Component Value Date   TRIG 89 09/03/2019   Lab Results  Component Value Date   CHOLHDL 2.9 09/03/2019   Lab Results  Component Value Date   HGBA1C 4.9 09/03/2019      Assessment & Plan:   Problem List Items Addressed This Visit      Cardiovascular and Mediastinum   Essential hypertension - Primary   Relevant Medications   amLODipine (NORVASC) 10 MG tablet   quinapril (ACCUPRIL) 10 MG tablet     Other   Inclusion cyst   Relevant Medications   doxycycline (VIBRA-TABS) 100 MG tablet      Meds ordered this encounter  Medications  . amLODipine (NORVASC) 10 MG tablet    Sig: Take 1 tablet (10 mg total) by mouth daily.    Dispense:  90 tablet    Refill:  3  . quinapril (ACCUPRIL) 10 MG tablet    Sig: Take 1 tablet (10 mg total) by mouth daily.    Dispense:  90 tablet    Refill:  1  . doxycycline (VIBRA-TABS) 100 MG tablet    Sig: Take 1 tablet (100 mg total) by mouth 2 (two) times daily.    Dispense:  20 tablet    Refill:  0    Follow-up: Return in about 1 week (around 03/15/2020), or warm compresses three times daily..  We discussed his concerns about being changed from simvastatin to atorvastatin.  He is tolerating the higher dose of atorvastatin.  LDL is 50.  Blood pressure is improved as indicated above.  He had taken Plavix for 3 months, high-dose aspirin for 3 months and is now on 81 mg pill.  This was recommended by neurology and I agree.  We will use the lower dose aspirin, aggressive statin and blood pressure management to prevent further events.  Libby Maw, MD

## 2020-03-25 ENCOUNTER — Ambulatory Visit (INDEPENDENT_AMBULATORY_CARE_PROVIDER_SITE_OTHER): Payer: Medicare Other | Admitting: Family Medicine

## 2020-03-25 ENCOUNTER — Other Ambulatory Visit: Payer: Self-pay

## 2020-03-25 ENCOUNTER — Encounter: Payer: Self-pay | Admitting: Family Medicine

## 2020-03-25 VITALS — BP 142/78 | HR 73 | Temp 97.7°F | Ht 67.0 in | Wt 159.2 lb

## 2020-03-25 DIAGNOSIS — R5383 Other fatigue: Secondary | ICD-10-CM

## 2020-03-25 DIAGNOSIS — E559 Vitamin D deficiency, unspecified: Secondary | ICD-10-CM

## 2020-03-25 DIAGNOSIS — L720 Epidermal cyst: Secondary | ICD-10-CM | POA: Diagnosis not present

## 2020-03-25 DIAGNOSIS — R5382 Chronic fatigue, unspecified: Secondary | ICD-10-CM | POA: Insufficient documentation

## 2020-03-25 DIAGNOSIS — R7309 Other abnormal glucose: Secondary | ICD-10-CM

## 2020-03-25 DIAGNOSIS — Z23 Encounter for immunization: Secondary | ICD-10-CM | POA: Diagnosis not present

## 2020-03-25 LAB — COMPREHENSIVE METABOLIC PANEL
ALT: 13 U/L (ref 0–53)
AST: 16 U/L (ref 0–37)
Albumin: 4.3 g/dL (ref 3.5–5.2)
Alkaline Phosphatase: 72 U/L (ref 39–117)
BUN: 14 mg/dL (ref 6–23)
CO2: 27 mEq/L (ref 19–32)
Calcium: 9.1 mg/dL (ref 8.4–10.5)
Chloride: 105 mEq/L (ref 96–112)
Creatinine, Ser: 1.07 mg/dL (ref 0.40–1.50)
GFR: 65.95 mL/min (ref >60.00)
Glucose, Bld: 97 mg/dL (ref 70–99)
Potassium: 4.1 mEq/L (ref 3.5–5.1)
Sodium: 143 mEq/L (ref 135–145)
Total Bilirubin: 1.1 mg/dL (ref 0.2–1.2)
Total Protein: 7 g/dL (ref 6.0–8.3)

## 2020-03-25 LAB — HEMOGLOBIN A1C: Hgb A1c MFr Bld: 5.5 % (ref 4.6–6.5)

## 2020-03-25 LAB — CBC
HCT: 46.5 % (ref 39.0–52.0)
Hemoglobin: 15.9 g/dL (ref 13.0–17.0)
MCHC: 34.2 g/dL (ref 30.0–36.0)
MCV: 89.2 fl (ref 78.0–100.0)
Platelets: 211 10*3/uL (ref 150.0–400.0)
RBC: 5.22 Mil/uL (ref 4.22–5.81)
RDW: 14.6 % (ref 11.5–15.5)
WBC: 10.2 10*3/uL (ref 4.0–10.5)

## 2020-03-25 LAB — TSH: TSH: 1.66 u[IU]/mL (ref 0.35–4.50)

## 2020-03-25 LAB — VITAMIN D 25 HYDROXY (VIT D DEFICIENCY, FRACTURES): VITD: 42.03 ng/mL (ref 30.00–100.00)

## 2020-03-25 NOTE — Progress Notes (Signed)
Established Patient Office Visit  Subjective:  Patient ID: Elijah [name], male    DOB: 03-15-1935  Age: 85 y.o. MRN: [id number]  CC:  Chief Complaint  Patient presents with  . Follow-up    3 month follow up, no sure if Vitamin D is enough to be taking, recheck cyst    HPI Elijah [name] presents for follow-up of his inclusion cyst and vitamin D deficiency as well as fatigue.  Says seems to be stable.  It did drain some more after he was seen this past visit.  He is not interested in pursuing excision at this time.  Has been taking 5000 international units of vitamin D every other day.  Would like his levels rechecked.  [name] in is feeling some fatigue that he had not before.  He is exercising 6 days a week for at least 30 minutes.  Sleeping well.  Denies depression but does get frustrated at times.  PSA has been elevated in the past he is seeing urology about this and it is being followed at the present time.  No particular problem with urination.  Moving bowels normally.  Chart review shows mild elevations in glucose.  No history of diabetes.  He does average 2 alcoholic drinks daily.  He is accompanied by his wife today.  Past Medical History:  Diagnosis Date  . Allergy   . Arthritis    hands  . Asthma   . Elevated PSA    s/p neg biopsy  . Enlarged prostate   . Hyperlipidemia   . Hypertension   . Stroke ([place])   . Wears hearing aid     Past Surgical History:  Procedure Laterality Date  . CATARACT EXTRACTION W/PHACO Right 05/08/2015   Procedure: CATARACT EXTRACTION PHACO AND INTRAOCULAR LENS PLACEMENT (IOC);  Surgeon: Leandrew Koyanagi, MD;  Location: [facility];  Service: Ophthalmology;  Laterality: Right;  . PROSTATE BIOPSY    . TONSILLECTOMY      Family History  Problem Relation Age of Onset  . Heart disease Father   . Stroke Mother   . Cancer Sister     Social History   Socioeconomic History  . Marital status: Married     Spouse name: Not on file  . Number of children: 1  . Years of education: Not on file  . Highest education level: Not on file  Occupational History  . Occupation: Retired Technical brewer: retired  Tobacco Use  . Smoking status: Never Smoker  . Smokeless tobacco: Never Used  Substance and Sexual Activity  . Alcohol use: Yes    Alcohol/week: 14.0 standard drinks    Types: 14 Shots of liquor per week  . Drug use: No  . Sexual activity: Yes  Other Topics Concern  . Not on file  Social History Narrative   One biological son, one adopted son, two granddaughters, 37 & 98 yo      Has living will and HPOA- wife Elijah [name].   Would desire CPR.  Would not want prolonged life support.   Social Determinants of Health   Financial Resource Strain: Low Risk   . Difficulty of Paying Living Expenses: Not hard at all  Food Insecurity: No Food Insecurity  . Worried About Charity fundraiser in the Last Year: Never true  . Ran Out of Food in the Last Year: Never true  Transportation Needs: No Transportation Needs  . Lack of  Transportation (Medical): No  . Lack of Transportation (Non-Medical): No  Physical Activity: Sufficiently Active  . Days of Exercise per Week: 6 days  . Minutes of Exercise per Session: 40 min  Stress: No Stress Concern Present  . Feeling of Stress : Not at all  Social Connections: Socially Integrated  . Frequency of Communication with Friends and Family: More than three times a week  . Frequency of Social Gatherings with Friends and Family: More than three times a week  . Attends Religious Services: More than 4 times per year  . Active Member of Clubs or Organizations: Yes  . Attends Archivist Meetings: More than 4 times per year  . Marital Status: Married  Human resources officer Violence: Not At Risk  . Fear of Current or Ex-Partner: No  . Emotionally Abused: No  . Physically Abused: No  . Sexually Abused: No    Outpatient Medications Prior to  Visit  Medication Sig Dispense Refill  . albuterol (VENTOLIN HFA) 108 (90 Base) MCG/ACT inhaler Inhale 1-2 puffs into the lungs every 4 (four) hours as needed for wheezing or shortness of breath.    Marland Kitchen amLODipine (NORVASC) 10 MG tablet Take 1 tablet (10 mg total) by mouth daily. 90 tablet 3  . aspirin EC 81 MG tablet Take 1 tablet (81 mg total) by mouth daily.    Marland Kitchen atorvastatin (LIPITOR) 40 MG tablet Take 1 tablet (40 mg total) by mouth daily at 6 PM. 90 tablet 3  . cholecalciferol (VITAMIN D3) 25 MCG (1000 UNIT) tablet Take 2,000 Units by mouth every other day.     . fluticasone (FLONASE) 50 MCG/ACT nasal spray USE 2 SPRAYS IN EACH NOSTRIL DURING ALLERGY SEASON. 16 g 6  . quinapril (ACCUPRIL) 10 MG tablet Take 1 tablet (10 mg total) by mouth daily. 90 tablet 1  . pseudoephedrine (SUDAFED) 30 MG tablet Take 30 mg by mouth as needed.   (Patient not taking: Reported on 03/08/2020)    . doxycycline (VIBRA-TABS) 100 MG tablet Take 1 tablet (100 mg total) by mouth 2 (two) times daily. (Patient not taking: Reported on 03/25/2020) 20 tablet 0   No facility-administered medications prior to visit.    Allergies  Allergen Reactions  . Penicillins Hives    ROS Review of Systems  Constitutional: Positive for fatigue. Negative for diaphoresis, fever and unexpected weight change.  HENT: Positive for hearing loss.   Eyes: Negative for photophobia and visual disturbance.  Respiratory: Negative.   Cardiovascular: Negative.   Gastrointestinal: Negative.   Genitourinary: Negative.   Musculoskeletal: Negative for gait problem and myalgias.  Skin: Negative for pallor and rash.  Allergic/Immunologic: Negative for immunocompromised state.  Neurological: Negative for light-headedness and numbness.  Hematological: Does not bruise/bleed easily.  Psychiatric/Behavioral: Negative for dysphoric mood.      Objective:    Physical Exam Vitals and nursing note reviewed.  Constitutional:      General: He is  not in acute distress.    Appearance: Normal appearance. He is not ill-appearing or toxic-appearing.  HENT:     Head: Normocephalic and atraumatic.     Right Ear: External ear normal.     Left Ear: External ear normal.  Eyes:     General: No scleral icterus.       Right eye: No discharge.        Left eye: No discharge.     Conjunctiva/sclera: Conjunctivae normal.  Pulmonary:     Effort: Pulmonary effort is normal.  Skin:  General: Skin is warm and dry.  Neurological:     Mental Status: He is alert and oriented to person, place, and time.  Psychiatric:        Mood and Affect: Mood normal.        Behavior: Behavior normal.     BP (!) 142/78   Pulse 73   Temp 97.7 F (36.5 C) (Tympanic)   Ht 5\' 7"  (1.702 m)   Wt 159 lb 3.2 oz (72.2 kg)   SpO2 97%   BMI 24.93 kg/m  Wt Readings from Last 3 Encounters:  03/25/20 159 lb 3.2 oz (72.2 kg)  03/08/20 158 lb (71.7 kg)  02/08/20 157 lb 9.6 oz (71.5 kg)     Health Maintenance Due  Topic Date Due  . TETANUS/TDAP  11/01/2018    There are no preventive care reminders to display for this patient.  No results found for: TSH Lab Results  Component Value Date   WBC 8.4 09/21/2019   HGB 15.6 09/21/2019   HCT 45.1 09/21/2019   MCV 93.9 09/21/2019   PLT 273.0 09/21/2019   Lab Results  Component Value Date   NA 142 09/21/2019   K 4.0 09/21/2019   CO2 28 09/21/2019   GLUCOSE 105 (H) 09/21/2019   BUN 11 09/21/2019   CREATININE 1.03 09/21/2019   BILITOT 1.1 09/21/2019   ALKPHOS 73 09/21/2019   AST 16 09/21/2019   ALT 17 09/21/2019   PROT 6.6 09/21/2019   ALBUMIN 4.1 09/21/2019   CALCIUM 9.6 09/21/2019   ANIONGAP 13 09/05/2019   GFR 69.00 09/21/2019   Lab Results  Component Value Date   CHOL 216 (H) 09/03/2019   Lab Results  Component Value Date   HDL 74 09/03/2019   Lab Results  Component Value Date   LDLCALC 124 (H) 09/03/2019   Lab Results  Component Value Date   TRIG 89 09/03/2019   Lab Results    Component Value Date   CHOLHDL 2.9 09/03/2019   Lab Results  Component Value Date   HGBA1C 4.9 09/03/2019      Assessment & Plan:   Problem List Items Addressed This Visit      Other   Vitamin D deficiency - Primary   Relevant Orders   VITAMIN D 25 Hydroxy (Vit-D Deficiency, Fractures)   Need for influenza vaccination   Relevant Orders   Flu Vaccine QUAD High Dose(Fluad) (Completed)   Inclusion cyst   Fatigue   Relevant Orders   CBC   Comprehensive metabolic panel   TSH   Elevated glucose   Relevant Orders   Comprehensive metabolic panel   Hemoglobin A1c      No orders of the defined types were placed in this encounter.   Follow-up: Return in about 3 months (around 06/24/2020).   Blood work is pending regarding fatigue.  Follow-up in 3 months. Libby Maw, MD

## 2020-04-24 DIAGNOSIS — Z23 Encounter for immunization: Secondary | ICD-10-CM | POA: Diagnosis not present

## 2020-04-30 ENCOUNTER — Telehealth: Payer: Self-pay | Admitting: Family Medicine

## 2020-04-30 NOTE — Telephone Encounter (Signed)
Pt wants to get shingles shot, Is that ok? And pt just had booster shot for covid vaccine on 04/22/20 and wanted to know how long he should wait in between if its ok for him to get

## 2020-04-30 NOTE — Telephone Encounter (Signed)
Okay to have shingrix.

## 2020-05-02 NOTE — Telephone Encounter (Signed)
Pt has medicare and would need to get it done at a pharmacy for it to be covered, please advise

## 2020-05-02 NOTE — Telephone Encounter (Signed)
Spoke with patient who verbally that you either schedule an appointment online to have shingles vaccine or walk in to [facility].

## 2020-06-25 ENCOUNTER — Ambulatory Visit: Payer: Medicare Other | Admitting: Family Medicine

## 2020-06-27 DIAGNOSIS — H2512 Age-related nuclear cataract, left eye: Secondary | ICD-10-CM | POA: Diagnosis not present

## 2020-07-18 ENCOUNTER — Other Ambulatory Visit: Payer: Self-pay | Admitting: Family Medicine

## 2020-07-25 ENCOUNTER — Other Ambulatory Visit: Payer: Self-pay

## 2020-07-26 ENCOUNTER — Ambulatory Visit: Payer: Medicare Other | Admitting: Family Medicine

## 2020-07-26 ENCOUNTER — Ambulatory Visit (INDEPENDENT_AMBULATORY_CARE_PROVIDER_SITE_OTHER): Payer: Medicare Other | Admitting: Family Medicine

## 2020-07-26 ENCOUNTER — Encounter: Payer: Self-pay | Admitting: Family Medicine

## 2020-07-26 VITALS — BP 132/70 | HR 73 | Temp 97.4°F | Ht 67.0 in | Wt 162.4 lb

## 2020-07-26 DIAGNOSIS — E7800 Pure hypercholesterolemia, unspecified: Secondary | ICD-10-CM

## 2020-07-26 DIAGNOSIS — I10 Essential (primary) hypertension: Secondary | ICD-10-CM

## 2020-07-26 MED ORDER — ATORVASTATIN CALCIUM 40 MG PO TABS: 40.0000 mg | ORAL_TABLET | Freq: Every day | ORAL | 3 refills | Status: DC

## 2020-07-26 MED ORDER — QUINAPRIL HCL 10 MG PO TABS: 10.0000 mg | ORAL_TABLET | Freq: Every day | ORAL | 3 refills | Status: DC

## 2020-07-26 MED ORDER — AMLODIPINE BESYLATE 10 MG PO TABS: 10.0000 mg | ORAL_TABLET | Freq: Every day | ORAL | 3 refills | Status: DC

## 2020-07-26 NOTE — Progress Notes (Signed)
Elijah [name] is a 86 y.o. male  Chief Complaint  Patient presents with  . Follow-up    F/u HTN/chol and med refills.  Average BP at home 130/70 & 140/80    HPI: Elijah [name] is a 86 y.o. male patient of Dr. Ethelene Hal seen today for routine f/u on HTN, hyerpcholesterolemia. Pt states he is here because he was told to f/u in 32mo. No acute issues or concerns. He does need med refills of both BP meds and cholesterol both. Pt walks almost daily with his wife 1.5 miles. Diet is healthy overall. He denies CP, SOB, dizziness, HA.  He reports home BP 130's/70-80. This AM at home 132/70.  Pt is not fasting today.  Lab Results  Component Value Date   NA 143 03/25/2020   K 4.1 03/25/2020   CREATININE 1.07 03/25/2020   GFRNONAA 58 (L) 09/05/2019   GFRAA >60 09/05/2019   GLUCOSE 97 03/25/2020   Lab Results  Component Value Date   CHOL 216 (H) 09/03/2019   HDL 74 09/03/2019   LDLCALC 124 (H) 09/03/2019   LDLDIRECT 50.0 09/21/2019   TRIG 89 09/03/2019   CHOLHDL 2.9 09/03/2019   Lab Results  Component Value Date   WBC 10.2 03/25/2020   HGB 15.9 03/25/2020   HCT 46.5 03/25/2020   MCV 89.2 03/25/2020   PLT 211.0 03/25/2020    Past Medical History:  Diagnosis Date  . Allergy   . Arthritis    hands  . Asthma   . Elevated PSA    s/p neg biopsy  . Enlarged prostate   . Hyperlipidemia   . Hypertension   . Stroke ([place])   . Wears hearing aid     Past Surgical History:  Procedure Laterality Date  . CATARACT EXTRACTION W/PHACO Right 05/08/2015   Procedure: CATARACT EXTRACTION PHACO AND INTRAOCULAR LENS PLACEMENT ([facility]);  Surgeon: Leandrew Koyanagi, MD;  Location: [facility];  Service: Ophthalmology;  Laterality: Right;  . PROSTATE BIOPSY    . TONSILLECTOMY      Social History   Socioeconomic History  . Marital status: Married    Spouse name: Not on file  . Number of children: 1  . Years of education: Not on file  . Highest education level: Not on file   Occupational History  . Occupation: Retired Technical brewer: retired  Tobacco Use  . Smoking status: Never Smoker  . Smokeless tobacco: Never Used  Substance and Sexual Activity  . Alcohol use: Yes    Alcohol/week: 14.0 standard drinks    Types: 14 Shots of liquor per week  . Drug use: No  . Sexual activity: Yes  Other Topics Concern  . Not on file  Social History Narrative   One biological son, one adopted son, two granddaughters, 34 & 20 yo      Has living will and HPOA- wife Leward Demers.   Would desire CPR.  Would not want prolonged life support.   Social Determinants of Health   Financial Resource Strain: Low Risk   . Difficulty of Paying Living Expenses: Not hard at all  Food Insecurity: No Food Insecurity  . Worried About Charity fundraiser in the Last Year: Never true  . Ran Out of Food in the Last Year: Never true  Transportation Needs: No Transportation Needs  . Lack of Transportation (Medical): No  . Lack of Transportation (Non-Medical): No  Physical Activity: Sufficiently Active  . Days of Exercise per Week: 6  days  . Minutes of Exercise per Session: 40 min  Stress: No Stress Concern Present  . Feeling of Stress : Not at all  Social Connections: Socially Integrated  . Frequency of Communication with Friends and Family: More than three times a week  . Frequency of Social Gatherings with Friends and Family: More than three times a week  . Attends Religious Services: More than 4 times per year  . Active Member of Clubs or Organizations: Yes  . Attends Archivist Meetings: More than 4 times per year  . Marital Status: Married  Human resources officer Violence: Not At Risk  . Fear of Current or Ex-Partner: No  . Emotionally Abused: No  . Physically Abused: No  . Sexually Abused: No    Family History  Problem Relation Age of Onset  . Heart disease Father   . Stroke Mother   . Cancer Sister      Immunization History  Administered Date(s)  Administered  . Fluad Quad(high Dose 65+) 02/27/2019, 03/25/2020  . Influenza, High Dose Seasonal PF 04/12/2014, 04/05/2017, 03/30/2018  . Influenza-Unspecified 04/06/2016  . PFIZER(Purple Top)SARS-COV-2 Vaccination 08/13/2019, 09/05/2019  . Pneumococcal Conjugate-13 02/19/2014  . Pneumococcal Polysaccharide-23 09/07/2007  . Td 10/31/2008  . Zoster 09/07/2007    Outpatient Encounter Medications as of 07/26/2020  Medication Sig Note  . albuterol (VENTOLIN HFA) 108 (90 Base) MCG/ACT inhaler Inhale 1-2 puffs into the lungs every 4 (four) hours as needed for wheezing or shortness of breath. 03/08/2020: PRN  . amLODipine (NORVASC) 10 MG tablet Take 1 tablet (10 mg total) by mouth daily.   Marland Kitchen aspirin EC 81 MG tablet Take 1 tablet (81 mg total) by mouth daily.   Marland Kitchen atorvastatin (LIPITOR) 40 MG tablet Take 1 tablet (40 mg total) by mouth daily at 6 PM.   . cholecalciferol (VITAMIN D3) 25 MCG (1000 UNIT) tablet Take 2,000 Units by mouth every other day.    . fluticasone (FLONASE) 50 MCG/ACT nasal spray USE 2 SPRAYS IN EACH NOSTRIL DURING ALLERGY SEASON.   Marland Kitchen pseudoephedrine (SUDAFED) 30 MG tablet Take 30 mg by mouth as needed. 05/08/2015: PRN last used a few months ago    . quinapril (ACCUPRIL) 10 MG tablet Take 1 tablet (10 mg total) by mouth daily.    No facility-administered encounter medications on file as of 07/26/2020.     ROS: Pertinent positives and negatives noted in HPI. Remainder of ROS non-contributory   Allergies  Allergen Reactions  . Penicillins Hives    BP 132/70   Pulse 73   Temp (!) 97.4 F (36.3 C) (Oral)   Ht 5\' 7"  (1.702 m)   Wt 162 lb 6.4 oz (73.7 kg)   SpO2 99%   BMI 25.44 kg/m   Physical Exam Constitutional:      General: He is not in acute distress.    Appearance: Normal appearance. He is not ill-appearing.  Cardiovascular:     Rate and Rhythm: Normal rate and regular rhythm.     Pulses: Normal pulses.  Pulmonary:     Effort: Pulmonary effort is normal.  No respiratory distress.     Breath sounds: Normal breath sounds. No wheezing or rhonchi.  Musculoskeletal:     Right lower leg: Edema present.     Left lower leg: Edema (trace B/L pedal edema) present.  Neurological:     Mental Status: He is alert and oriented to person, place, and time.  Psychiatric:        Mood and Affect:  Mood normal.        Behavior: Behavior normal.      A/P:  1. Essential hypertension - controlled, at goal Refill: - quinapril (ACCUPRIL) 10 MG tablet; Take 1 tablet (10 mg total) by mouth daily.  Dispense: 90 tablet; Refill: 3 - amLODipine (NORVASC) 10 MG tablet; Take 1 tablet (10 mg total) by mouth daily.  Dispense: 90 tablet; Refill: 3 - cont with low sodium diet, regular walking - f/u in 6 mo or sooner PRN  2. Pure hypercholesterolemia - last FLP in 08/2019. Pt is not fasting today and would like to wait until next appt with PCP to do labs. I am agreeable to this.  Refill: - atorvastatin (LIPITOR) 40 MG tablet; Take 1 tablet (40 mg total) by mouth daily at 6 PM.  Dispense: 90 tablet; Refill: 3 - f/u in 6 mo or sooner PRN  This visit occurred during the SARS-CoV-2 public health emergency.  Safety protocols were in place, including screening questions prior to the visit, additional usage of staff PPE, and extensive cleaning of exam room while observing appropriate contact time as indicated for disinfecting solutions.

## 2020-08-01 DIAGNOSIS — Z85828 Personal history of other malignant neoplasm of skin: Secondary | ICD-10-CM | POA: Diagnosis not present

## 2020-08-01 DIAGNOSIS — D225 Melanocytic nevi of trunk: Secondary | ICD-10-CM | POA: Diagnosis not present

## 2020-08-01 DIAGNOSIS — D2261 Melanocytic nevi of right upper limb, including shoulder: Secondary | ICD-10-CM | POA: Diagnosis not present

## 2020-08-01 DIAGNOSIS — L821 Other seborrheic keratosis: Secondary | ICD-10-CM | POA: Diagnosis not present

## 2020-08-01 DIAGNOSIS — D2271 Melanocytic nevi of right lower limb, including hip: Secondary | ICD-10-CM | POA: Diagnosis not present

## 2020-08-01 DIAGNOSIS — Z08 Encounter for follow-up examination after completed treatment for malignant neoplasm: Secondary | ICD-10-CM | POA: Diagnosis not present

## 2020-08-01 DIAGNOSIS — Z85820 Personal history of malignant melanoma of skin: Secondary | ICD-10-CM | POA: Diagnosis not present

## 2021-01-23 ENCOUNTER — Other Ambulatory Visit: Payer: Self-pay

## 2021-01-24 ENCOUNTER — Encounter: Payer: Self-pay | Admitting: Family Medicine

## 2021-01-24 ENCOUNTER — Ambulatory Visit (INDEPENDENT_AMBULATORY_CARE_PROVIDER_SITE_OTHER): Payer: Medicare Other | Admitting: Family Medicine

## 2021-01-24 VITALS — BP 128/76 | HR 76 | Temp 97.6°F | Ht 67.0 in | Wt 160.2 lb

## 2021-01-24 DIAGNOSIS — E559 Vitamin D deficiency, unspecified: Secondary | ICD-10-CM

## 2021-01-24 DIAGNOSIS — R972 Elevated prostate specific antigen [PSA]: Secondary | ICD-10-CM | POA: Diagnosis not present

## 2021-01-24 DIAGNOSIS — Z0000 Encounter for general adult medical examination without abnormal findings: Secondary | ICD-10-CM | POA: Diagnosis not present

## 2021-01-24 DIAGNOSIS — I10 Essential (primary) hypertension: Secondary | ICD-10-CM | POA: Diagnosis not present

## 2021-01-24 DIAGNOSIS — Z8673 Personal history of transient ischemic attack (TIA), and cerebral infarction without residual deficits: Secondary | ICD-10-CM

## 2021-01-24 DIAGNOSIS — R5382 Chronic fatigue, unspecified: Secondary | ICD-10-CM

## 2021-01-24 DIAGNOSIS — E7800 Pure hypercholesterolemia, unspecified: Secondary | ICD-10-CM

## 2021-01-24 DIAGNOSIS — N400 Enlarged prostate without lower urinary tract symptoms: Secondary | ICD-10-CM

## 2021-01-24 DIAGNOSIS — M545 Low back pain, unspecified: Secondary | ICD-10-CM

## 2021-01-24 NOTE — Progress Notes (Signed)
Established Patient Office Visit  Subjective:  Patient ID: Elijah [name], male    DOB: January 18, 1935  Age: 86 y.o. MRN: [id number]  CC:  Chief Complaint  Patient presents with   Follow-up    Follow up on BP, patient would like to know if he should have 2nd covid booster in October. When should he have fasting labs.     HPI Shykeim Sole presents for follow-up of hypertension, elevated cholesterol, vitamin D deficiency and some fatigue.  Patient reports some fatigue from time to time but it does not stop him from exercising regularly by walking a mile and a half with his wife.  Blood pressure is well controlled with amlodipine and Accupril.  He is having no issues taking his medications.  Tolerating the atorvastatin well for cholesterol control.  He is taking an 81 mg aspirin without issue secondary to his history of CVA.  He deals with lower back pain from time to time that is nonradiating and nonprogressive.  He takes an occasional ibuprofen.  Urine flow is good but he does experience nocturia.  He has been followed by urology for mildly elevated PSA.  Status post negative biopsies x2.  Past Medical History:  Diagnosis Date   Allergy    Arthritis    hands   Asthma    Elevated PSA    s/p neg biopsy   Enlarged prostate    Hyperlipidemia    Hypertension    Stroke [facility])    Wears hearing aid     Past Surgical History:  Procedure Laterality Date   CATARACT EXTRACTION W/PHACO Right 05/08/2015   Procedure: CATARACT EXTRACTION PHACO AND INTRAOCULAR LENS PLACEMENT ([place]);  Surgeon: Leandrew Koyanagi, MD;  Location: [place];  Service: Ophthalmology;  Laterality: Right;   PROSTATE BIOPSY     TONSILLECTOMY      Family History  Problem Relation Age of Onset   Heart disease Father    Stroke Mother    Cancer Sister     Social History   Socioeconomic History   Marital status: Married    Spouse name: Not on file   Number of children: 1   Years of education: Not on file    Highest education level: Not on file  Occupational History   Occupation: Retired Technical brewer: retired  Tobacco Use   Smoking status: Never   Smokeless tobacco: Never  Substance and Sexual Activity   Alcohol use: Yes    Alcohol/week: 14.0 standard drinks    Types: 14 Shots of liquor per week   Drug use: No   Sexual activity: Yes  Other Topics Concern   Not on file  Social History Narrative   One biological son, one adopted son, two granddaughters, 59 & 65 yo      Has living will and HPOA- wife Rishith Icaza.   Would desire CPR.  Would not want prolonged life support.   Social Determinants of Health   Financial Resource Strain: Not on file  Food Insecurity: Not on file  Transportation Needs: Not on file  Physical Activity: Not on file  Stress: Not on file  Social Connections: Not on file  Intimate Partner Violence: Not on file    Outpatient Medications Prior to Visit  Medication Sig Dispense Refill   albuterol (VENTOLIN HFA) 108 (90 Base) MCG/ACT inhaler Inhale 1-2 puffs into the lungs every 4 (four) hours as needed for wheezing or shortness of breath.     amLODipine (NORVASC) 10  MG tablet Take 1 tablet (10 mg total) by mouth daily. 90 tablet 3   aspirin EC 81 MG tablet Take 1 tablet (81 mg total) by mouth daily.     atorvastatin (LIPITOR) 40 MG tablet Take 1 tablet (40 mg total) by mouth daily at 6 PM. 90 tablet 3   cholecalciferol (VITAMIN D3) 25 MCG (1000 UNIT) tablet Take 2,000 Units by mouth every other day.      fluticasone (FLONASE) 50 MCG/ACT nasal spray USE 2 SPRAYS IN EACH NOSTRIL DURING ALLERGY SEASON. 16 mL 6   pseudoephedrine (SUDAFED) 30 MG tablet Take 30 mg by mouth as needed.     quinapril (ACCUPRIL) 10 MG tablet Take 1 tablet (10 mg total) by mouth daily. 90 tablet 3   No facility-administered medications prior to visit.    Allergies  Allergen Reactions   Penicillins Hives    ROS Review of Systems  Constitutional:  Negative for  diaphoresis, fatigue, fever and unexpected weight change.  HENT: Negative.    Eyes:  Negative for photophobia and visual disturbance.  Respiratory: Negative.    Cardiovascular: Negative.   Gastrointestinal: Negative.   Genitourinary:  Negative for difficulty urinating, frequency and urgency.  Musculoskeletal:  Positive for back pain.  Neurological:  Negative for speech difficulty, weakness and numbness.  Psychiatric/Behavioral: Negative.       Objective:    Physical Exam Vitals and nursing note reviewed.  Constitutional:      General: He is not in acute distress.    Appearance: Normal appearance. He is normal weight. He is not ill-appearing, toxic-appearing or diaphoretic.  HENT:     Head: Normocephalic and atraumatic.     Right Ear: Tympanic membrane, ear canal and external ear normal.     Left Ear: Tympanic membrane, ear canal and external ear normal.     Mouth/Throat:     Mouth: Mucous membranes are moist.     Pharynx: Oropharynx is clear. No oropharyngeal exudate or posterior oropharyngeal erythema.  Eyes:     General:        Right eye: No discharge.        Left eye: No discharge.     Extraocular Movements: Extraocular movements intact.     Conjunctiva/sclera: Conjunctivae normal.     Pupils: Pupils are equal, round, and reactive to light.  Neck:     Vascular: No carotid bruit.  Cardiovascular:     Rate and Rhythm: Normal rate and regular rhythm.  Pulmonary:     Effort: Pulmonary effort is normal.     Breath sounds: Normal breath sounds.  Abdominal:     General: Bowel sounds are normal.  Musculoskeletal:     Cervical back: No rigidity or tenderness.     Lumbar back: No deformity, spasms, tenderness or bony tenderness.  Lymphadenopathy:     Cervical: No cervical adenopathy.  Skin:    General: Skin is warm and dry.  Neurological:     Mental Status: He is alert and oriented to person, place, and time.  Psychiatric:        Mood and Affect: Mood normal.         Behavior: Behavior normal.    BP 128/76 (BP Location: Right Arm, Patient Position: Sitting, Cuff Size: Normal)   Pulse 76   Temp 97.6 F (36.4 C) (Temporal)   Ht '5\' 7"'$  (1.702 m)   Wt 160 lb 3.2 oz (72.7 kg)   SpO2 98%   BMI 25.09 kg/m  Wt Readings from Last  3 Encounters:  01/24/21 160 lb 3.2 oz (72.7 kg)  07/26/20 162 lb 6.4 oz (73.7 kg)  03/25/20 159 lb 3.2 oz (72.2 kg)     Health Maintenance Due  Topic Date Due   TETANUS/TDAP  11/01/2018   COVID-19 Vaccine (4 - Booster for Pfizer series) 08/25/2020    There are no preventive care reminders to display for this patient.  Lab Results  Component Value Date   TSH 1.66 03/25/2020   Lab Results  Component Value Date   WBC 10.2 03/25/2020   HGB 15.9 03/25/2020   HCT 46.5 03/25/2020   MCV 89.2 03/25/2020   PLT 211.0 03/25/2020   Lab Results  Component Value Date   NA 143 03/25/2020   K 4.1 03/25/2020   CO2 27 03/25/2020   GLUCOSE 97 03/25/2020   BUN 14 03/25/2020   CREATININE 1.07 03/25/2020   BILITOT 1.1 03/25/2020   ALKPHOS 72 03/25/2020   AST 16 03/25/2020   ALT 13 03/25/2020   PROT 7.0 03/25/2020   ALBUMIN 4.3 03/25/2020   CALCIUM 9.1 03/25/2020   ANIONGAP 13 09/05/2019   GFR 65.95 03/25/2020   Lab Results  Component Value Date   CHOL 216 (H) 09/03/2019   Lab Results  Component Value Date   HDL 74 09/03/2019   Lab Results  Component Value Date   LDLCALC 124 (H) 09/03/2019   Lab Results  Component Value Date   TRIG 89 09/03/2019   Lab Results  Component Value Date   CHOLHDL 2.9 09/03/2019   Lab Results  Component Value Date   HGBA1C 5.5 03/25/2020      Assessment & Plan:   Problem List Items Addressed This Visit       Cardiovascular and Mediastinum   Essential hypertension   Relevant Orders   CBC   Comprehensive metabolic panel   Urinalysis, Routine w reflex microscopic     Other   Vitamin D deficiency   History of cerebrovascular accident (CVA) involving cerebellum    Relevant Orders   TSH   Pure hypercholesterolemia - Primary   Relevant Orders   CBC   Comprehensive metabolic panel   Lipid panel   Other Visit Diagnoses     Healthcare maintenance       Chronic fatigue, unspecified        Relevant Orders   TSH       No orders of the defined types were placed in this encounter.   Follow-up: Return in about 6 months (around 07/27/2021), or return fasting for blood work..  Will return fasting for above ordered blood work.  We discussed options for treatment and work-up of lower back pain.  He would like to hold off for now.  Libby Maw, MD

## 2021-01-28 ENCOUNTER — Other Ambulatory Visit: Payer: Self-pay

## 2021-01-28 ENCOUNTER — Other Ambulatory Visit (INDEPENDENT_AMBULATORY_CARE_PROVIDER_SITE_OTHER): Payer: Medicare Other

## 2021-01-28 DIAGNOSIS — I10 Essential (primary) hypertension: Secondary | ICD-10-CM | POA: Diagnosis not present

## 2021-01-28 DIAGNOSIS — Z8673 Personal history of transient ischemic attack (TIA), and cerebral infarction without residual deficits: Secondary | ICD-10-CM

## 2021-01-28 DIAGNOSIS — E7800 Pure hypercholesterolemia, unspecified: Secondary | ICD-10-CM | POA: Diagnosis not present

## 2021-01-28 DIAGNOSIS — R5382 Chronic fatigue, unspecified: Secondary | ICD-10-CM | POA: Diagnosis not present

## 2021-01-28 LAB — LIPID PANEL
Cholesterol: 160 mg/dL (ref 0–200)
HDL: 69.9 mg/dL (ref >39.00)
LDL Cholesterol: 77 mg/dL (ref 0–99)
NonHDL: 90.23
Total CHOL/HDL Ratio: 2
Triglycerides: 64 mg/dL (ref 0.0–149.0)
VLDL: 12.8 mg/dL (ref 0.0–40.0)

## 2021-01-28 LAB — COMPREHENSIVE METABOLIC PANEL
ALT: 12 U/L (ref 0–53)
AST: 15 U/L (ref 0–37)
Albumin: 4.1 g/dL (ref 3.5–5.2)
Alkaline Phosphatase: 63 U/L (ref 39–117)
BUN: 12 mg/dL (ref 6–23)
CO2: 25 mEq/L (ref 19–32)
Calcium: 9.1 mg/dL (ref 8.4–10.5)
Chloride: 103 mEq/L (ref 96–112)
Creatinine, Ser: 0.88 mg/dL (ref 0.40–1.50)
GFR: 79.16 mL/min (ref >60.00)
Glucose, Bld: 99 mg/dL (ref 70–99)
Potassium: 3.9 mEq/L (ref 3.5–5.1)
Sodium: 139 mEq/L (ref 135–145)
Total Bilirubin: 1.5 mg/dL — ABNORMAL HIGH (ref 0.2–1.2)
Total Protein: 7 g/dL (ref 6.0–8.3)

## 2021-01-28 LAB — CBC
HCT: 47.2 % (ref 39.0–52.0)
Hemoglobin: 16.2 g/dL (ref 13.0–17.0)
MCHC: 34.3 g/dL (ref 30.0–36.0)
MCV: 90.9 fl (ref 78.0–100.0)
Platelets: 219 10*3/uL (ref 150.0–400.0)
RBC: 5.19 Mil/uL (ref 4.22–5.81)
RDW: 14 % (ref 11.5–15.5)
WBC: 7.6 10*3/uL (ref 4.0–10.5)

## 2021-01-28 LAB — URINALYSIS, ROUTINE W REFLEX MICROSCOPIC
Bilirubin Urine: NEGATIVE
Hgb urine dipstick: NEGATIVE
Ketones, ur: NEGATIVE
Leukocytes,Ua: NEGATIVE
Nitrite: NEGATIVE
RBC / HPF: NONE SEEN (ref 0–?)
Specific Gravity, Urine: 1.015 (ref 1.000–1.030)
Total Protein, Urine: NEGATIVE
Urine Glucose: NEGATIVE
Urobilinogen, UA: 1 (ref 0.0–1.0)
WBC, UA: NONE SEEN (ref 0–?)
pH: 7 (ref 5.0–8.0)

## 2021-01-28 LAB — TSH: TSH: 2.45 u[IU]/mL (ref 0.35–5.50)

## 2021-01-28 NOTE — Progress Notes (Signed)
Per the orders of Dr. Ethelene Hal pt is here for labs pt tolerated draw well.

## 2021-01-29 ENCOUNTER — Ambulatory Visit: Payer: Medicare Other

## 2021-02-04 ENCOUNTER — Ambulatory Visit (INDEPENDENT_AMBULATORY_CARE_PROVIDER_SITE_OTHER): Payer: Medicare Other | Admitting: *Deleted

## 2021-02-04 DIAGNOSIS — Z0000 Encounter for general adult medical examination without abnormal findings: Secondary | ICD-10-CM | POA: Diagnosis not present

## 2021-02-04 NOTE — Patient Instructions (Signed)
Elijah [name] , Thank you for taking time to come for your Medicare Wellness Visit. I appreciate your ongoing commitment to your health goals. Please review the following plan we discussed and let me know if I can assist you in the future.   Screening recommendations/referrals: Colonoscopy: no longer required Recommended yearly ophthalmology/optometry visit for glaucoma screening and checkup Recommended yearly dental visit for hygiene and checkup  Vaccinations: Influenza vaccine: up to date Pneumococcal vaccine: Education provided Tdap vaccine: Education provided Shingles vaccine: up to date    Advanced directives: education provided  Conditions/risks identified: na  Next appointment: 07-29-2021 '@8'$ :21 Dr. Ethelene Hal  Preventive Care 84 Years and Older, Male Preventive care refers to lifestyle choices and visits with your health care provider that can promote health and wellness. What does preventive care include? A yearly physical exam. This is also called an annual well check. Dental exams once or twice a year. Routine eye exams. Ask your health care provider how often you should have your eyes checked. Personal lifestyle choices, including: Daily care of your teeth and gums. Regular physical activity. Eating a healthy diet. Avoiding tobacco and drug use. Limiting alcohol use. Practicing safe sex. Taking low doses of aspirin every day. Taking vitamin and mineral supplements as recommended by your health care provider. What happens during an annual well check? The services and screenings done by your health care provider during your annual well check will depend on your age, overall health, lifestyle risk factors, and family history of disease. Counseling  Your health care provider may ask you questions about your: Alcohol use. Tobacco use. Drug use. Emotional well-being. Home and relationship well-being. Sexual activity. Eating habits. History of falls. Memory and ability to  understand (cognition). Work and work Statistician. Screening  You may have the following tests or measurements: Height, weight, and BMI. Blood pressure. Lipid and cholesterol levels. These may be checked every 5 years, or more frequently if you are over 74 years old. Skin check. Lung cancer screening. You may have this screening every year starting at age 80 if you have a 30-pack-year history of smoking and currently smoke or have quit within the past 15 years. Fecal occult blood test (FOBT) of the stool. You may have this test every year starting at age 38. Flexible sigmoidoscopy or colonoscopy. You may have a sigmoidoscopy every 5 years or a colonoscopy every 10 years starting at age 22. Prostate cancer screening. Recommendations will vary depending on your family history and other risks. Hepatitis C blood test. Hepatitis B blood test. Sexually transmitted disease (STD) testing. Diabetes screening. This is done by checking your blood sugar (glucose) after you have not eaten for a while (fasting). You may have this done every 1-3 years. Abdominal aortic aneurysm (AAA) screening. You may need this if you are a current or former smoker. Osteoporosis. You may be screened starting at age 12 if you are at high risk. Talk with your health care provider about your test results, treatment options, and if necessary, the need for more tests. Vaccines  Your health care provider may recommend certain vaccines, such as: Influenza vaccine. This is recommended every year. Tetanus, diphtheria, and acellular pertussis (Tdap, Td) vaccine. You may need a Td booster every 10 years. Zoster vaccine. You may need this after age 48. Pneumococcal 13-valent conjugate (PCV13) vaccine. One dose is recommended after age 59. Pneumococcal polysaccharide (PPSV23) vaccine. One dose is recommended after age 43. Talk to your health care provider about which screenings and vaccines you  need and how often you need them. This  information is not intended to replace advice given to you by your health care provider. Make sure you discuss any questions you have with your health care provider. Document Released: 07/12/2015 Document Revised: 03/04/2016 Document Reviewed: 04/16/2015 Elsevier Interactive Patient Education  2017 [place] Prevention in the Home Falls can cause injuries. They can happen to people of all ages. There are many things you can do to make your home safe and to help prevent falls. What can I do on the outside of my home? Regularly fix the edges of walkways and driveways and fix any cracks. Remove anything that might make you trip as you walk through a door, such as a raised step or threshold. Trim any bushes or trees on the path to your home. Use bright outdoor lighting. Clear any walking paths of anything that might make someone trip, such as rocks or tools. Regularly check to see if handrails are loose or broken. Make sure that both sides of any steps have handrails. Any raised decks and porches should have guardrails on the edges. Have any leaves, snow, or ice cleared regularly. Use sand or salt on walking paths during winter. Clean up any spills in your garage right away. This includes oil or grease spills. What can I do in the bathroom? Use night lights. Install grab bars by the toilet and in the tub and shower. Do not use towel bars as grab bars. Use non-skid mats or decals in the tub or shower. If you need to sit down in the shower, use a plastic, non-slip stool. Keep the floor dry. Clean up any water that spills on the floor as soon as it happens. Remove soap buildup in the tub or shower regularly. Attach bath mats securely with double-sided non-slip rug tape. Do not have throw rugs and other things on the floor that can make you trip. What can I do in the bedroom? Use night lights. Make sure that you have a light by your bed that is easy to reach. Do not use any Nicks or  blankets that are too big for your bed. They should not hang down onto the floor. Have a firm chair that has side arms. You can use this for support while you get dressed. Do not have throw rugs and other things on the floor that can make you trip. What can I do in the kitchen? Clean up any spills right away. Avoid walking on wet floors. Keep items that you use a lot in easy-to-reach places. If you need to reach something above you, use a strong step stool that has a grab bar. Keep electrical cords out of the way. Do not use floor polish or wax that makes floors slippery. If you must use wax, use non-skid floor wax. Do not have throw rugs and other things on the floor that can make you trip. What can I do with my stairs? Do not leave any items on the stairs. Make sure that there are handrails on both sides of the stairs and use them. Fix handrails that are broken or loose. Make sure that handrails are as long as the stairways. Check any carpeting to make sure that it is firmly attached to the stairs. Fix any carpet that is loose or worn. Avoid having throw rugs at the top or bottom of the stairs. If you do have throw rugs, attach them to the floor with carpet tape. Make sure that  you have a light switch at the top of the stairs and the bottom of the stairs. If you do not have them, ask someone to add them for you. What else can I do to help prevent falls? Wear shoes that: Do not have high heels. Have rubber bottoms. Are comfortable and fit you well. Are closed at the toe. Do not wear sandals. If you use a stepladder: Make sure that it is fully opened. Do not climb a closed stepladder. Make sure that both sides of the stepladder are locked into place. Ask someone to hold it for you, if possible. Clearly mark and make sure that you can see: Any grab bars or handrails. First and last steps. Where the edge of each step is. Use tools that help you move around (mobility aids) if they are  needed. These include: Canes. Walkers. Scooters. Crutches. Turn on the lights when you go into a dark area. Replace any light bulbs as soon as they burn out. Set up your furniture so you have a clear path. Avoid moving your furniture around. If any of your floors are uneven, fix them. If there are any pets around you, be aware of where they are. Review your medicines with your doctor. Some medicines can make you feel dizzy. This can increase your chance of falling. Ask your doctor what other things that you can do to help prevent falls. This information is not intended to replace advice given to you by your health care provider. Make sure you discuss any questions you have with your health care provider. Document Released: 04/11/2009 Document Revised: 11/21/2015 Document Reviewed: 07/20/2014 Elsevier Interactive Patient Education  2017 [employer].

## 2021-02-04 NOTE — Progress Notes (Signed)
Subjective:   Elijah [name] is a 86 y.o. male who presents for Medicare Annual/Subsequent preventive examination.  I connected with  Elijah [name] on 02/04/21 by a telephone enabled telemedicine application and verified that I am speaking with the correct person using two identifiers.   I discussed the limitations of evaluation and management by telemedicine. The patient expressed understanding and agreed to proceed.   Review of Systems    NA Cardiac Risk Factors include: advanced age (>47mn, >>73women);hypertension;male gender;dyslipidemia     Objective:    Today's Vitals   There is no height or weight on file to calculate BMI.  Advanced Directives 02/04/2021 01/24/2020 09/03/2019 09/03/2019 01/18/2019 06/11/2016 05/08/2015  Does Patient Have a Medical Advance Directive? No Yes Yes Yes Yes Yes Yes  Type of Advance Directive - [place] will [place] will [place]Living will [place]Living will [place]unction CityLiving will  Does patient want to make changes to medical advance directive? - - No - Patient declined - No - Patient declined - No - Patient declined  Copy of [place] Chart? - No - copy requested No - copy requested - No - copy requested No - copy requested No - copy requested  Would patient like information on creating a medical advance directive? No - Patient declined - - - - - -    Current Medications (verified) Outpatient Encounter Medications as of 02/04/2021  Medication Sig   albuterol (VENTOLIN HFA) 108 (90 Base) MCG/ACT inhaler Inhale 1-2 puffs into the lungs every 4 (four) hours as needed for wheezing or shortness of breath.   amLODipine (NORVASC) 10 MG tablet Take 1 tablet (10 mg total) by mouth daily.   aspirin EC 81 MG tablet Take 1 tablet (81 mg total) by mouth daily.   atorvastatin (LIPITOR) 40 MG  tablet Take 1 tablet (40 mg total) by mouth daily at 6 PM.   cholecalciferol (VITAMIN D3) 25 MCG (1000 UNIT) tablet Take 2,000 Units by mouth every other day.    fluticasone (FLONASE) 50 MCG/ACT nasal spray USE 2 SPRAYS IN EACH NOSTRIL DURING ALLERGY SEASON.   pseudoephedrine (SUDAFED) 30 MG tablet Take 30 mg by mouth as needed.   quinapril (ACCUPRIL) 10 MG tablet Take 1 tablet (10 mg total) by mouth daily.   No facility-administered encounter medications on file as of 02/04/2021.    Allergies (verified) Penicillins   History: Past Medical History:  Diagnosis Date   Allergy    Arthritis    hands   Asthma    Elevated PSA    s/p neg biopsy   Enlarged prostate    Hyperlipidemia    Hypertension    Stroke ([facility]    Wears hearing aid    Past Surgical History:  Procedure Laterality Date   CATARACT EXTRACTION W/PHACO Right 05/08/2015   Procedure: CATARACT EXTRACTION PHACO AND INTRAOCULAR L[place]AC[place]  Surgeon: CLeandrew Koyanagi MD;  [facility]  Service: Ophthalmology;  Laterality: Right;   PROSTATE BIOPSY     TONSILLECTOMY     Family History  Problem Relation Age of Onset   Heart disease Father    Stroke Mother    Cancer Sister    Social History   Socioeconomic History   Marital status: Married    Spouse name: Not on file   Number of children: 1   Years of education: Not on file  Highest education level: Not on file  Occupational History   Occupation: Retired Technical brewer: retired  Tobacco Use   Smoking status: Never   Smokeless tobacco: Never  Substance and Sexual Activity   Alcohol use: Yes    Alcohol/week: 14.0 standard drinks    Types: 14 Shots of liquor per week   Drug use: No   Sexual activity: Yes  Other Topics Concern   Not on file  Social History Narrative   One biological son, one adopted son, two granddaughters, 56 & 95 yo      Has living will and HPOA- wife Tilak Brietzke.   Would desire CPR.  Would not  want prolonged life support.   Social Determinants of Health   Financial Resource Strain: Low Risk    Difficulty of Paying Living Expenses: Not very hard  Food Insecurity: No Food Insecurity   Worried About Charity fundraiser in the Last Year: Never true   Ran Out of Food in the Last Year: Never true  Transportation Needs: Not on file  Physical Activity: Sufficiently Active   Days of Exercise per Week: 5 days   Minutes of Exercise per Session: 40 min  Stress: No Stress Concern Present   Feeling of Stress : Not at all  Social Connections: Moderately Integrated   Frequency of Communication with Friends and Family: More than three times a week   Frequency of Social Gatherings with Friends and Family: More than three times a week   Attends Religious Services: More than 4 times per year   Active Member of [employer] or Organizations: No   Attends Music therapist: Never   Marital Status: Married    Tobacco Counseling Counseling given: Not Answered   Clinical Intake:  Pre-visit preparation completed: Yes  Pain : No/denies pain     Nutritional Risks: None Diabetes: No  How often do you need to have someone help you when you read instructions, pamphlets, or other written materials from your doctor or pharmacy?: 1 - Never  Diabetic   NO  Interpreter Needed?: No  Information entered by :: Leroy Kennedy LPN   Activities of Daily Living In your present state of health, do you have any difficulty performing the following activities: 02/04/2021  Hearing? Y  Vision? N  Difficulty concentrating or making decisions? N  Walking or climbing stairs? N  Dressing or bathing? N  Doing errands, shopping? N  Preparing Food and eating ? N  Using the Toilet? N  In the past six months, have you accidently leaked urine? N  Do you have problems with loss of bowel control? N  Managing your Medications? N  Managing your Finances? N  Housekeeping or managing your Housekeeping? N   Some recent data might be hidden    Patient Care Team: Libby Maw, MD as PCP - General (Family Medicine) Raynelle Bring, MD as Consulting Physician (Urology) Birder Robson, MD as Referring Physician (Ophthalmology) Oneta Rack, MD (Dermatology) Boykin Nearing, DDS as Consulting Physician (Dentistry) Leandrew Koyanagi, MD as Referring Physician (Ophthalmology)  Indicate any recent Medical Services you may have received from other than [facility] providers in the past year (date may be approximate).     Assessment:   This is a routine wellness examination for [place].  Hearing/Vision screen Hearing Screening - Comments:: Hearing both ears Vision Screening - Comments:: [place] Up to date  Dietary issues and exercise activities discussed: Current Exercise Habits: Home exercise routine,  Type of exercise: treadmill;walking, Time (Minutes): 40, Frequency (Times/Week): 5, Weekly Exercise (Minutes/Week): 200, Intensity: Moderate   Goals Addressed             This Visit's Progress    Patient Stated   On track    Continue walking regimen     Patient Stated       Keep on walking 4-5 days a week.          Depression Screen PHQ 2/9 Scores 02/04/2021 01/24/2021 01/24/2020 10/09/2019 01/18/2019 08/24/2017 06/11/2016  PHQ - 2 Score 0 0 0 0 0 0 0    Fall Risk Fall Risk  02/04/2021 01/24/2021 01/24/2020 09/21/2019 05/22/2019  Falls in the past year? 0 0 0 0 0  Comment - - - - Emmi Telephone Survey: data to providers prior to load  Number falls in past yr: 0 - 0 - -  Injury with Fall? 0 - 0 - -  Follow up Falls evaluation completed;Falls prevention discussed - Falls prevention discussed - -    FALL RISK PREVENTION PERTAINING TO THE HOME:  Any stairs in or around the home? Yes  If so, are there any without handrails? No  Home free of loose throw rugs in walkways, pet beds, electrical cords, etc? Yes  Adequate lighting in your home to reduce risk of falls?  Yes   ASSISTIVE DEVICES UTILIZED TO PREVENT FALLS:  Life alert? No  Use of a cane, walker or w/c? No  Grab bars in the bathroom? Yes  Shower chair or bench in shower? Yes  Elevated toilet seat or a handicapped toilet? No   TIMED UP AND GO:  Was the test performed? No .    Cognitive Function:  Normal cognitive status assessed by direct observation by this Nurse Health Advisor. No abnormalities found.   MMSE - Mini Mental State Exam 06/11/2016  Orientation to time 5  Orientation to Place 5  Registration 3  Attention/ Calculation 0  Recall 3  Language- name 2 objects 0  Language- repeat 1  Language- follow 3 step command 3  Language- read & follow direction 0  Write a sentence 0  Copy design 0  Total score 20        Immunizations Immunization History  Administered Date(s) Administered   Fluad Quad(high Dose 65+) 02/27/2019, 03/25/2020   Influenza, High Dose Seasonal PF 04/12/2014, 04/05/2017, 03/30/2018   Influenza-Unspecified 04/06/2016   PFIZER(Purple Top)SARS-COV-2 Vaccination 08/13/2019, 09/05/2019, 04/24/2020   Pneumococcal Conjugate-13 02/19/2014   Pneumococcal Polysaccharide-23 09/07/2007   Td 10/31/2008   Zoster Recombinat (Shingrix) 09/25/2020, 01/09/2021   Zoster, Live 09/07/2007    TDAP status: Due, Education has been provided regarding the importance of this vaccine. Advised may receive this vaccine at local pharmacy or Health Dept. Aware to provide a copy of the vaccination record if obtained from local pharmacy or Health Dept. Verbalized acceptance and understanding.  Flu Vaccine status: Up to date  Pneumococcal vaccine status: Due, Education has been provided regarding the importance of this vaccine. Advised may receive this vaccine at local pharmacy or Health Dept. Aware to provide a copy of the vaccination record if obtained from local pharmacy or Health Dept. Verbalized acceptance and understanding.  Covid-19 vaccine status: Information provided  on how to obtain vaccines.   Qualifies for Shingles Vaccine? No   Zostavax completed Yes   Shingrix Completed?: Yes  Screening Tests Health Maintenance  Topic Date Due   TETANUS/TDAP  11/01/2018   COVID-19 Vaccine (4 - Booster for [employer]  series) 08/25/2020   INFLUENZA VACCINE  01/27/2021   PNA vac Low Risk Adult  Completed   Zoster Vaccines- Shingrix  Completed   HPV VACCINES  Aged Out    Health Maintenance  Health Maintenance Due  Topic Date Due   TETANUS/TDAP  11/01/2018   COVID-19 Vaccine (4 - Booster for Pfizer series) 08/25/2020   INFLUENZA VACCINE  01/27/2021    Colorectal cancer screening: No longer required.   Lung Cancer Screening: (Low Dose CT Chest recommended if Age 76-80 years, 30 pack-year currently smoking OR have quit w/in 15years.) does not qualify.   Lung Cancer Screening Referral:   Additional Screening:  Hepatitis C Screening: does not qualify; C  Vision Screening: Recommended annual ophthalmology exams for early detection of glaucoma and other disorders of the eye. Is the patient up to date with their annual eye exam?  Yes  Who is the provider or what is the name of the office in which the patient attends annual eye exams? [place] If pt is not established with a provider, would they like to be referred to a provider to establish care? No .   Dental Screening: Recommended annual dental exams for proper oral hygiene  Community Resource Referral / Chronic Care Management: CRR required this visit?  No   CCM required this visit?  No      Plan:     I have personally reviewed and noted the following in the patient's chart:   Medical and social history Use of alcohol, tobacco or illicit drugs  Current medications and supplements including opioid prescriptions. Patient is not currently taking opioid prescriptions. Functional ability and status Nutritional status Physical activity Advanced directives List of other  physicians Hospitalizations, surgeries, and ER visits in previous 12 months Vitals Screenings to include cognitive, depression, and falls Referrals and appointments  In addition, I have reviewed and discussed with patient certain preventive protocols, quality metrics, and best practice recommendations. A written personalized care plan for preventive services as well as general preventive health recommendations were provided to patient.     Leroy Kennedy, LPN   [id number]   Nurse Notes: na

## 2021-03-27 ENCOUNTER — Other Ambulatory Visit: Payer: Self-pay

## 2021-03-27 ENCOUNTER — Encounter: Payer: Self-pay | Admitting: Family Medicine

## 2021-03-27 ENCOUNTER — Ambulatory Visit (INDEPENDENT_AMBULATORY_CARE_PROVIDER_SITE_OTHER): Payer: Medicare Other

## 2021-03-27 DIAGNOSIS — Z23 Encounter for immunization: Secondary | ICD-10-CM

## 2021-03-27 NOTE — Progress Notes (Signed)
After obtaining consent, and per orders of Dr. Ethelene Hal, injection of Influenza high dose  given by Lynda Rainwater. Patient instructed to remain in clinic for 20 minutes afterwards, and to report any adverse reaction to me immediately.

## 2021-04-21 ENCOUNTER — Other Ambulatory Visit: Payer: Self-pay

## 2021-04-21 ENCOUNTER — Ambulatory Visit (INDEPENDENT_AMBULATORY_CARE_PROVIDER_SITE_OTHER): Payer: Medicare Other | Admitting: Family Medicine

## 2021-04-21 ENCOUNTER — Encounter: Payer: Self-pay | Admitting: Family Medicine

## 2021-04-21 VITALS — BP 121/68 | HR 70 | Temp 97.0°F | Ht 67.0 in | Wt 160.6 lb

## 2021-04-21 DIAGNOSIS — I10 Essential (primary) hypertension: Secondary | ICD-10-CM | POA: Diagnosis not present

## 2021-04-21 DIAGNOSIS — R42 Dizziness and giddiness: Secondary | ICD-10-CM | POA: Diagnosis not present

## 2021-04-21 NOTE — Progress Notes (Signed)
Established Patient Office Visit  Subjective:  Patient ID: Elijah [name], male    DOB: [birth year]  Age: 86 y.o. MRN: [id number]  CC:  Chief Complaint  Patient presents with   Follow-up    Follow up on one episode of confusion, little dizziness droopy feeling not sure what is going on happened last Sunday one week ago.    HPI Elijah [name] presents for follow-up of an episode that included lightheadedness with unsteady gait 8 days ago.  He had gone to bed early because of fatigue and his wife who is visually impaired woke him up to lower the thermostat setting.  She noted that his gait was wobbly and he was confused but was able to adjust the thermostat.  Does not recall any palpitations. EMS was summoned and his blood sugar was 105, cholesterol was 121/80 and they saw no evidence for stroke.  That day they had eaten lunch but did not eat dinner.  They usually eat 3 meals daily.  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  Blood pressure is well controlled.  He takes an 81 mg aspirin daily.  Cholesterol is well controlled as well.  He does not smoke.  Since that episode his wife is limiting him to 1 drink of alcohol daily.  He denied excessive use before this episode.  Past Medical History:  Diagnosis Date   Allergy    Arthritis    hands   Asthma    Elevated PSA    s/p neg biopsy   Enlarged prostate    Hyperlipidemia    Hypertension    Stroke [facility])    Wears hearing aid     Past Surgical History:  Procedure Laterality Date   CATARACT EXTRACTION W/PHACO Right 05/08/2015   Procedure: CATARACT EXTRACTION PHACO AND INTRAOCULAR LENS PLACEMENT ([place]);  Surgeon: Leandrew Koyanagi, MD;  Location: [place];  Service: Ophthalmology;  Laterality: Right;   PROSTATE BIOPSY     TONSILLECTOMY      Family History  Problem Relation Age of Onset   Heart disease Father    Stroke Mother    Cancer Sister     Social History   Socioeconomic History   Marital  status: Married    Spouse name: Not on file   Number of children: 1   Years of education: Not on file   Highest education level: Not on file  Occupational History   Occupation: Retired Technical brewer: retired  Tobacco Use   Smoking status: Never   Smokeless tobacco: Never  Substance and Sexual Activity   Alcohol use: Yes    Alcohol/week: 14.0 standard drinks    Types: 14 Shots of liquor per week   Drug use: No   Sexual activity: Yes  Other Topics Concern   Not on file  Social History Narrative   One biological son, one adopted son, two granddaughters, 7 & 103 yo      Has living will and HPOA- wife Bane Hagy.   Would desire CPR.  Would not want prolonged life support.   Social Determinants of Health   Financial Resource Strain: Low Risk    Difficulty of Paying Living Expenses: Not very hard  Food Insecurity: No Food Insecurity   Worried About Charity fundraiser in the Last Year: Never true   Ran Out of Food in the Last Year: Never true  Transportation Needs: Not on file  Physical Activity: Sufficiently Active   Days  of Exercise per Week: 5 days   Minutes of Exercise per Session: 40 min  Stress: No Stress Concern Present   Feeling of Stress : Not at all  Social Connections: Moderately Integrated   Frequency of Communication with Friends and Family: More than three times a week   Frequency of Social Gatherings with Friends and Family: More than three times a week   Attends Religious Services: More than 4 times per year   Active Member of [employer] or Organizations: No   Attends Archivist Meetings: Never   Marital Status: Married  Human resources officer Violence: Not At Risk   Fear of Current or Ex-Partner: No   Emotionally Abused: No   Physically Abused: No   Sexually Abused: No    Outpatient Medications Prior to Visit  Medication Sig Dispense Refill   albuterol (VENTOLIN HFA) 108 (90 Base) MCG/ACT inhaler Inhale 1-2 puffs into the lungs every 4  (four) hours as needed for wheezing or shortness of breath.     amLODipine (NORVASC) 10 MG tablet Take 1 tablet (10 mg total) by mouth daily. 90 tablet 3   aspirin EC 81 MG tablet Take 1 tablet (81 mg total) by mouth daily.     atorvastatin (LIPITOR) 40 MG tablet Take 1 tablet (40 mg total) by mouth daily at 6 PM. 90 tablet 3   cholecalciferol (VITAMIN D3) 25 MCG (1000 UNIT) tablet Take 2,000 Units by mouth every other day.      fluticasone (FLONASE) 50 MCG/ACT nasal spray USE 2 SPRAYS IN EACH NOSTRIL DURING ALLERGY SEASON. 16 mL 6   quinapril (ACCUPRIL) 10 MG tablet Take 1 tablet (10 mg total) by mouth daily. 90 tablet 3   pseudoephedrine (SUDAFED) 30 MG tablet Take 30 mg by mouth as needed.     No facility-administered medications prior to visit.    Allergies  Allergen Reactions   Penicillins Hives    ROS Review of Systems  Constitutional:  Negative for chills, diaphoresis, fatigue, fever and unexpected weight change.  HENT: Negative.    Eyes:  Negative for photophobia and visual disturbance.  Respiratory:  Negative for chest tightness and shortness of breath.   Cardiovascular:  Negative for chest pain and palpitations.  Gastrointestinal: Negative.   Genitourinary:  Negative for difficulty urinating, dysuria, frequency and urgency.  Musculoskeletal:  Negative for gait problem.  Neurological:  Negative for dizziness, tremors, facial asymmetry, speech difficulty, weakness and headaches.     Objective:    Physical Exam Vitals and nursing note reviewed.  Constitutional:      General: He is not in acute distress.    Appearance: Normal appearance. He is normal weight. He is not ill-appearing, toxic-appearing or diaphoretic.  HENT:     Head: Normocephalic.     Right Ear: External ear normal.     Left Ear: Tympanic membrane and external ear normal.     Mouth/Throat:     Mouth: Mucous membranes are moist.     Pharynx: Oropharynx is clear. No oropharyngeal exudate or posterior  oropharyngeal erythema.  Eyes:     General: No visual field deficit or scleral icterus.       Right eye: No discharge.        Left eye: No discharge.     Extraocular Movements: Extraocular movements intact.     Conjunctiva/sclera: Conjunctivae normal.     Pupils: Pupils are equal, round, and reactive to light.  Neck:     Vascular: No carotid bruit.  Cardiovascular:  Rate and Rhythm: Normal rate and regular rhythm.  Pulmonary:     Effort: Pulmonary effort is normal.     Breath sounds: Normal breath sounds.  Abdominal:     General: Bowel sounds are normal.  Musculoskeletal:     Cervical back: No rigidity or tenderness.  Lymphadenopathy:     Cervical: No cervical adenopathy.  Skin:    General: Skin is warm and dry.  Neurological:     Mental Status: He is alert and oriented to person, place, and time.     Cranial Nerves: Cranial nerves 2-12 are intact. No cranial nerve deficit, dysarthria or facial asymmetry.  Psychiatric:        Mood and Affect: Mood normal.        Behavior: Behavior normal.    BP 121/68 (BP Location: Left Arm, Patient Position: Sitting, Cuff Size: Normal)   Pulse 70   Temp (!) 97 F (36.1 C) (Temporal)   Ht 5\' 7"  (1.702 m)   Wt 160 lb 9.6 oz (72.8 kg)   SpO2 98%   BMI 25.15 kg/m  Wt Readings from Last 3 Encounters:  04/21/21 160 lb 9.6 oz (72.8 kg)  01/24/21 160 lb 3.2 oz (72.7 kg)  07/26/20 162 lb 6.4 oz (73.7 kg)     Health Maintenance Due  Topic Date Due   TETANUS/TDAP  11/01/2018   COVID-19 Vaccine (4 - Booster for Pfizer series) 06/19/2020    There are no preventive care reminders to display for this patient.  Lab Results  Component Value Date   TSH 2.45 01/28/2021   Lab Results  Component Value Date   WBC 7.6 01/28/2021   HGB 16.2 01/28/2021   HCT 47.2 01/28/2021   MCV 90.9 01/28/2021   PLT 219.0 01/28/2021   Lab Results  Component Value Date   NA 139 01/28/2021   K 3.9 01/28/2021   CO2 25 01/28/2021   GLUCOSE 99  01/28/2021   BUN 12 01/28/2021   CREATININE 0.88 01/28/2021   BILITOT 1.5 (H) 01/28/2021   ALKPHOS 63 01/28/2021   AST 15 01/28/2021   ALT 12 01/28/2021   PROT 7.0 01/28/2021   ALBUMIN 4.1 01/28/2021   CALCIUM 9.1 01/28/2021   ANIONGAP 13 09/05/2019   GFR 79.16 01/28/2021   Lab Results  Component Value Date   CHOL 160 01/28/2021   Lab Results  Component Value Date   HDL 69.90 01/28/2021   Lab Results  Component Value Date   LDLCALC 77 01/28/2021   Lab Results  Component Value Date   TRIG 64.0 01/28/2021   Lab Results  Component Value Date   CHOLHDL 2 01/28/2021   Lab Results  Component Value Date   HGBA1C 5.5 03/25/2020      Assessment & Plan:   Problem List Items Addressed This Visit       Cardiovascular and Mediastinum   Essential hypertension   Relevant Orders   Basic metabolic panel   CBC   Urinalysis, Routine w reflex microscopic     Other   Lightheadedness - Primary   Relevant Orders   Basic metabolic panel   CBC   Urinalysis, Routine w reflex microscopic    No orders of the defined types were placed in this encounter.   Follow-up: Return if symptoms worsen or fail to improve.    Libby Maw, MD

## 2021-04-22 LAB — BASIC METABOLIC PANEL
BUN: 20 mg/dL (ref 6–23)
CO2: 26 mEq/L (ref 19–32)
Calcium: 9.7 mg/dL (ref 8.4–10.5)
Chloride: 104 mEq/L (ref 96–112)
Creatinine, Ser: 1.29 mg/dL (ref 0.40–1.50)
GFR: 50.96 mL/min — ABNORMAL LOW (ref >60.00)
Glucose, Bld: 142 mg/dL — ABNORMAL HIGH (ref 70–99)
Potassium: 4.1 mEq/L (ref 3.5–5.1)
Sodium: 142 mEq/L (ref 135–145)

## 2021-04-22 LAB — CBC
HCT: 45.2 % (ref 39.0–52.0)
Hemoglobin: 15.3 g/dL (ref 13.0–17.0)
MCHC: 33.9 g/dL (ref 30.0–36.0)
MCV: 91.4 fl (ref 78.0–100.0)
Platelets: 211 10*3/uL (ref 150.0–400.0)
RBC: 4.94 Mil/uL (ref 4.22–5.81)
RDW: 13.8 % (ref 11.5–15.5)
WBC: 8.6 10*3/uL (ref 4.0–10.5)

## 2021-04-22 LAB — URINALYSIS, ROUTINE W REFLEX MICROSCOPIC
Hgb urine dipstick: NEGATIVE
Leukocytes,Ua: NEGATIVE
Nitrite: NEGATIVE
RBC / HPF: NONE SEEN (ref 0–?)
Specific Gravity, Urine: 1.02 (ref 1.000–1.030)
Total Protein, Urine: NEGATIVE
Urine Glucose: NEGATIVE
Urobilinogen, UA: 1 (ref 0.0–1.0)
pH: 6 (ref 5.0–8.0)

## 2021-06-25 ENCOUNTER — Other Ambulatory Visit: Payer: Self-pay | Admitting: Family Medicine

## 2021-07-18 DIAGNOSIS — H2512 Age-related nuclear cataract, left eye: Secondary | ICD-10-CM | POA: Diagnosis not present

## 2021-07-29 ENCOUNTER — Encounter: Payer: Self-pay | Admitting: Family Medicine

## 2021-07-29 ENCOUNTER — Other Ambulatory Visit: Payer: Self-pay

## 2021-07-29 ENCOUNTER — Ambulatory Visit (INDEPENDENT_AMBULATORY_CARE_PROVIDER_SITE_OTHER): Payer: Medicare Other | Admitting: Family Medicine

## 2021-07-29 VITALS — BP 138/72 | HR 67 | Temp 97.0°F | Ht 67.0 in | Wt 159.4 lb

## 2021-07-29 DIAGNOSIS — E559 Vitamin D deficiency, unspecified: Secondary | ICD-10-CM

## 2021-07-29 DIAGNOSIS — I10 Essential (primary) hypertension: Secondary | ICD-10-CM | POA: Diagnosis not present

## 2021-07-29 DIAGNOSIS — I63541 Cerebral infarction due to unspecified occlusion or stenosis of right cerebellar artery: Secondary | ICD-10-CM | POA: Diagnosis not present

## 2021-07-29 DIAGNOSIS — E782 Mixed hyperlipidemia: Secondary | ICD-10-CM | POA: Diagnosis not present

## 2021-07-29 LAB — COMPREHENSIVE METABOLIC PANEL
ALT: 16 U/L (ref 0–53)
AST: 19 U/L (ref 0–37)
Albumin: 4.2 g/dL (ref 3.5–5.2)
Alkaline Phosphatase: 68 U/L (ref 39–117)
BUN: 10 mg/dL (ref 6–23)
CO2: 28 mEq/L (ref 19–32)
Calcium: 9.4 mg/dL (ref 8.4–10.5)
Chloride: 102 mEq/L (ref 96–112)
Creatinine, Ser: 0.96 mg/dL (ref 0.40–1.50)
GFR: 72.51 mL/min (ref >60.00)
Glucose, Bld: 102 mg/dL — ABNORMAL HIGH (ref 70–99)
Potassium: 3.7 mEq/L (ref 3.5–5.1)
Sodium: 137 mEq/L (ref 135–145)
Total Bilirubin: 1.6 mg/dL — ABNORMAL HIGH (ref 0.2–1.2)
Total Protein: 7 g/dL (ref 6.0–8.3)

## 2021-07-29 LAB — CBC
HCT: 48 % (ref 39.0–52.0)
Hemoglobin: 16.3 g/dL (ref 13.0–17.0)
MCHC: 34.1 g/dL (ref 30.0–36.0)
MCV: 89.3 fl (ref 78.0–100.0)
Platelets: 220 10*3/uL (ref 150.0–400.0)
RBC: 5.37 Mil/uL (ref 4.22–5.81)
RDW: 14 % (ref 11.5–15.5)
WBC: 5.8 10*3/uL (ref 4.0–10.5)

## 2021-07-29 LAB — URINALYSIS, ROUTINE W REFLEX MICROSCOPIC
Bilirubin Urine: NEGATIVE
Hgb urine dipstick: NEGATIVE
Ketones, ur: NEGATIVE
Leukocytes,Ua: NEGATIVE
Nitrite: NEGATIVE
RBC / HPF: NONE SEEN (ref 0–?)
Specific Gravity, Urine: <=1.005 — AB (ref 1.000–1.030)
Total Protein, Urine: NEGATIVE
Urine Glucose: NEGATIVE
Urobilinogen, UA: 1 (ref 0.0–1.0)
WBC, UA: NONE SEEN (ref 0–?)
pH: 6.5 (ref 5.0–8.0)

## 2021-07-29 LAB — VITAMIN D 25 HYDROXY (VIT D DEFICIENCY, FRACTURES): VITD: 50.34 ng/mL (ref 30.00–100.00)

## 2021-07-29 LAB — LIPID PANEL
Cholesterol: 158 mg/dL (ref 0–200)
HDL: 64 mg/dL (ref >39.00)
LDL Cholesterol: 79 mg/dL (ref 0–99)
NonHDL: 94.14
Total CHOL/HDL Ratio: 2
Triglycerides: 77 mg/dL (ref 0.0–149.0)
VLDL: 15.4 mg/dL (ref 0.0–40.0)

## 2021-07-29 LAB — LDL CHOLESTEROL, DIRECT: Direct LDL: 75 mg/dL

## 2021-07-29 NOTE — Progress Notes (Signed)
Established Patient Office Visit  Subjective:  Patient ID: Elijah [name], male    DOB: 19-Jun-1935  Age: 87 y.o. MRN: [id number]  CC:  Chief Complaint  Patient presents with   Follow-up    6 month follow up, no concerns. Patient fasting.     HPI Elijah [name] presents for follow-up of hypertension, elevated cholesterol with a history of cerebrovascular disease, vitamin D deficiency.  Reports squiggle lines across his visual fields associated with a frontal headache in the affected area.  He uses Afrin for treatment which relieves symptoms.  Recent eye check.  Results of recent MRI reviewed.  Consumes 1 alcoholic drink daily.  Wife concurs.  Past Medical History:  Diagnosis Date   Allergy    Arthritis    hands   Asthma    Elevated PSA    s/p neg biopsy   Enlarged prostate    Hyperlipidemia    Hypertension    Stroke [facility])    Wears hearing aid     Past Surgical History:  Procedure Laterality Date   CATARACT EXTRACTION W/PHACO Right 05/08/2015   Procedure: CATARACT EXTRACTION PHACO AND INTRAOCULAR LENS PLACEMENT ([place]);  Surgeon: Leandrew Koyanagi, MD;  Location: [place];  Service: Ophthalmology;  Laterality: Right;   PROSTATE BIOPSY     TONSILLECTOMY      Family History  Problem Relation Age of Onset   Heart disease Father    Stroke Mother    Cancer Sister     Social History   Socioeconomic History   Marital status: Married    Spouse name: Not on file   Number of children: 1   Years of education: Not on file   Highest education level: Not on file  Occupational History   Occupation: Retired Technical brewer: retired  Tobacco Use   Smoking status: Never   Smokeless tobacco: Never  Substance and Sexual Activity   Alcohol use: Yes    Alcohol/week: 14.0 standard drinks    Types: 14 Shots of liquor per week   Drug use: No   Sexual activity: Yes  Other Topics Concern   Not on file  Social History Narrative   One biological son, one  adopted son, two granddaughters, 8 & 24 yo      Has living will and HPOA- wife Elijah [name].   Would desire CPR.  Would not want prolonged life support.   Social Determinants of Health   Financial Resource Strain: Low Risk    Difficulty of Paying Living Expenses: Not very hard  Food Insecurity: No Food Insecurity   Worried About Charity fundraiser in the Last Year: Never true   Ran Out of Food in the Last Year: Never true  Transportation Needs: Not on file  Physical Activity: Sufficiently Active   Days of Exercise per Week: 5 days   Minutes of Exercise per Session: 40 min  Stress: No Stress Concern Present   Feeling of Stress : Not at all  Social Connections: Moderately Integrated   Frequency of Communication with Friends and Family: More than three times a week   Frequency of Social Gatherings with Friends and Family: More than three times a week   Attends Religious Services: More than 4 times per year   Active Member of [employer] or Organizations: No   Attends Archivist Meetings: Never   Marital Status: Married  Human resources officer Violence: Not At Risk   Fear of Current or Ex-Partner: No  Emotionally Abused: No   Physically Abused: No   Sexually Abused: No    Outpatient Medications Prior to Visit  Medication Sig Dispense Refill   albuterol (VENTOLIN HFA) 108 (90 Base) MCG/ACT inhaler Inhale 1-2 puffs into the lungs every 4 (four) hours as needed for wheezing or shortness of breath.     amLODipine (NORVASC) 10 MG tablet Take 1 tablet (10 mg total) by mouth daily. 90 tablet 3   aspirin EC 81 MG tablet Take 1 tablet (81 mg total) by mouth daily.     atorvastatin (LIPITOR) 40 MG tablet Take 1 tablet (40 mg total) by mouth daily at 6 PM. 90 tablet 3   cholecalciferol (VITAMIN D3) 25 MCG (1000 UNIT) tablet Take 2,000 Units by mouth every other day.      fluticasone (FLONASE) 50 MCG/ACT nasal spray USE 2 SPRAYS IN EACH NOSTRIL DURING ALLERGY SEASON. 48 mL 2   quinapril  (ACCUPRIL) 10 MG tablet Take 1 tablet (10 mg total) by mouth daily. 90 tablet 3   No facility-administered medications prior to visit.    Allergies  Allergen Reactions   Penicillins Hives    ROS Review of Systems  Constitutional:  Negative for diaphoresis, fatigue, fever and unexpected weight change.  HENT: Negative.    Eyes:  Negative for photophobia and visual disturbance.  Respiratory: Negative.    Cardiovascular: Negative.   Gastrointestinal: Negative.   Endocrine: Negative for polyphagia and polyuria.  Genitourinary: Negative.   Musculoskeletal:  Negative for gait problem and joint swelling.  Neurological:  Negative for speech difficulty and weakness.  Hematological:  Does not bruise/bleed easily.  Psychiatric/Behavioral: Negative.       Objective:    Physical Exam Vitals and nursing note reviewed.  Constitutional:      General: He is not in acute distress.    Appearance: Normal appearance. He is normal weight. He is not ill-appearing, toxic-appearing or diaphoretic.  HENT:     Head: Normocephalic and atraumatic.     Right Ear: External ear normal.     Left Ear: External ear normal.     Mouth/Throat:     Mouth: Mucous membranes are dry.     Pharynx: Oropharynx is clear. No oropharyngeal exudate or posterior oropharyngeal erythema.  Eyes:     General: No scleral icterus.       Right eye: No discharge.        Left eye: No discharge.     Extraocular Movements: Extraocular movements intact.     Conjunctiva/sclera: Conjunctivae normal.     Pupils: Pupils are equal, round, and reactive to light.  Neck:     Vascular: No carotid bruit.  Cardiovascular:     Rate and Rhythm: Normal rate and regular rhythm.  Pulmonary:     Effort: Pulmonary effort is normal.     Breath sounds: Normal breath sounds.  Abdominal:     General: Bowel sounds are normal.  Musculoskeletal:     Cervical back: No rigidity or tenderness.  Lymphadenopathy:     Cervical: No cervical  adenopathy.  Skin:    General: Skin is warm and dry.       Neurological:     Mental Status: He is alert and oriented to person, place, and time.     Cranial Nerves: No dysarthria or facial asymmetry.     Motor: No weakness.  Psychiatric:        Mood and Affect: Mood normal.        Behavior: Behavior normal.  BP 138/72 (BP Location: Left Arm, Patient Position: Sitting, Cuff Size: Normal)    Pulse 67    Temp (!) 97 F (36.1 C) (Temporal)    Ht 5\' 7"  (1.702 m)    Wt 159 lb 6.4 oz (72.3 kg)    SpO2 99%    BMI 24.97 kg/m  Wt Readings from Last 3 Encounters:  07/29/21 159 lb 6.4 oz (72.3 kg)  04/21/21 160 lb 9.6 oz (72.8 kg)  01/24/21 160 lb 3.2 oz (72.7 kg)     Health Maintenance Due  Topic Date Due   TETANUS/TDAP  11/01/2018    There are no preventive care reminders to display for this patient.  Lab Results  Component Value Date   TSH 2.45 01/28/2021   Lab Results  Component Value Date   WBC 8.6 04/21/2021   HGB 15.3 04/21/2021   HCT 45.2 04/21/2021   MCV 91.4 04/21/2021   PLT 211.0 04/21/2021   Lab Results  Component Value Date   NA 142 04/21/2021   K 4.1 04/21/2021   CO2 26 04/21/2021   GLUCOSE 142 (H) 04/21/2021   BUN 20 04/21/2021   CREATININE 1.29 04/21/2021   BILITOT 1.5 (H) 01/28/2021   ALKPHOS 63 01/28/2021   AST 15 01/28/2021   ALT 12 01/28/2021   PROT 7.0 01/28/2021   ALBUMIN 4.1 01/28/2021   CALCIUM 9.7 04/21/2021   ANIONGAP 13 09/05/2019   GFR 50.96 (L) 04/21/2021   Lab Results  Component Value Date   CHOL 160 01/28/2021   Lab Results  Component Value Date   HDL 69.90 01/28/2021   Lab Results  Component Value Date   LDLCALC 77 01/28/2021   Lab Results  Component Value Date   TRIG 64.0 01/28/2021   Lab Results  Component Value Date   CHOLHDL 2 01/28/2021   Lab Results  Component Value Date   HGBA1C 5.5 03/25/2020      Assessment & Plan:   Problem List Items Addressed This Visit       Cardiovascular and Mediastinum    Essential hypertension - Primary   Relevant Orders   CBC   Comprehensive metabolic panel   Urinalysis, Routine w reflex microscopic   CVA (cerebral vascular accident) ([place])     Other   Vitamin D deficiency   Relevant Orders   VITAMIN D 25 Hydroxy (Vit-D Deficiency, Fractures)   HLD (hyperlipidemia)   Relevant Orders   Comprehensive metabolic panel   LDL cholesterol, direct   Lipid panel    No orders of the defined types were placed in this encounter.   Follow-up: Return in about 6 months (around 01/26/2022), or if symptoms worsen or fail to improve, for MAY NEED TO INCREASE DOSE OF ATORVASTATIN. Marland Kitchen   Advised patient to avoid using Afrin on a regular basis.  Please RTC if visual phenomenon worsen. Libby Maw, MD

## 2021-08-01 DIAGNOSIS — D2262 Melanocytic nevi of left upper limb, including shoulder: Secondary | ICD-10-CM | POA: Diagnosis not present

## 2021-08-01 DIAGNOSIS — D2271 Melanocytic nevi of right lower limb, including hip: Secondary | ICD-10-CM | POA: Diagnosis not present

## 2021-08-01 DIAGNOSIS — D2261 Melanocytic nevi of right upper limb, including shoulder: Secondary | ICD-10-CM | POA: Diagnosis not present

## 2021-08-01 DIAGNOSIS — Z85820 Personal history of malignant melanoma of skin: Secondary | ICD-10-CM | POA: Diagnosis not present

## 2021-08-01 DIAGNOSIS — I890 Lymphedema, not elsewhere classified: Secondary | ICD-10-CM | POA: Diagnosis not present

## 2021-08-13 IMAGING — MR MR HEAD W/O CM
12 of 13 series · 44 of 48 positions shown · non-contrast
Comparison: No pertinent prior studies available for comparison.
COMPARISON: No pertinent prior studies available for comparison.

Addendum:
CLINICAL DATA: Dizziness, rule out stroke; headache, intracranial
hemorrhage suspected.

EXAM:
MRI HEAD WITHOUT CONTRAST
TECHNIQUE: Multiplanar, multiecho pulse sequences of the brain and surrounding
structures were obtained without intravenous contrast.

[Series 5: DWI · axial · 3.0mm · 0.88mm/px · z∈[-101,+48]mm · 8 of 102 slices shown (1 of 4)]
[im 1/102]
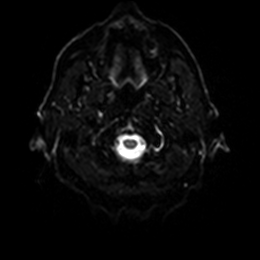
[im 15/102]
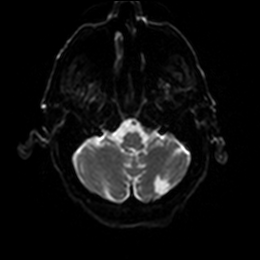
[im 29/102]
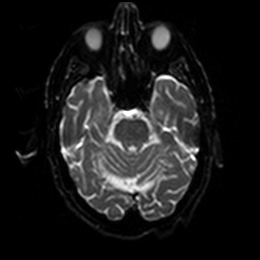
[im 44/102]
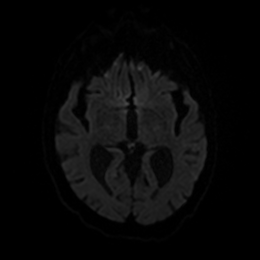
[im 58/102]
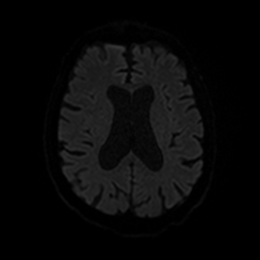
[im 73/102]
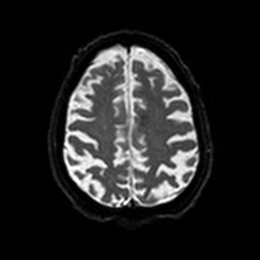
[im 87/102]
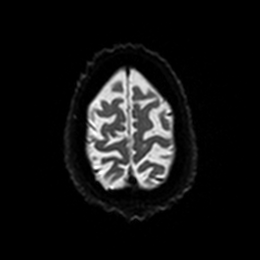
[im 102/102]
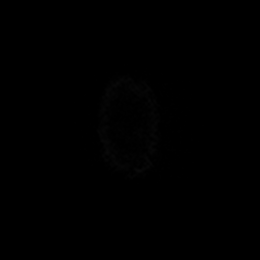

[Series 6: DWI · axial · 3.0mm · 0.88mm/px · z∈[-101,+48]mm · 4 of 51 slices shown (2 of 4)]
[im 1/51]
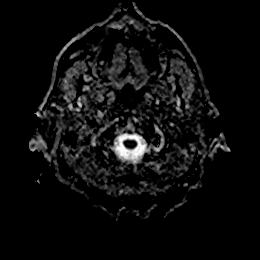
[im 17/51]
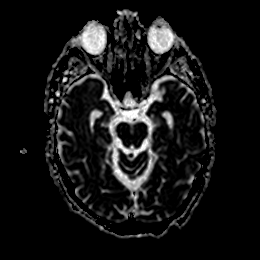
[im 34/51]
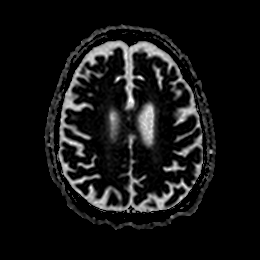
[im 51/51]
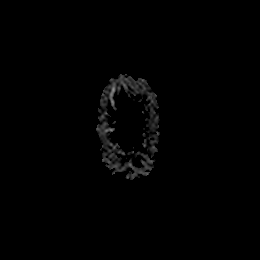

[Series 7: DWI · coronal · 4.0mm · 0.88mm/px · 5 of 72 slices shown (3 of 4)]
[im 1/72]
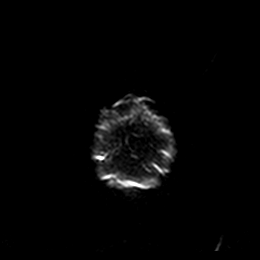
[im 18/72]
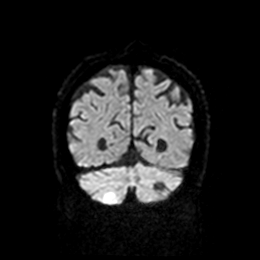
[im 36/72]
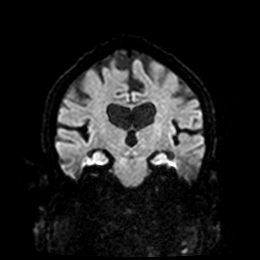
[im 54/72]
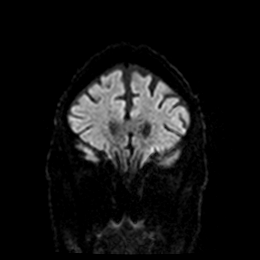
[im 72/72]
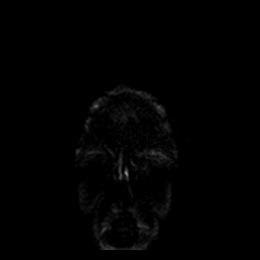

[Series 8: DWI · coronal · 4.0mm · 0.88mm/px · 3 of 36 slices shown (4 of 4)]
[im 1/36]
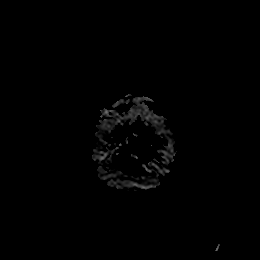
[im 18/36]
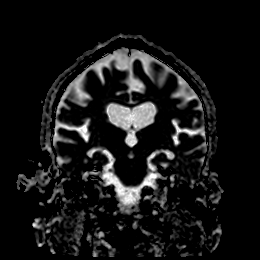
[im 36/36]
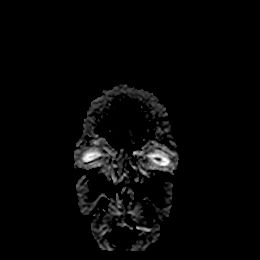

[Series 9: T1 · sagittal · 5.0mm · 0.75mm/px · 2 of 25 slices shown]
[im 1/25]
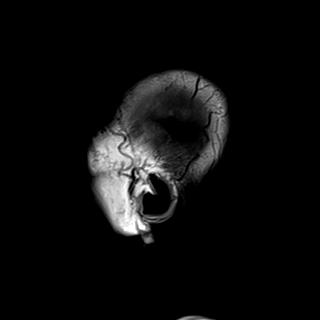
[im 25/25]
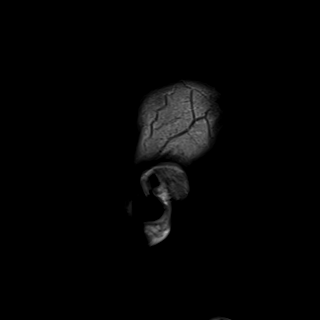

[Series 10: mag_images · axial · 3.0mm · 0.90mm/px · z∈[-114,+63]mm · 4 of 60 slices shown]
[im 1/60]
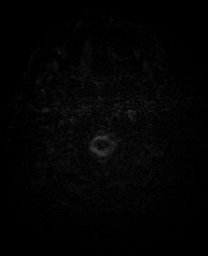
[im 20/60]
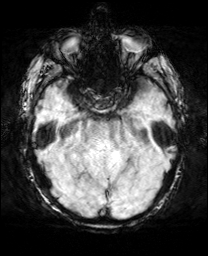
[im 40/60]
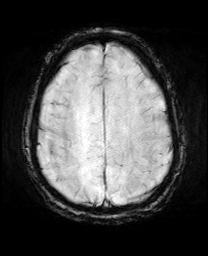
[im 60/60]
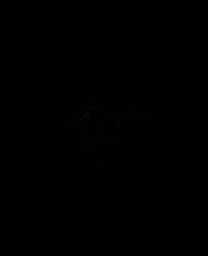

[Series 11: pha_images · axial · 3.0mm · 0.90mm/px · z∈[-114,+60]mm · 4 of 55 slices shown]
[im 1/55]
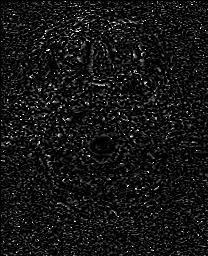
[im 19/55]
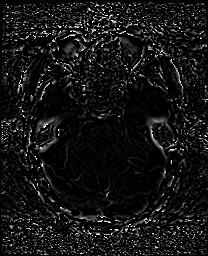
[im 37/55]
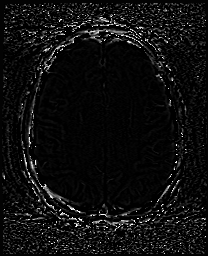
[im 55/55]
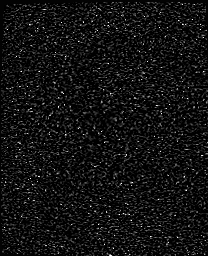

[Series 12: swi_images · axial · 3.0mm · 0.90mm/px · z∈[-114,+63]mm · 4 of 60 slices shown]
[im 1/60]
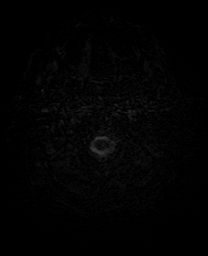
[im 20/60]
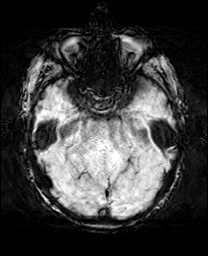
[im 40/60]
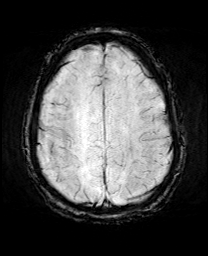
[im 60/60]
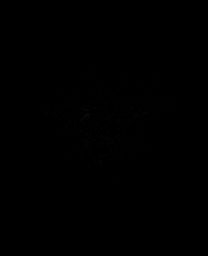

[Series 13: mip_images(sw) · axial · 24.0mm · 0.90mm/px · z∈[-104,+52]mm · 4 of 53 slices shown]
[im 1/53]
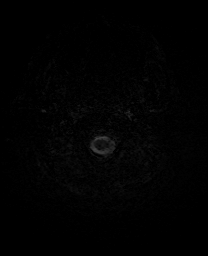
[im 18/53]
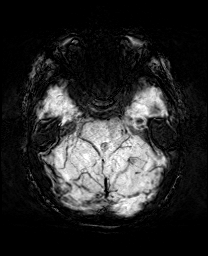
[im 35/53]
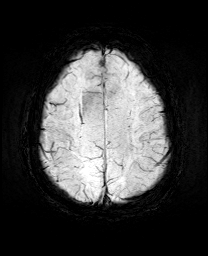
[im 53/53]
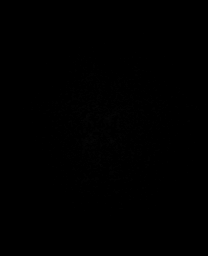

[Series 14: T2 · axial · 5.0mm · 0.72mm/px · z∈[-101,+48]mm · 2 of 26 slices shown (1 of 2)]
[im 1/26]
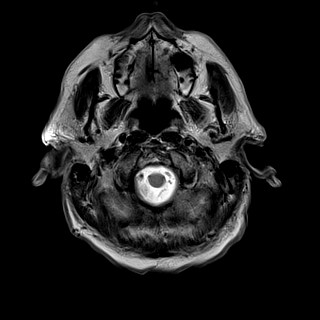
[im 26/26]
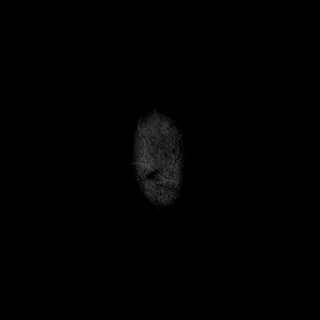

[Series 15: FLAIR · axial · 5.0mm · 0.45mm/px · z∈[-101,+49]mm · 2 of 26 slices shown]
[im 1/26]
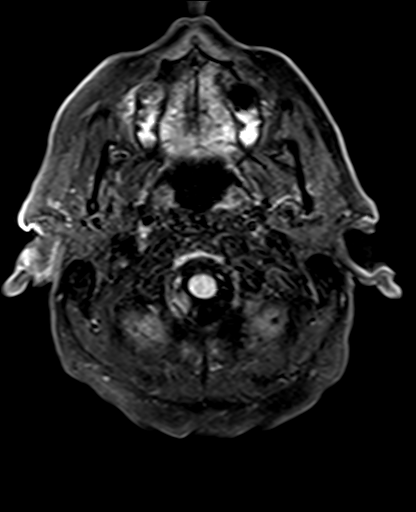
[im 26/26]
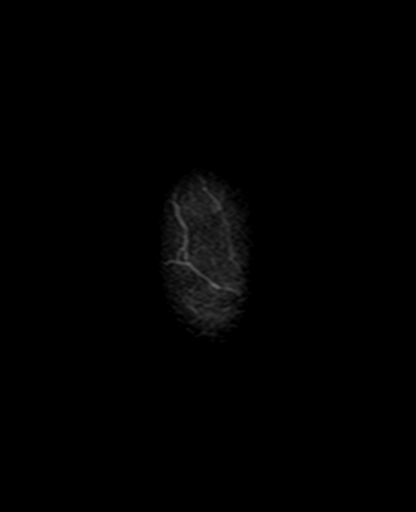

[Series 17: T2 · coronal · 5.0mm · 0.34mm/px · 2 of 31 slices shown (2 of 2)]
[im 1/31]
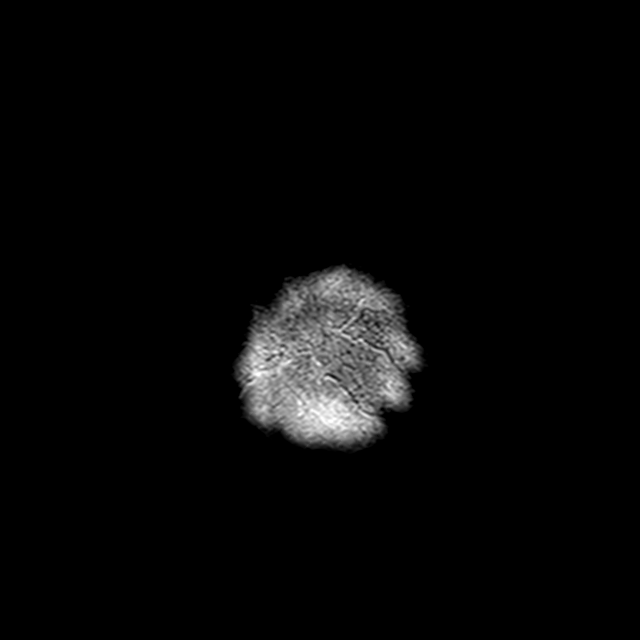
[im 31/31]
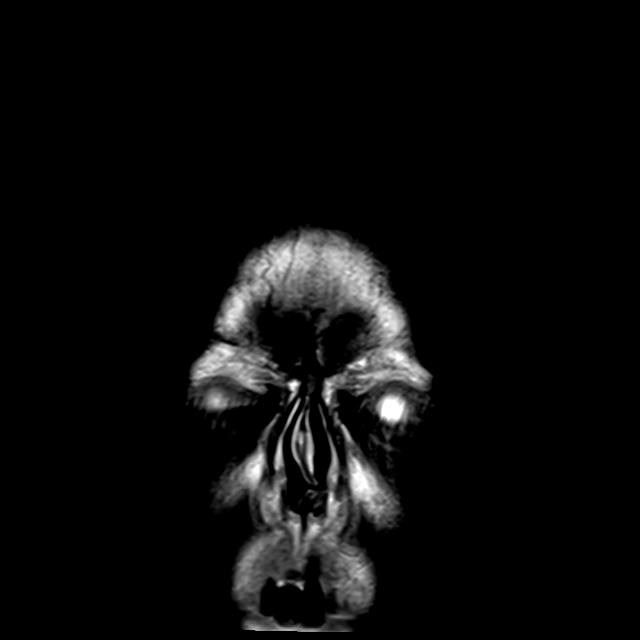

[44 of 48 positions shown; findings below may reference images not displayed]

FINDINGS: Brain:

There is a moderate-sized focus of restricted diffusion within the
right cerebellum in the right PICA vascular territory consistent
with acute infarction. There is an additional punctate focus of
acute infarction within the superior right cerebellum (series 5,
image 60).

No evidence of intracranial mass.

No midline shift or extra-axial fluid collection.

No chronic intracranial blood products.

Chronic left cerebellar infarcts.

Minimal scattered T2/FLAIR hyperintensity within the cerebral white
matter is nonspecific, but consistent with chronic small vessel
ischemic disease.

Moderate generalized parenchymal atrophy.

Vascular: Abnormal appearance of the intracranial left vertebral
artery which may reflect high-grade stenosis or occlusion. Flow
voids otherwise maintained within the proximal large arterial
vessels.

Skull and upper cervical spine: No focal marrow lesion. Incompletely
assessed upper cervical spondylosis.

Sinuses/Orbits: Prior right lens replacement. No significant
paranasal sinus disease or mastoid effusion
IMPRESSION: Moderate-sized acute infarct within the right cerebellum in the
right PICA vascular territory. Additional punctate acute infarct
more superiorly within the right cerebellum.

Chronic left cerebellar infarcts.

Background moderate generalized parenchymal atrophy with mild
chronic small vessel ischemic disease.

Abnormal appearance of the intracranial left vertebral artery which
may reflect high-grade stenosis or occlusion.

ADDENDUM:
These results were called by telephone at the time of interpretation
on 09/03/2019 at [DATE] to provider RIRA SUNSERI , who verbally
acknowledged these results.

*** End of Addendum ***
FINDINGS: Brain:

There is a moderate-sized focus of restricted diffusion within the
right cerebellum in the right PICA vascular territory consistent
with acute infarction. There is an additional punctate focus of
acute infarction within the superior right cerebellum (series 5,
image 60).

No evidence of intracranial mass.

No midline shift or extra-axial fluid collection.

No chronic intracranial blood products.

Chronic left cerebellar infarcts.

Minimal scattered T2/FLAIR hyperintensity within the cerebral white
matter is nonspecific, but consistent with chronic small vessel
ischemic disease.

Moderate generalized parenchymal atrophy.

Vascular: Abnormal appearance of the intracranial left vertebral
artery which may reflect high-grade stenosis or occlusion. Flow
voids otherwise maintained within the proximal large arterial
vessels.

Skull and upper cervical spine: No focal marrow lesion. Incompletely
assessed upper cervical spondylosis.

Sinuses/Orbits: Prior right lens replacement. No significant
paranasal sinus disease or mastoid effusion
IMPRESSION: Moderate-sized acute infarct within the right cerebellum in the
right PICA vascular territory. Additional punctate acute infarct
more superiorly within the right cerebellum.

Chronic left cerebellar infarcts.

Background moderate generalized parenchymal atrophy with mild
chronic small vessel ischemic disease.

Abnormal appearance of the intracranial left vertebral artery which
may reflect high-grade stenosis or occlusion.

## 2021-09-02 ENCOUNTER — Telehealth: Payer: Self-pay | Admitting: Family Medicine

## 2021-09-02 DIAGNOSIS — E7800 Pure hypercholesterolemia, unspecified: Secondary | ICD-10-CM

## 2021-09-02 DIAGNOSIS — I10 Essential (primary) hypertension: Secondary | ICD-10-CM

## 2021-09-02 MED ORDER — QUINAPRIL HCL 10 MG PO TABS: 10.0000 mg | ORAL_TABLET | Freq: Every day | ORAL | 3 refills | Status: DC

## 2021-09-02 MED ORDER — AMLODIPINE BESYLATE 10 MG PO TABS: 10.0000 mg | ORAL_TABLET | Freq: Every day | ORAL | 3 refills | Status: DC

## 2021-09-02 MED ORDER — ATORVASTATIN CALCIUM 40 MG PO TABS: 40.0000 mg | ORAL_TABLET | Freq: Every day | ORAL | 3 refills | Status: DC

## 2021-09-02 NOTE — Telephone Encounter (Signed)
Caller Name: riyansh Venneman ?Call back phone #: [phone] ? ?MEDICATION(S): quinapril (ACCUPRIL) 10 MG tablet [[id number]], amLODipine (NORVASC) 10 MG tablet [[id number]] and atorvastatin (LIPITOR) 40 MG tablet [[id number] ? ? ?Days of Med Remaining:  ? ?Has the patient contacted their pharmacy (YES/NO)?  yes ?IF YES, when and what did the pharmacy advise? He was told to call his PCP. ?IF NO, request that the patient contact the pharmacy for the refills in the future.  ?           The pharmacy will send an electronic request (except for controlled medications). ? ?Preferred Pharmacy:[id number] [place] [address] [id number] ?Phone:  [phone] Fax:  [phone] ?DEA #:  [id number]? ?~~~Please advise patient/caregiver to allow 2-3 business days to process RX refills. ? ?

## 2021-11-30 ENCOUNTER — Other Ambulatory Visit: Payer: Self-pay | Admitting: Family Medicine

## 2021-11-30 DIAGNOSIS — I10 Essential (primary) hypertension: Secondary | ICD-10-CM

## 2022-01-23 ENCOUNTER — Encounter (HOSPITAL_COMMUNITY): Payer: Self-pay | Admitting: Neurology

## 2022-01-23 ENCOUNTER — Other Ambulatory Visit: Payer: Self-pay

## 2022-01-23 ENCOUNTER — Emergency Department (HOSPITAL_COMMUNITY): Payer: Medicare Other

## 2022-01-23 ENCOUNTER — Inpatient Hospital Stay (HOSPITAL_COMMUNITY)
Admission: EM | Admit: 2022-01-23 | Discharge: 2022-01-26 | DRG: 063 | Disposition: A | Discharge status: Home / Self Care | Payer: Medicare Other | Attending: Neurology | Admitting: Neurology

## 2022-01-23 DIAGNOSIS — E559 Vitamin D deficiency, unspecified: Secondary | ICD-10-CM | POA: Diagnosis present

## 2022-01-23 DIAGNOSIS — R42 Dizziness and giddiness: Secondary | ICD-10-CM | POA: Diagnosis not present

## 2022-01-23 DIAGNOSIS — Z823 Family history of stroke: Secondary | ICD-10-CM | POA: Diagnosis not present

## 2022-01-23 DIAGNOSIS — I6503 Occlusion and stenosis of bilateral vertebral arteries: Secondary | ICD-10-CM | POA: Diagnosis not present

## 2022-01-23 DIAGNOSIS — Z7982 Long term (current) use of aspirin: Secondary | ICD-10-CM | POA: Diagnosis not present

## 2022-01-23 DIAGNOSIS — R110 Nausea: Secondary | ICD-10-CM | POA: Diagnosis not present

## 2022-01-23 DIAGNOSIS — E785 Hyperlipidemia, unspecified: Secondary | ICD-10-CM | POA: Diagnosis present

## 2022-01-23 DIAGNOSIS — Z8249 Family history of ischemic heart disease and other diseases of the circulatory system: Secondary | ICD-10-CM | POA: Diagnosis not present

## 2022-01-23 DIAGNOSIS — Z8673 Personal history of transient ischemic attack (TIA), and cerebral infarction without residual deficits: Secondary | ICD-10-CM | POA: Diagnosis not present

## 2022-01-23 DIAGNOSIS — R61 Generalized hyperhidrosis: Secondary | ICD-10-CM | POA: Diagnosis not present

## 2022-01-23 DIAGNOSIS — J45909 Unspecified asthma, uncomplicated: Secondary | ICD-10-CM | POA: Diagnosis present

## 2022-01-23 DIAGNOSIS — I6523 Occlusion and stenosis of bilateral carotid arteries: Secondary | ICD-10-CM | POA: Diagnosis present

## 2022-01-23 DIAGNOSIS — I10 Essential (primary) hypertension: Secondary | ICD-10-CM | POA: Diagnosis present

## 2022-01-23 DIAGNOSIS — R270 Ataxia, unspecified: Secondary | ICD-10-CM | POA: Diagnosis present

## 2022-01-23 DIAGNOSIS — R55 Syncope and collapse: Secondary | ICD-10-CM | POA: Diagnosis not present

## 2022-01-23 DIAGNOSIS — I651 Occlusion and stenosis of basilar artery: Secondary | ICD-10-CM | POA: Diagnosis present

## 2022-01-23 DIAGNOSIS — H5500 Unspecified nystagmus: Secondary | ICD-10-CM | POA: Diagnosis present

## 2022-01-23 DIAGNOSIS — Z79899 Other long term (current) drug therapy: Secondary | ICD-10-CM

## 2022-01-23 DIAGNOSIS — Z880 Allergy status to penicillin: Secondary | ICD-10-CM | POA: Diagnosis not present

## 2022-01-23 DIAGNOSIS — E876 Hypokalemia: Secondary | ICD-10-CM | POA: Diagnosis present

## 2022-01-23 DIAGNOSIS — H538 Other visual disturbances: Secondary | ICD-10-CM | POA: Diagnosis present

## 2022-01-23 DIAGNOSIS — I771 Stricture of artery: Secondary | ICD-10-CM | POA: Diagnosis not present

## 2022-01-23 DIAGNOSIS — I639 Cerebral infarction, unspecified: Secondary | ICD-10-CM | POA: Diagnosis not present

## 2022-01-23 DIAGNOSIS — T679XXA Effect of heat and light, unspecified, initial encounter: Secondary | ICD-10-CM | POA: Diagnosis not present

## 2022-01-23 DIAGNOSIS — I6389 Other cerebral infarction: Secondary | ICD-10-CM | POA: Diagnosis not present

## 2022-01-23 DIAGNOSIS — G319 Degenerative disease of nervous system, unspecified: Secondary | ICD-10-CM | POA: Diagnosis not present

## 2022-01-23 LAB — CBC
HCT: 39.8 % (ref 39.0–52.0)
Hemoglobin: 14.5 g/dL (ref 13.0–17.0)
MCH: 31 pg (ref 26.0–34.0)
MCHC: 36.4 g/dL — ABNORMAL HIGH (ref 30.0–36.0)
MCV: 85 fL (ref 80.0–100.0)
Platelets: 236 10*3/uL (ref 150–400)
RBC: 4.68 MIL/uL (ref 4.22–5.81)
RDW: 12.6 % (ref 11.5–15.5)
WBC: 8.9 10*3/uL (ref 4.0–10.5)
nRBC: 0 % (ref 0.0–0.2)

## 2022-01-23 LAB — URINALYSIS, ROUTINE W REFLEX MICROSCOPIC
Bilirubin Urine: NEGATIVE
Glucose, UA: NEGATIVE mg/dL
Hgb urine dipstick: NEGATIVE
Ketones, ur: NEGATIVE mg/dL
Leukocytes,Ua: NEGATIVE
Nitrite: NEGATIVE
Protein, ur: NEGATIVE mg/dL
Specific Gravity, Urine: 1.025 (ref 1.005–1.030)
pH: 6 (ref 5.0–8.0)

## 2022-01-23 LAB — I-STAT CHEM 8, ED
BUN: 23 mg/dL (ref 8–23)
Calcium, Ion: 1.01 mmol/L — ABNORMAL LOW (ref 1.15–1.40)
Chloride: 110 mmol/L (ref 98–111)
Creatinine, Ser: 1.3 mg/dL — ABNORMAL HIGH (ref 0.61–1.24)
Glucose, Bld: 133 mg/dL — ABNORMAL HIGH (ref 70–99)
HCT: 39 % (ref 39.0–52.0)
Hemoglobin: 13.3 g/dL (ref 13.0–17.0)
Potassium: 2.6 mmol/L — CL (ref 3.5–5.1)
Sodium: 142 mmol/L (ref 135–145)
TCO2: 20 mmol/L — ABNORMAL LOW (ref 22–32)

## 2022-01-23 LAB — DIFFERENTIAL
Abs Immature Granulocytes: 0.05 10*3/uL (ref 0.00–0.07)
Basophils Absolute: 0.1 10*3/uL (ref 0.0–0.1)
Basophils Relative: 1 %
Eosinophils Absolute: 0.3 10*3/uL (ref 0.0–0.5)
Eosinophils Relative: 3 %
Immature Granulocytes: 1 %
Lymphocytes Relative: 31 %
Lymphs Abs: 2.8 10*3/uL (ref 0.7–4.0)
Monocytes Absolute: 0.9 10*3/uL (ref 0.1–1.0)
Monocytes Relative: 10 %
Neutro Abs: 4.8 10*3/uL (ref 1.7–7.7)
Neutrophils Relative %: 54 %

## 2022-01-23 LAB — ETHANOL: Alcohol, Ethyl (B): <10 mg/dL (ref <10)

## 2022-01-23 LAB — RAPID URINE DRUG SCREEN, HOSP PERFORMED
Amphetamines: NOT DETECTED
Barbiturates: NOT DETECTED
Benzodiazepines: NOT DETECTED
Cocaine: NOT DETECTED
Opiates: NOT DETECTED
Tetrahydrocannabinol: NOT DETECTED

## 2022-01-23 LAB — COMPREHENSIVE METABOLIC PANEL
ALT: 13 U/L (ref 0–44)
AST: 19 U/L (ref 15–41)
Albumin: 3.6 g/dL (ref 3.5–5.0)
Alkaline Phosphatase: 56 U/L (ref 38–126)
Anion gap: 10 (ref 5–15)
BUN: 21 mg/dL (ref 8–23)
CO2: 19 mmol/L — ABNORMAL LOW (ref 22–32)
Calcium: 8.4 mg/dL — ABNORMAL LOW (ref 8.9–10.3)
Chloride: 112 mmol/L — ABNORMAL HIGH (ref 98–111)
Creatinine, Ser: 1.41 mg/dL — ABNORMAL HIGH (ref 0.61–1.24)
GFR, Estimated: 49 mL/min — ABNORMAL LOW (ref >60)
Glucose, Bld: 136 mg/dL — ABNORMAL HIGH (ref 70–99)
Potassium: 2.6 mmol/L — CL (ref 3.5–5.1)
Sodium: 141 mmol/L (ref 135–145)
Total Bilirubin: 1.4 mg/dL — ABNORMAL HIGH (ref 0.3–1.2)
Total Protein: 6.1 g/dL — ABNORMAL LOW (ref 6.5–8.1)

## 2022-01-23 LAB — CBG MONITORING, ED: Glucose-Capillary: 133 mg/dL — ABNORMAL HIGH (ref 70–99)

## 2022-01-23 LAB — PHOSPHORUS: Phosphorus: 1.9 mg/dL — ABNORMAL LOW (ref 2.5–4.6)

## 2022-01-23 LAB — PROTIME-INR
INR: 1.1 (ref 0.8–1.2)
Prothrombin Time: 13.6 seconds (ref 11.4–15.2)

## 2022-01-23 LAB — MAGNESIUM: Magnesium: 1.4 mg/dL — ABNORMAL LOW (ref 1.7–2.4)

## 2022-01-23 LAB — GLUCOSE, CAPILLARY: Glucose-Capillary: 124 mg/dL — ABNORMAL HIGH (ref 70–99)

## 2022-01-23 LAB — HEMOGLOBIN A1C
Hgb A1c MFr Bld: 5.4 % (ref 4.8–5.6)
Mean Plasma Glucose: 108.28 mg/dL

## 2022-01-23 LAB — APTT: aPTT: <20 seconds — ABNORMAL LOW (ref 24–36)

## 2022-01-23 LAB — MRSA NEXT GEN BY PCR, NASAL: MRSA by PCR Next Gen: DETECTED — AB

## 2022-01-23 MED ORDER — STROKE: EARLY STAGES OF RECOVERY BOOK
Freq: Once | Status: AC
Start: 2022-01-24 — End: 2022-01-24
  Filled 2022-01-23: qty 1

## 2022-01-23 MED ORDER — TENECTEPLASE FOR STROKE
0.2500 mg/kg | PACK | Freq: Once | INTRAVENOUS | Status: AC
  Administered 2022-01-23: 17 mg via INTRAVENOUS
  Filled 2022-01-23: qty 10

## 2022-01-23 MED ORDER — ACETAMINOPHEN 650 MG RE SUPP: 650.0000 mg | RECTAL | Status: DC | PRN

## 2022-01-23 MED ORDER — ACETAMINOPHEN 160 MG/5ML PO SOLN: 650.0000 mg | ORAL | Status: DC | PRN

## 2022-01-23 MED ORDER — SODIUM CHLORIDE 0.9 % IV SOLN: INTRAVENOUS | Status: DC

## 2022-01-23 MED ORDER — CLEVIDIPINE BUTYRATE 0.5 MG/ML IV EMUL: 0.0000 mg/h | INTRAVENOUS | Status: DC

## 2022-01-23 MED ORDER — SENNOSIDES-DOCUSATE SODIUM 8.6-50 MG PO TABS: 1.0000 | ORAL_TABLET | Freq: Every evening | ORAL | Status: DC | PRN

## 2022-01-23 MED ORDER — ATORVASTATIN CALCIUM 40 MG PO TABS
40.0000 mg | ORAL_TABLET | Freq: Every day | ORAL | Status: DC
  Administered 2022-01-23 – 2022-01-25 (×3): 40 mg via ORAL
  Filled 2022-01-23 (×3): qty 1

## 2022-01-23 MED ORDER — ACETAMINOPHEN 325 MG PO TABS: 650.0000 mg | ORAL_TABLET | ORAL | Status: DC | PRN

## 2022-01-23 MED ORDER — POTASSIUM CHLORIDE CRYS ER 20 MEQ PO TBCR
40.0000 meq | EXTENDED_RELEASE_TABLET | Freq: Once | ORAL | Status: AC
  Administered 2022-01-23: 40 meq via ORAL
  Filled 2022-01-23: qty 2

## 2022-01-23 MED ORDER — IOHEXOL 350 MG/ML SOLN
75.0000 mL | Freq: Once | INTRAVENOUS | Status: AC | PRN
  Administered 2022-01-23: 75 mL via INTRAVENOUS

## 2022-01-23 MED ORDER — PANTOPRAZOLE SODIUM 40 MG IV SOLR
40.0000 mg | Freq: Every day | INTRAVENOUS | Status: DC
  Administered 2022-01-23: 40 mg via INTRAVENOUS
  Filled 2022-01-23 (×2): qty 10

## 2022-01-23 MED ORDER — LACTATED RINGERS IV BOLUS
500.0000 mL | Freq: Once | INTRAVENOUS | Status: AC
  Administered 2022-01-23: 500 mL via INTRAVENOUS

## 2022-01-23 MED ORDER — POTASSIUM CHLORIDE 10 MEQ/100ML IV SOLN
10.0000 meq | INTRAVENOUS | Status: AC
  Administered 2022-01-23 (×4): 10 meq via INTRAVENOUS
  Filled 2022-01-23 (×4): qty 100

## 2022-01-23 NOTE — Progress Notes (Signed)
PHARMACIST CODE STROKE RESPONSE  Notified to mix TNK at 1707 by Dr. Leonel Ramsay TNK preparation completed at 1710 TNK given at 1710  TNK dose = 17 mg IV over 5 seconds  Issues/delays encountered (if applicable): None relevant to medication preparation or administration  Elijah [name] 01/23/22 5:10 PM

## 2022-01-23 NOTE — H&P (Addendum)
Neurology H&P  CC: Code stroke, dizziness and syncope  History is obtained from:patient and medical record   HPI: Elijah [name] is a 87 y.o. male with past medical history of HTN, HLD, stroke, asthma and vitamin d deficiency who presents to [facility] ED via EMS for acute onset of dizziness and syncopal episode.   LKW 1545. CT head with no acute process. CTA head/neck progressive high-grade stenosis or occlusion of the distal left V4 segment; Moderate stenosis of the distal right V4 segment; Progressive attenuation of the basilar artery with high-grade proximal stenosis.Stable occlusion of the proximal vertebral arteries bilaterally. NIHSS 0, however patient with significant dizziness and inability to walk and nystagmus. He was deemed appropriate for administration of TNK due to disabling deficits. He consented to TNK. BP 141/89, CBG 133. TNK was administered at 25  LKW: 1545 tpa given?: yes Modified Rankin Scale: 0-Completely asymptomatic and back to baseline post- stroke  ROS: A 14 point ROS was performed and is negative except as noted in the HPI   Past Medical History:  Diagnosis Date   Allergy    Arthritis    hands   Asthma    Elevated PSA    s/p neg biopsy   Enlarged prostate    Hyperlipidemia    Hypertension    Stroke [facility]    Wears hearing aid      Family History  Problem Relation Age of Onset   Heart disease Father    Stroke Mother    Cancer Sister      Social History:  reports that he has never smoked. He has never used smokeless tobacco. He reports current alcohol use of about 14.0 standard drinks of alcohol per week. He reports that he does not use drugs.   Exam: Current vital signs: BP 134/66   Pulse 72   Resp 18   Wt 69.6 kg   BMI 24.03 kg/m  Vital signs in last 24 hours: Pulse Rate:  [72] 72 (07/28 1650) Resp:  [18] 18 (07/28 1650) BP: (134)/(66) 134/66 (07/28 1650) Weight:  [69.6 kg] 69.6 kg (07/28 1652)  Physical Exam  Constitutional: Appears  well-developed and well-nourished.  Psych: Affect appropriate to situation Eyes: No scleral injection HENT: No OP obstrucion Head: Normocephalic.  Cardiovascular: Normal rate and regular rhythm.  Respiratory: Effort normal and breath sounds normal to anterior ascultation GI: Soft.  No distension. There is no tenderness.  Skin: WDI  Neuro: Mental Status: Patient is awake, alert, oriented to person, place, month, year, and situation. Patient is able to give a clear and coherent history. No signs of aphasia or neglect Cranial Nerves: II: Visual Fields are full, nystagmus Pupils are equal, round, and reactive to light.   III,IV, VI: EOMI without ptosis or diploplia.  V: Facial sensation is symmetric to temperature VII: Facial movement is symmetric.  VIII: hearing is intact to voice X: Uvula elevates symmetrically XI: Shoulder shrug is symmetric. XII: tongue is midline without atrophy or fasciculations.  Motor: Tone is normal. Bulk is normal. 5/5 strength was present in all four extremities.  Sensory: Sensation is symmetric to light touch and temperature in the arms and legs. Cerebellar: FNF and HKS are intact bilaterally  NIHSS: 1a Level of Conscious.: 0 1b LOC Questions: 0 1c LOC Commands: 0 2 Best Gaze: 0 3 Visual: 0 4 Facial Palsy: 0 5a Motor Arm - left: 0 5b Motor Arm - Right: 0 6a Motor Leg - Left: 0 6b Motor Leg - Right: 0 7 Limb  Ataxia: 0 8 Sensory: 0 9 Best Language: 0 10 Dysarthria: 0 11 Extinct. and Inatten.: 0 TOTAL: 0   I have reviewed labs in epic and the results pertinent to this consultation are: K 2.6, replacing  Cr 1.30  I have reviewed the images obtained: Code stroke CT H -No acute intracranial process. ASPECTS is 10   CTA head/neck 1. 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 2. Moderate stenosis of the distal right V4 segment. 3. Progressive attenuation of the basilar artery with high-grade proximal  stenosis. 4. Improved flow in the PCA vessels bilaterally with significant contributions from the posterior communicating arteries bilaterally. 5. 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 6. Stable atherosclerotic changes of the carotid bifurcations bilaterally without significant stenosis. 7. No significant stenosis in the anterior circulation. 8. Stable advanced degenerative changes of the cervical spine. 9. Aortic Atherosclerosis   Primary Diagnosis:  Acute posterior ischemic stroke   Secondary Diagnosis: Essential (primary) hypertension  Recommendations: -Admit to Neuro ICU  - HgbA1c, fasting lipid panel - MRI of the brain without contrast  - Frequent neuro checks - Echocardiogram - CT head 24 hr s/p TNK. - Hold all antiplatelets. If no stroke on brain imaging 24hr post TNK start ASA  - Clevipres gtt to maintain BP goal 180/105 post TNK - Labetalol and hydralazine PRN to maintain BP goal  - Bedside Swallow eval. If passes start a diet  - Check UA, UDS and ethanol level  - Risk factor modification - Telemetry monitoring - PT consult, OT consult, Speech consult - Stroke team to follow   Beulah Gandy DNP, ACNPC-AG   I have seen the note and was present for the entirety of the evaluation and management reflected in the above note.  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  Even so, I am not certain if this is a chronic or acute finding, but in any case, given his relatively mild symptoms would not recommend any type of intervention at this time.  If he were to abruptly worsen, would consider formal angiogram to delineate this.  He does have significant truncal ataxia, and is able to walk which is a disabling deficit, and therefore I did recommend IV thrombolytics.  After discussion of the risks and benefits, the patient agreed with proceeding.  He will need to be closely monitored in the  intensive care unit for postthrombolytic care.  This patient is critically ill and at significant risk of neurological worsening, death and care requires constant monitoring of vital signs, hemodynamics,respiratory and cardiac monitoring, neurological assessment, discussion with family, other specialists and medical decision making of high complexity. I spent 45 minutes of neurocritical care time  in the care of  this patient. This was time spent independent of any time provided by nurse practitioner or PA.  Roland Rack, MD [facility] [phone]  If 7pm- 7am, please page neurology on call as listed in [place]. 01/23/2022  6:39 PM

## 2022-01-23 NOTE — ED Provider Notes (Signed)
[facility] EMERGENCY DEPARTMENT Provider Note   CSN: [id number] Arrival date & time: 01/23/22  1645  An emergency department physician performed an initial assessment on this suspected stroke patient at 1647.  History  Chief Complaint  Patient presents with   Code Stroke    Elijah [name] is a 87 y.o. male.  HPI 87 year old male presents as a code stroke.  Developed acute dizziness while sitting at 3:45 PM.  He was nauseous and had blurry vision but no double vision or vomiting.  No focal weakness.  Called as a code stroke in the field and met by neurology at the door.   Home Medications Prior to Admission medications   Medication Sig Start Date End Date Taking? Authorizing Provider  albuterol (VENTOLIN HFA) 108 (90 Base) MCG/ACT inhaler Inhale 1-2 puffs into the lungs every 4 (four) hours as needed for wheezing or shortness of breath.    [provider]  amLODipine (NORVASC) 10 MG tablet Take 1 tablet (10 mg total) by mouth daily. 09/02/21   Libby Maw, MD  aspirin EC 81 MG tablet Take 1 tablet (81 mg total) by mouth daily. 02/08/20   Frann Rider, NP  atorvastatin (LIPITOR) 40 MG tablet Take 1 tablet (40 mg total) by mouth daily at 6 PM. 09/02/21   Libby Maw, MD  cholecalciferol (VITAMIN D3) 25 MCG (1000 UNIT) tablet Take 2,000 Units by mouth every other day.     [provider]  fluticasone (FLONASE) 50 MCG/ACT nasal spray USE 2 SPRAYS IN EACH NOSTRIL DURING ALLERGY SEASON. 06/25/21   Libby Maw, MD  losartan (COZAAR) 50 MG tablet Take 1 tablet (50 mg total) by mouth daily. 12/01/21   Libby Maw, MD      Allergies    Penicillins    Review of Systems   Review of Systems  Unable to perform ROS: Acuity of condition    Physical Exam Updated Vital Signs BP 118/64   Pulse 61   Temp 98.2 F (36.8 C) (Oral)   Resp (!) 21   Ht 5' 7"  (1.702 m)   Wt 69.6 kg   SpO2 97%   BMI 24.03 kg/m   Physical Exam Vitals and nursing note reviewed.  Constitutional:      Appearance: He is well-developed.  HENT:     Head: Normocephalic and atraumatic.  Eyes:     Extraocular Movements: Extraocular movements intact.     Pupils: Pupils are equal, round, and reactive to light.     Comments: No obvious nystagmus  Cardiovascular:     Rate and Rhythm: Normal rate and regular rhythm.     Heart sounds: Normal heart sounds.  Pulmonary:     Effort: Pulmonary effort is normal.     Breath sounds: Normal breath sounds.  Abdominal:     General: There is no distension.     Palpations: Abdomen is soft.     Tenderness: There is no abdominal tenderness.  Skin:    General: Skin is warm and dry.  Neurological:     Mental Status: He is alert.     Comments: CN 3-12 grossly intact. 5/5 strength in all 4 extremities. Grossly normal sensation. Normal finger to nose.      ED Results / Procedures / Treatments   Labs (all labs ordered are listed, but only abnormal results are displayed) Labs Reviewed  MRSA NEXT GEN BY PCR, NASAL - Abnormal; Notable for the following components:  Result Value   MRSA by PCR Next Gen DETECTED (*)    All other components within normal limits  APTT - Abnormal; Notable for the following components:   aPTT <20 (*)    All other components within normal limits  CBC - Abnormal; Notable for the following components:   MCHC 36.4 (*)    All other components within normal limits  COMPREHENSIVE METABOLIC PANEL - Abnormal; Notable for the following components:   Potassium 2.6 (*)    Chloride 112 (*)    CO2 19 (*)    Glucose, Bld 136 (*)    Creatinine, Ser 1.41 (*)    Calcium 8.4 (*)    Total Protein 6.1 (*)    Total Bilirubin 1.4 (*)    GFR, Estimated 49 (*)    All other components within normal limits  MAGNESIUM - Abnormal; Notable for the following components:   Magnesium 1.4 (*)    All other components within normal limits  PHOSPHORUS - Abnormal; Notable for the  following components:   Phosphorus 1.9 (*)    All other components within normal limits  GLUCOSE, CAPILLARY - Abnormal; Notable for the following components:   Glucose-Capillary 124 (*)    All other components within normal limits  CBG MONITORING, ED - Abnormal; Notable for the following components:   Glucose-Capillary 133 (*)    All other components within normal limits  I-STAT CHEM 8, ED - Abnormal; Notable for the following components:   Potassium 2.6 (*)    Creatinine, Ser 1.30 (*)    Glucose, Bld 133 (*)    Calcium, Ion 1.01 (*)    TCO2 20 (*)    All other components within normal limits  RESP PANEL BY RT-PCR (FLU A&B, COVID) ARPGX2  ETHANOL  PROTIME-INR  DIFFERENTIAL  RAPID URINE DRUG SCREEN, HOSP PERFORMED  URINALYSIS, ROUTINE W REFLEX MICROSCOPIC  HEMOGLOBIN A1C  LIPID PANEL  CBC  COMPREHENSIVE METABOLIC PANEL  RAPID URINE DRUG SCREEN, HOSP PERFORMED  URINALYSIS, COMPLETE (UACMP) WITH MICROSCOPIC    EKG None  Radiology CT ANGIO HEAD NECK W WO CM (CODE STROKE)  Result Date: 01/23/2022 CLINICAL DATA:  Dizziness.  Syncope. EXAM: CT ANGIOGRAPHY HEAD AND NECK TECHNIQUE: Multidetector CT imaging of the head and neck was performed using the standard protocol during bolus administration of intravenous contrast. Multiplanar CT image reconstructions and MIPs were obtained to evaluate the vascular anatomy. Carotid stenosis measurements (when applicable) are obtained utilizing NASCET criteria, using the distal internal carotid diameter as the denominator. RADIATION DOSE REDUCTION: This exam was performed according to the departmental dose-optimization program which includes automated exposure control, adjustment of the mA and/or kV according to patient size and/or use of iterative reconstruction technique. CONTRAST:  14m OMNIPAQUE IOHEXOL 350 MG/ML SOLN COMPARISON:  CTA head and neck 09/03/2019 FINDINGS: CTA NECK FINDINGS Aortic arch: Atherosclerotic changes are present in the distal  arch. 3 vessel arch configuration is present. No significant stenosis or aneurysm is present. Calcifications are present at the origin of the right subclavian artery with less than 50% stenosis, similar the prior exam. Right carotid system: Right common carotid artery demonstrates some mural calcification without significant stenosis. Calcifications are present the proximal right ICA, similar the prior study. No significant stenosis is present. Cervical right ICA is otherwise normal. Left carotid system: Left common carotid artery is within normal limits. Atherosclerotic changes are present in proximal left ICA without significant stenosis. Cervical left ICA is otherwise normal. Vertebral arteries: The vertebral arteries are occluded proximally  bilaterally. Vertebral arteries are reconstituted in the V2 segments bilaterally. Right vertebral artery is dominant. Skeleton: Multilevel degenerative changes of the cervical spine are stable. No focal osseous lesions are present. Other neck: Soft tissues the neck are otherwise unremarkable. Salivary glands are within normal limits. Thyroid is normal. No significant adenopathy is present. No focal mucosal or submucosal lesions are present. Upper chest: Lung apices are clear. Thoracic inlet is within normal limits. Review of the MIP images confirms the above findings CTA HEAD FINDINGS Anterior circulation: Atherosclerotic calcifications are present within the cavernous internal carotid arteries bilaterally without a significant stenosis through the ICA termini. The A1 and M1 segments are normal. Anterior communicating artery is patent. MCA bifurcations are within normal limits bilaterally. The ACA and MCA branch vessels are within normal limits. Posterior circulation: The right vertebral artery is dominant. PICA origins are visualized and within normal limits. High-grade stenosis or occlusion is present in the distal left V4 segment. This has progressed since the prior exam.  Moderate stenosis is present in the distal right V4 segment. Marked attenuation of the basilar artery has progressed with high-grade proximal stenosis. Prominent posterior communicating arteries are present bilaterally. P1 segment is larger on the right than left, similar the prior exam. Flow in the PCA vessels has improved bilaterally since the prior exam. Venous sinuses: The dural sinuses are patent. The straight sinus deep cerebral veins are intact. Cortical veins are within normal limits. No significant vascular malformation is evident. Anatomic variants: Fetal type left posterior cerebral artery. Review of the MIP images confirms the above findings IMPRESSION: 1. 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 2. Moderate stenosis of the distal right V4 segment. 3. Progressive attenuation of the basilar artery with high-grade proximal stenosis. 4. Improved flow in the PCA vessels bilaterally with significant contributions from the posterior communicating arteries bilaterally. 5. 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 6. Stable atherosclerotic changes of the carotid bifurcations bilaterally without significant stenosis. 7. No significant stenosis in the anterior circulation. 8. Stable advanced degenerative changes of the cervical spine. 9. Aortic Atherosclerosis (ICD10-I70.0). Electronically Signed   By: San Morelle M.D.   On: 01/23/2022 17:22   CT HEAD CODE STROKE WO CONTRAST  Result Date: 01/23/2022 CLINICAL DATA:  Code stroke.  Dizziness, syncope EXAM: CT HEAD WITHOUT CONTRAST TECHNIQUE: Contiguous axial images were obtained from the base of the skull through the vertex without intravenous contrast. RADIATION DOSE REDUCTION: This exam was performed according to the departmental dose-optimization program which includes automated exposure control, adjustment of the mA and/or kV according to patient size and/or use of iterative reconstruction  technique. COMPARISON:  09/03/2019 FINDINGS: Brain: No evidence of acute infarction, hemorrhage, cerebral edema, mass, mass effect, or midline shift. No hydrocephalus or extra-axial collection. Remote bilateral cerebellar infarcts. Vascular: No hyperdense vessel. Skull: Negative for fracture or focal lesion. Sinuses/Orbits: No acute finding. Status post right lens replacement. Other: The mastoid air cells are well aerated. ASPECTS [facility] Stroke Program Early CT Score) - Ganglionic level infarction (caudate, lentiform nuclei, internal capsule, insula, M1-M3 cortex): 7 - Supraganglionic infarction (M4-M6 cortex): 3 Total score (0-10 with 10 being normal): 10 IMPRESSION: 1. No acute intracranial process. 2. ASPECTS is 10 Code stroke imaging results were communicated on 01/23/2022 at 5:03 pm to provider Dr. Leonel Ramsay via secure text paging. Electronically Signed   By: Merilyn Baba M.D.   On: 01/23/2022 17:04    Procedures .Critical Care  Performed by: Sherwood Gambler, MD Authorized  by: Sherwood Gambler, MD   Critical care provider statement:    Critical care time (minutes):  30   Critical care time was exclusive of:  Separately billable procedures and treating other patients   Critical care was necessary to treat or prevent imminent or life-threatening deterioration of the following conditions:  CNS failure or compromise   Critical care was time spent personally by me on the following activities:  Development of treatment plan with patient or surrogate, discussions with consultants, evaluation of patient's response to treatment, examination of patient, ordering and review of laboratory studies, ordering and review of radiographic studies, ordering and performing treatments and interventions, pulse oximetry, re-evaluation of patient's condition and review of old charts     Medications Ordered in ED Medications   stroke: early stages of recovery book (has no administration in time range)  0.9 %   sodium chloride infusion ( Intravenous Not Given 01/23/22 1732)  acetaminophen (TYLENOL) tablet 650 mg (has no administration in time range)    Or  acetaminophen (TYLENOL) 160 MG/5ML solution 650 mg (has no administration in time range)    Or  acetaminophen (TYLENOL) suppository 650 mg (has no administration in time range)  senna-docusate (Senokot-S) tablet 1 tablet (has no administration in time range)  pantoprazole (PROTONIX) injection 40 mg (40 mg Intravenous Given 01/23/22 2108)  clevidipine (CLEVIPREX) infusion 0.5 mg/mL (has no administration in time range)  atorvastatin (LIPITOR) tablet 40 mg (40 mg Oral Given 01/23/22 2109)  iohexol (OMNIPAQUE) 350 MG/ML injection 75 mL (75 mLs Intravenous Contrast Given 01/23/22 1705)  tenecteplase (TNKASE) injection for Stroke 17 mg (17 mg Intravenous Given 01/23/22 1710)  potassium chloride 10 mEq in 100 mL IVPB (10 mEq Intravenous New Bag/Given 01/23/22 2106)  potassium chloride SA (KLOR-CON M) CR tablet 40 mEq (40 mEq Oral Given 01/23/22 2109)  lactated ringers bolus 500 mL ( Intravenous Stopped 01/23/22 1820)    ED Course/ Medical Decision Making/ A&P                           Medical Decision Making Amount and/or Complexity of Data Reviewed Labs: ordered.  Risk Prescription drug management. Decision regarding hospitalization.   Patient was treated by neurology with TNK after negative head CT.  Appears to have basilar occlusion on CTA.  At this point, he is also found to have significant hypokalemia though I do not think this would cause such severe vertigo like symptoms that he could not walk.  He will need the potassium repleted.  He was given IV fluids.  TNK given by neurology and he will be admitted to the neuro ICU.        Final Clinical Impression(s) / ED Diagnoses Final diagnoses:  Acute ischemic stroke ([place])  Hypokalemia    Rx / DC Orders ED Discharge Orders     None         Sherwood Gambler, MD 01/23/22 2339

## 2022-01-23 NOTE — ED Triage Notes (Signed)
Pt BIB EMS activated as a code stroke. Per EMS patient was outside, got dizzy and had a syncopal episode. Pt had intermittent slurred speech during transport. Pt denies any injury. CBG 130.

## 2022-01-23 NOTE — Code Documentation (Signed)
Stroke Response Nurse Documentation Code Documentation  Elijah [name] is a 87 y.o. male arriving to [facility]  via [facility] on 01/23/22 with past medical hx of CVA, HTN, HLD. On No antithrombotic. Code stroke was activated by EMS.   Patient from home where he was LKW at 29 when he suddenly because dizzy, light headed, and "felt like he might pass out." Family reported that this is how he presented the last time he had a stroke which is why they called EMS.  Stroke team at the bedside on patient arrival. Labs drawn and patient cleared for CT by Dr Regenia Skeeter. Patient to CT with team. NIHSS 0, see documentation for details and code stroke times. Patient only presenting with persistent dizziness, and light headedness. After completing CT we attempted to stand the patient but were only ablt to sit edge of bed and repeat a blood pressure as the patient was unable to stand due to dizziness. The following imaging was completed:  CT Head and CTA. Patient is a candidate for IV Thrombolytic and received at 1710. Patient is not a candidate for IR at this time per physician.   Care Plan: NIHSS/VS q15 x2hrs, q30 x6hrs, q1 x16hrs.   Bedside handoff with ED RN Camryn.    Elijah [name]  Stroke Response RN

## 2022-01-24 ENCOUNTER — Inpatient Hospital Stay (HOSPITAL_COMMUNITY): Payer: Medicare Other

## 2022-01-24 DIAGNOSIS — I6389 Other cerebral infarction: Secondary | ICD-10-CM | POA: Diagnosis not present

## 2022-01-24 DIAGNOSIS — I639 Cerebral infarction, unspecified: Secondary | ICD-10-CM | POA: Diagnosis not present

## 2022-01-24 LAB — ECHOCARDIOGRAM COMPLETE
Area-P 1/2: 3.08 cm2
Calc EF: 45.3 %
Height: 67 in
S' Lateral: 3.7 cm
Single Plane A2C EF: 45.1 %
Single Plane A4C EF: 45.8 %
Weight: 2455.04 oz

## 2022-01-24 LAB — CBC
HCT: 36.6 % — ABNORMAL LOW (ref 39.0–52.0)
Hemoglobin: 13 g/dL (ref 13.0–17.0)
MCH: 30.6 pg (ref 26.0–34.0)
MCHC: 35.5 g/dL (ref 30.0–36.0)
MCV: 86.1 fL (ref 80.0–100.0)
Platelets: 180 10*3/uL (ref 150–400)
RBC: 4.25 MIL/uL (ref 4.22–5.81)
RDW: 12.7 % (ref 11.5–15.5)
WBC: 7.6 10*3/uL (ref 4.0–10.5)
nRBC: 0 % (ref 0.0–0.2)

## 2022-01-24 LAB — COMPREHENSIVE METABOLIC PANEL
ALT: 13 U/L (ref 0–44)
AST: 16 U/L (ref 15–41)
Albumin: 3.1 g/dL — ABNORMAL LOW (ref 3.5–5.0)
Alkaline Phosphatase: 49 U/L (ref 38–126)
Anion gap: 5 (ref 5–15)
BUN: 14 mg/dL (ref 8–23)
CO2: 24 mmol/L (ref 22–32)
Calcium: 8.3 mg/dL — ABNORMAL LOW (ref 8.9–10.3)
Chloride: 111 mmol/L (ref 98–111)
Creatinine, Ser: 1.23 mg/dL (ref 0.61–1.24)
GFR, Estimated: 58 mL/min — ABNORMAL LOW (ref >60)
Glucose, Bld: 142 mg/dL — ABNORMAL HIGH (ref 70–99)
Potassium: 3.8 mmol/L (ref 3.5–5.1)
Sodium: 140 mmol/L (ref 135–145)
Total Bilirubin: 1.1 mg/dL (ref 0.3–1.2)
Total Protein: 5.6 g/dL — ABNORMAL LOW (ref 6.5–8.1)

## 2022-01-24 LAB — LIPID PANEL
Cholesterol: 108 mg/dL (ref 0–200)
HDL: 47 mg/dL (ref >40)
LDL Cholesterol: 53 mg/dL (ref 0–99)
Total CHOL/HDL Ratio: 2.3 RATIO
Triglycerides: 38 mg/dL (ref <150)
VLDL: 8 mg/dL (ref 0–40)

## 2022-01-24 MED ORDER — CHLORHEXIDINE GLUCONATE CLOTH 2 % EX PADS
6.0000 | MEDICATED_PAD | Freq: Every day | CUTANEOUS | Status: DC
  Administered 2022-01-24 – 2022-01-26 (×2): 6 via TOPICAL

## 2022-01-24 MED ORDER — MUPIROCIN 2 % EX OINT
1.0000 | TOPICAL_OINTMENT | Freq: Two times a day (BID) | CUTANEOUS | Status: DC
  Administered 2022-01-24 – 2022-01-25 (×3): 1 via NASAL
  Filled 2022-01-24 (×2): qty 22

## 2022-01-24 MED ORDER — MAGNESIUM SULFATE 4 GM/100ML IV SOLN
4.0000 g | Freq: Once | INTRAVENOUS | Status: AC
  Administered 2022-01-24: 4 g via INTRAVENOUS
  Filled 2022-01-24: qty 100

## 2022-01-24 NOTE — Progress Notes (Addendum)
STROKE TEAM PROGRESS NOTE   INTERVAL HISTORY His wife and son are at the bedside.  Plan for cerebral angiogram Monday with Dr. Bronson Curb.  MRI pending Replace magnesium today, K 3.8 Stable for transfer out of ICU  Vitals:   01/24/22 0400 01/24/22 0500 01/24/22 0600 01/24/22 0700  BP: 122/63 (!) 144/70 118/77 123/65  Pulse: 67 66 69 63  Resp: '10 16 20 19  '$ Temp: 98.3 F (36.8 C)   98 F (36.7 C)  TempSrc: Oral   Oral  SpO2: 96% 97% 99% 98%  Weight:      Height:       CBC:  Recent Labs  Lab 01/23/22 1658 01/24/22 0041  WBC 8.9 7.6  NEUTROABS 4.8  --   HGB 14.5 13.0  HCT 39.8 36.6*  MCV 85.0 86.1  PLT 236 081   Basic Metabolic Panel:  Recent Labs  Lab 01/23/22 1658 01/24/22 0041  NA 141 140  K 2.6* 3.8  CL 112* 111  CO2 19* 24  GLUCOSE 136* 142*  BUN 21 14  CREATININE 1.41* 1.23  CALCIUM 8.4* 8.3*  MG 1.4*  --   PHOS 1.9*  --    Lipid Panel:  Recent Labs  Lab 01/24/22 0041  CHOL 108  TRIG 38  HDL 47  CHOLHDL 2.3  VLDL 8  LDLCALC 53   HgbA1c:  Recent Labs  Lab 01/23/22 1658  HGBA1C 5.4   Urine Drug Screen:  Recent Labs  Lab 01/23/22 1900  LABOPIA NONE DETECTED  COCAINSCRNUR NONE DETECTED  LABBENZ NONE DETECTED  AMPHETMU NONE DETECTED  THCU NONE DETECTED  LABBARB NONE DETECTED    Alcohol Level  Recent Labs  Lab 01/23/22 1658  ETH <10    IMAGING past 24 hours CT ANGIO HEAD NECK W WO CM (CODE STROKE)  Result Date: 01/23/2022 CLINICAL DATA:  Dizziness.  Syncope. EXAM: CT ANGIOGRAPHY HEAD AND NECK TECHNIQUE: Multidetector CT imaging of the head and neck was performed using the standard protocol during bolus administration of intravenous contrast. Multiplanar CT image reconstructions and MIPs were obtained to evaluate the vascular anatomy. Carotid stenosis measurements (when applicable) are obtained utilizing NASCET criteria, using the distal internal carotid diameter as the denominator. RADIATION DOSE REDUCTION: This exam was performed  according to the departmental dose-optimization program which includes automated exposure control, adjustment of the mA and/or kV according to patient size and/or use of iterative reconstruction technique. CONTRAST:  92m OMNIPAQUE IOHEXOL 350 MG/ML SOLN COMPARISON:  CTA head and neck 09/03/2019 FINDINGS: CTA NECK FINDINGS Aortic arch: Atherosclerotic changes are present in the distal arch. 3 vessel arch configuration is present. No significant stenosis or aneurysm is present. Calcifications are present at the origin of the right subclavian artery with less than 50% stenosis, similar the prior exam. Right carotid system: Right common carotid artery demonstrates some mural calcification without significant stenosis. Calcifications are present the proximal right ICA, similar the prior study. No significant stenosis is present. Cervical right ICA is otherwise normal. Left carotid system: Left common carotid artery is within normal limits. Atherosclerotic changes are present in proximal left ICA without significant stenosis. Cervical left ICA is otherwise normal. Vertebral arteries: The vertebral arteries are occluded proximally bilaterally. Vertebral arteries are reconstituted in the V2 segments bilaterally. Right vertebral artery is dominant. Skeleton: Multilevel degenerative changes of the cervical spine are stable. No focal osseous lesions are present. Other neck: Soft tissues the neck are otherwise unremarkable. Salivary glands are within normal limits. Thyroid is normal. No significant  adenopathy is present. No focal mucosal or submucosal lesions are present. Upper chest: Lung apices are clear. Thoracic inlet is within normal limits. Review of the MIP images confirms the above findings CTA HEAD FINDINGS Anterior circulation: Atherosclerotic calcifications are present within the cavernous internal carotid arteries bilaterally without a significant stenosis through the ICA termini. The A1 and M1 segments are normal.  Anterior communicating artery is patent. MCA bifurcations are within normal limits bilaterally. The ACA and MCA branch vessels are within normal limits. Posterior circulation: The right vertebral artery is dominant. PICA origins are visualized and within normal limits. High-grade stenosis or occlusion is present in the distal left V4 segment. This has progressed since the prior exam. Moderate stenosis is present in the distal right V4 segment. Marked attenuation of the basilar artery has progressed with high-grade proximal stenosis. Prominent posterior communicating arteries are present bilaterally. P1 segment is larger on the right than left, similar the prior exam. Flow in the PCA vessels has improved bilaterally since the prior exam. Venous sinuses: The dural sinuses are patent. The straight sinus deep cerebral veins are intact. Cortical veins are within normal limits. No significant vascular malformation is evident. Anatomic variants: Fetal type left posterior cerebral artery. Review of the MIP images confirms the above findings IMPRESSION: 1. 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 2. Moderate stenosis of the distal right V4 segment. 3. Progressive attenuation of the basilar artery with high-grade proximal stenosis. 4. Improved flow in the PCA vessels bilaterally with significant contributions from the posterior communicating arteries bilaterally. 5. 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 6. Stable atherosclerotic changes of the carotid bifurcations bilaterally without significant stenosis. 7. No significant stenosis in the anterior circulation. 8. Stable advanced degenerative changes of the cervical spine. 9. Aortic Atherosclerosis (ICD10-I70.0). Electronically Signed   By: San Morelle M.D.   On: 01/23/2022 17:22   CT HEAD CODE STROKE WO CONTRAST  Result Date: 01/23/2022 CLINICAL DATA:  Code stroke.  Dizziness, syncope EXAM: CT HEAD  WITHOUT CONTRAST TECHNIQUE: Contiguous axial images were obtained from the base of the skull through the vertex without intravenous contrast. RADIATION DOSE REDUCTION: This exam was performed according to the departmental dose-optimization program which includes automated exposure control, adjustment of the mA and/or kV according to patient size and/or use of iterative reconstruction technique. COMPARISON:  09/03/2019 FINDINGS: Brain: No evidence of acute infarction, hemorrhage, cerebral edema, mass, mass effect, or midline shift. No hydrocephalus or extra-axial collection. Remote bilateral cerebellar infarcts. Vascular: No hyperdense vessel. Skull: Negative for fracture or focal lesion. Sinuses/Orbits: No acute finding. Status post right lens replacement. Other: The mastoid air cells are well aerated. ASPECTS [facility] Stroke Program Early CT Score) - Ganglionic level infarction (caudate, lentiform nuclei, internal capsule, insula, M1-M3 cortex): 7 - Supraganglionic infarction (M4-M6 cortex): 3 Total score (0-10 with 10 being normal): 10 IMPRESSION: 1. No acute intracranial process. 2. ASPECTS is 10 Code stroke imaging results were communicated on 01/23/2022 at 5:03 pm to provider Dr. Leonel Ramsay via secure text paging. Electronically Signed   By: Merilyn Baba M.D.   On: 01/23/2022 17:04    PHYSICAL EXAM Constitutional: Appears well-developed and well-nourished.  Cardiovascular: Normal rate and regular rhythm.  Respiratory: Effort normal and breath sounds normal to anterior ascultation   Neuro: Mental Status: Patient is awake, alert, oriented to person, place, month, year, and situation. Patient is able to give a clear and coherent history. No signs of aphasia or neglect Cranial Nerves: II:  Visual Fields are full, nystagmus Pupils are equal, round, and reactive to light.   III,IV, VI: EOMI without ptosis or diploplia.  V: Facial sensation is symmetric to temperature VII: Facial movement is symmetric.   VIII: hearing is intact to voice X: Uvula elevates symmetrically XI: Shoulder shrug is symmetric. XII: tongue is midline without atrophy or fasciculations.  Motor: Tone is normal. Bulk is normal. 5/5 strength was present in all four extremities.  Sensory: Sensation is symmetric to light touch and temperature in the arms and legs. Cerebellar: FNF and HKS are intact bilaterally Gait deferred for patient's safety  ASSESSMENT/PLAN [name] is a 87 y.o. male with history of HTN, HLD, stroke, asthma and vitamin d deficiency who presents to [facility] ED via EMS for acute onset of dizziness and syncopal episode.  Plan for cerebral angiogram with Dr. Estanislado Pandy on Monday.  MRI is pending, once 24 hours post TNK will initiate DAPT therapy with Brilinta and aspirin given the high-grade stenosis in the vertebral and basilar arteries.  Stroke: Cerebellar stroke Etiology:   secondary to high grade stenosis in the vertebral/basilar system  Code Stroke CT head No acute intracranial abnormality. ASPECTS 10.    CTA head & neck Progressive high-grade stenosis or occlusion of the distal left V4 segment. Moderate stenosis of the distal right V4 segment. Progressive attenuation of the basilar artery with high-grade proximal stenosis. MRI -pending 2D Echo- 50- 55% LDL 53 HgbA1c 5.4 VTE prophylaxis - SCDs    Diet   Diet Heart Room service appropriate? Yes; Fluid consistency: Thin   aspirin 81 mg daily prior to admission, now on No antithrombotic. Recommend brilinta and asa as DAPT therapy once 24 hours post TNK  Therapy recommendations: Pending Disposition: Pending  Hypertension Home meds:  Losartan, norvasc Stable Permissive hypertension (OK if < 220/120) but gradually normalize in 5-7 days Long-term BP goal normotensive  Hyperlipidemia Home meds:  Atorvastatin '40mg'$ , resumed in hospital LDL 53, goal < 70  Continue statin at discharge  Other Stroke Risk Factors Advanced Age >/= 41   Other  Active Problems Hypokalemia, hypomagnesemia Replete, Daily BMP  Hospital day # 1  Patient seen and examined by NP/APP with MD. MD to update note as needed.   Janine Ores, DNP, FNP-BC [facility] Pager: [phone]  ATTENDING ATTESTATION:  87 year old with vertigo and ataxia status post TNK.  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  Discussed with IR and plan for angiogram on Monday.  Recommend aspirin and Brilinta.  Transfer out of the ICU today.  Exam is largely unremarkable and he is essentially returned to baseline.  MRI still pending.  Dr. Reeves Forth evaluated pt independently, reviewed imaging, chart, labs. Discussed and formulated plan with the APP. Please see APP note above for details.      This patient is critically ill due to stroke s/p tPA and at significant risk of neurological worsening, death form heart failure, respiratory failure, recurrent stroke, bleeding from [facility], seizure, sepsis. This patient's care requires constant monitoring of vital signs, hemodynamics, respiratory and cardiac monitoring, review of multiple databases, neurological assessment, discussion with family, other specialists and medical decision making of high complexity. I spent 40 minutes of neurocritical care time in the care of this patient.   Ivanka Kirshner,MD   To contact Stroke Continuity provider, please refer to [email or web address] After hours, contact General Neurology

## 2022-01-24 NOTE — Progress Notes (Signed)
  Echocardiogram 2D Echocardiogram has been performed.  Elijah [name] 01/24/2022, 1:48 PM

## 2022-01-24 NOTE — Progress Notes (Signed)
PT Cancellation Note  Patient Details Name: Basel Defalco MRN: [id number] DOB: 1934/08/29   Cancelled Treatment:    Reason Eval/Treat Not Completed: Active bedrest order. TNK administered 7/28 1710.   Lorriane Shire 01/24/2022, 7:16 AM

## 2022-01-24 NOTE — Progress Notes (Signed)
OT Cancellation Note  Patient Details Name: Dom Haverland MRN: [id number] DOB: 1935-06-16   Cancelled Treatment:    Reason Eval/Treat Not Completed: Active bedrest order. TNKase administered 01/23/22 1710/ OT will follow up as pt is medically able to participate.   Arnitra Sokoloski Elane Rainelle Sulewski 01/24/2022, 12:46 PM

## 2022-01-24 NOTE — Progress Notes (Signed)
SLP Cancellation Note  Patient Details Name: Parsa Rickett MRN: [id number] DOB: April 02, 1935   Cancelled treatment:       Reason Eval/Treat Not Completed: SLP screened, no needs identified, will sign off   Sonia Baller, MA, CCC-SLP Speech Therapy

## 2022-01-25 DIAGNOSIS — I639 Cerebral infarction, unspecified: Secondary | ICD-10-CM | POA: Diagnosis not present

## 2022-01-25 LAB — CBC WITH DIFFERENTIAL/PLATELET
Abs Immature Granulocytes: 0.06 10*3/uL (ref 0.00–0.07)
Basophils Absolute: 0.1 10*3/uL (ref 0.0–0.1)
Basophils Relative: 1 %
Eosinophils Absolute: 0.2 10*3/uL (ref 0.0–0.5)
Eosinophils Relative: 2 %
HCT: 40.2 % (ref 39.0–52.0)
Hemoglobin: 14.7 g/dL (ref 13.0–17.0)
Immature Granulocytes: 1 %
Lymphocytes Relative: 14 %
Lymphs Abs: 1.3 10*3/uL (ref 0.7–4.0)
MCH: 30.9 pg (ref 26.0–34.0)
MCHC: 36.6 g/dL — ABNORMAL HIGH (ref 30.0–36.0)
MCV: 84.6 fL (ref 80.0–100.0)
Monocytes Absolute: 0.9 10*3/uL (ref 0.1–1.0)
Monocytes Relative: 10 %
Neutro Abs: 6.6 10*3/uL (ref 1.7–7.7)
Neutrophils Relative %: 72 %
Platelets: 205 10*3/uL (ref 150–400)
RBC: 4.75 MIL/uL (ref 4.22–5.81)
RDW: 12.5 % (ref 11.5–15.5)
WBC: 9.1 10*3/uL (ref 4.0–10.5)
nRBC: 0 % (ref 0.0–0.2)

## 2022-01-25 LAB — BASIC METABOLIC PANEL
Anion gap: 5 (ref 5–15)
BUN: 11 mg/dL (ref 8–23)
CO2: 25 mmol/L (ref 22–32)
Calcium: 8.9 mg/dL (ref 8.9–10.3)
Chloride: 108 mmol/L (ref 98–111)
Creatinine, Ser: 0.91 mg/dL (ref 0.61–1.24)
GFR, Estimated: >60 mL/min (ref >60)
Glucose, Bld: 113 mg/dL — ABNORMAL HIGH (ref 70–99)
Potassium: 3.6 mmol/L (ref 3.5–5.1)
Sodium: 138 mmol/L (ref 135–145)

## 2022-01-25 LAB — PHOSPHORUS: Phosphorus: 2.4 mg/dL — ABNORMAL LOW (ref 2.5–4.6)

## 2022-01-25 LAB — MAGNESIUM: Magnesium: 1.6 mg/dL — ABNORMAL LOW (ref 1.7–2.4)

## 2022-01-25 MED ORDER — ASPIRIN 81 MG PO TBEC
81.0000 mg | DELAYED_RELEASE_TABLET | Freq: Every day | ORAL | Status: DC
  Administered 2022-01-25: 81 mg via ORAL
  Filled 2022-01-25: qty 1

## 2022-01-25 MED ORDER — PANTOPRAZOLE SODIUM 40 MG PO TBEC
40.0000 mg | DELAYED_RELEASE_TABLET | Freq: Every day | ORAL | Status: DC
  Administered 2022-01-25: 40 mg via ORAL
  Filled 2022-01-25: qty 1

## 2022-01-25 MED ORDER — MAGNESIUM SULFATE 4 GM/100ML IV SOLN
4.0000 g | Freq: Once | INTRAVENOUS | Status: AC
  Administered 2022-01-25: 4 g via INTRAVENOUS
  Filled 2022-01-25: qty 100

## 2022-01-25 MED ORDER — TICAGRELOR 90 MG PO TABS
90.0000 mg | ORAL_TABLET | Freq: Two times a day (BID) | ORAL | Status: DC
Start: 2022-01-25 — End: 2022-01-26
  Administered 2022-01-25 (×2): 90 mg via ORAL
  Filled 2022-01-25 (×3): qty 1

## 2022-01-25 NOTE — Progress Notes (Signed)
Patient level of care changed to Telemetry. Transferred from 76M to 3W, arrived to 8I51 via wheelchair. Oriented to unit, placed on cardiac monitor, educated patient and family on plan of care.

## 2022-01-25 NOTE — Progress Notes (Signed)
STROKE TEAM PROGRESS NOTE   INTERVAL HISTORY His wife and son are at the bedside.   Plan for cerebral angiogram Monday with Dr. Bronson Curb.  MRI pending Replace magnesium today, K 3.8 Stable for transfer out of ICU  Vitals:   01/25/22 0200 01/25/22 0300 01/25/22 0400 01/25/22 0700  BP: 138/78  131/70   Pulse:      Resp:   19   Temp:  97.6 F (36.4 C)  (!) 97.5 F (36.4 C)  TempSrc:  Oral  Axillary  SpO2:   95%   Weight:      Height:       CBC:  Recent Labs  Lab 01/23/22 1658 01/24/22 0041  WBC 8.9 7.6  NEUTROABS 4.8  --   HGB 14.5 13.0  HCT 39.8 36.6*  MCV 85.0 86.1  PLT 236 696    Basic Metabolic Panel:  Recent Labs  Lab 01/23/22 1658 01/24/22 0041  NA 141 140  K 2.6* 3.8  CL 112* 111  CO2 19* 24  GLUCOSE 136* 142*  BUN 21 14  CREATININE 1.41* 1.23  CALCIUM 8.4* 8.3*  MG 1.4*  --   PHOS 1.9*  --     Lipid Panel:  Recent Labs  Lab 01/24/22 0041  CHOL 108  TRIG 38  HDL 47  CHOLHDL 2.3  VLDL 8  LDLCALC 53    HgbA1c:  Recent Labs  Lab 01/23/22 1658  HGBA1C 5.4    Urine Drug Screen:  Recent Labs  Lab 01/23/22 1900  LABOPIA NONE DETECTED  COCAINSCRNUR NONE DETECTED  LABBENZ NONE DETECTED  AMPHETMU NONE DETECTED  THCU NONE DETECTED  LABBARB NONE DETECTED     Alcohol Level  Recent Labs  Lab 01/23/22 1658  ETH <10     IMAGING past 24 hours MR BRAIN WO CONTRAST  Result Date: 01/24/2022 CLINICAL DATA:  Stroke, follow-up status post TNK. EXAM: MRI HEAD WITHOUT CONTRAST TECHNIQUE: Multiplanar, multiecho pulse sequences of the brain and surrounding structures were obtained without intravenous contrast. COMPARISON:  CT head and CTA head neck 01/23/2022. MR head without contrast 09/03/2019. FINDINGS: Brain: No acute infarct, hemorrhage, or mass lesion is present. Remote infarcts of the cerebellum again noted bilaterally. Mild periventricular and subcortical T2 hyperintensities otherwise are likely within normal limits. Insert proportionate  scratched at mild atrophy is present. The ventricles are proportionate to the degree of atrophy. No significant extraaxial fluid collection is present. Vascular: Abnormal signal present in the left vertebral artery and at vertebrobasilar junction. Flow void is present in the distal basilar artery. Flow is present in the internal carotid arteries and branch vessels bilaterally. Prominent right posterior communicating artery is noted. Skull and upper cervical spine: The craniocervical junction is normal. Upper cervical spine is within normal limits. Marrow signal is unremarkable. Sinuses/Orbits: The paranasal sinuses and mastoid air cells are clear. Right lens replacement is present. Globes and orbits are otherwise within normal limits. IMPRESSION: 1. No acute intracranial abnormality or significant interval change. 2. Remote infarcts of the cerebellum bilaterally. 3. Abnormal signal in the left vertebral artery and at the vertebrobasilar junction suggesting slow flow or chronic occlusion. 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 Electronically Signed   By: San Morelle M.D.   On: 01/24/2022 19:43   ECHOCARDIOGRAM COMPLETE  Result Date: 01/24/2022    ECHOCARDIOGRAM REPORT   Patient Name:   SHINE MIKES Date of Exam: 01/24/2022 Medical Rec #:  [id number]      Height:  67.0 in Accession #:    [id number]     Weight:       153.4 lb Date of Birth:  Jul 08, 1934       BSA:          1.807 m Patient Age:    87 years       BP:           116/67 mmHg Patient Gender: M              HR:           67 bpm. Exam Location:  Inpatient Procedure: 2D Echo Indications:    stroke  History:        Patient has prior history of Echocardiogram examinations, most                 recent 09/04/2019. Risk Factors:Hypertension and Dyslipidemia.  Sonographer:    Johny Chess RDCS Referring Phys: [phone] DENISE A WOLFE  Sonographer Comments: Suboptimal subcostal window. Image acquisition  challenging due to respiratory motion. IMPRESSIONS  1. Left ventricular ejection fraction, by estimation, is 50 to 55%. The left ventricle has low normal function. The left ventricle has no regional wall motion abnormalities. Left ventricular diastolic parameters were normal.  2. Right ventricular systolic function is normal. The right ventricular size is normal. There is normal pulmonary artery systolic pressure. The estimated right ventricular systolic pressure is 97.6 mmHg.  3. Left atrial size was mildly dilated.  4. The mitral valve is normal in structure. Mild mitral valve regurgitation.  5. The aortic valve is tricuspid. There is mild calcification of the aortic valve. Aortic valve regurgitation is trivial. Aortic valve sclerosis is present, with no evidence of aortic valve stenosis.  6. There is mild dilatation of the ascending aorta, measuring 39 mm. FINDINGS  Left Ventricle: Left ventricular ejection fraction, by estimation, is 50 to 55%. The left ventricle has low normal function. The left ventricle has no regional wall motion abnormalities. The left ventricular internal cavity size was normal in size. There is no left ventricular hypertrophy. Left ventricular diastolic parameters were normal. Normal left ventricular filling pressure. Right Ventricle: The right ventricular size is normal. No increase in right ventricular wall thickness. Right ventricular systolic function is normal. There is normal pulmonary artery systolic pressure. The tricuspid regurgitant velocity is 2.28 m/s, and  with an assumed right atrial pressure of 3 mmHg, the estimated right ventricular systolic pressure is 73.4 mmHg. Left Atrium: Left atrial size was mildly dilated. Right Atrium: Right atrial size was normal in size. Pericardium: There is no evidence of pericardial effusion. Mitral Valve: The mitral valve is normal in structure. Mild mitral annular calcification. Mild mitral valve regurgitation, with centrally-directed jet.  Tricuspid Valve: The tricuspid valve is normal in structure. Tricuspid valve regurgitation is trivial. Aortic Valve: The aortic valve is tricuspid. There is mild calcification of the aortic valve. Aortic valve regurgitation is trivial. Aortic valve sclerosis is present, with no evidence of aortic valve stenosis. Pulmonic Valve: The pulmonic valve was grossly normal. Pulmonic valve regurgitation is not visualized. Aorta: The aortic root is normal in size and structure. There is mild dilatation of the ascending aorta, measuring 39 mm. IAS/Shunts: No atrial level shunt detected by color flow Doppler.  LEFT VENTRICLE PLAX 2D LVIDd:         5.20 cm     Diastology LVIDs:         3.70 cm     LV e' medial:  8.27 cm/s LV PW:         0.80 cm     LV E/e' medial:  10.2 LV IVS:        0.70 cm     LV e' lateral:   11.10 cm/s                            LV E/e' lateral: 7.6  LV Volumes (MOD) LV vol d, MOD A2C: 99.8 ml LV vol d, MOD A4C: 92.6 ml LV vol s, MOD A2C: 54.8 ml LV vol s, MOD A4C: 50.2 ml LV SV MOD A2C:     45.0 ml LV SV MOD A4C:     92.6 ml LV SV MOD BP:      43.8 ml RIGHT VENTRICLE             IVC RV S prime:     11.70 cm/s  IVC diam: 1.50 cm TAPSE (M-mode): 2.4 cm LEFT ATRIUM           Index        RIGHT ATRIUM           Index LA diam:      3.70 cm 2.05 cm/m   RA Area:     12.70 cm LA Vol (A4C): 48.2 ml 26.68 ml/m  RA Volume:   25.80 ml  14.28 ml/m  AORTIC VALVE LVOT Vmax:   122.00 cm/s LVOT Vmean:  79.100 cm/s LVOT VTI:    0.244 m  AORTA Ao Root diam: 3.60 cm Ao Asc diam:  3.90 cm MITRAL VALVE               TRICUSPID VALVE MV Area (PHT): 3.08 cm    TR Peak grad:   20.8 mmHg MV Decel Time: 246 msec    TR Vmax:        228.00 cm/s MV E velocity: 84.60 cm/s MV A velocity: 75.50 cm/s  SHUNTS MV E/A ratio:  1.12        Systemic VTI: 0.24 m Dani Gobble Croitoru MD Electronically signed by Sanda Klein MD Signature Date/Time: 01/24/2022/2:03:15 PM    Final     PHYSICAL EXAM Constitutional: Appears well-developed and  well-nourished.  Cardiovascular: Normal rate and regular rhythm.  Respiratory: Effort normal and breath sounds normal to anterior ascultation   Neuro: Mental Status: Patient is awake, alert, oriented to person, place, month, year, and situation. Patient is able to give a clear and coherent history. No signs of aphasia or neglect Cranial Nerves: II: Visual Fields are full, nystagmus Pupils are equal, round, and reactive to light.   III,IV, VI: EOMI without ptosis or diploplia.  V: Facial sensation is symmetric to temperature VII: Facial movement is symmetric.  VIII: hearing is intact to voice X: Uvula elevates symmetrically XI: Shoulder shrug is symmetric. XII: tongue is midline without atrophy or fasciculations.  Motor: Tone is normal. Bulk is normal. 5/5 strength was present in all four extremities.  Sensory: Sensation is symmetric to light touch and temperature in the arms and legs. Cerebellar: FNF and HKS are intact bilaterally Gait deferred for patient's safety  ASSESSMENT/PLAN Mr. Arath Kaigler is a 87 y.o. male with history of HTN, HLD, stroke, asthma and vitamin d deficiency who presents to [facility] ED via EMS for acute onset of dizziness and syncopal episode.  Plan for cerebral angiogram with Dr. Estanislado Pandy on Monday.  MRI is pending, once 24 hours post TNK will initiate DAPT  therapy with Brilinta and aspirin given the high-grade stenosis in the vertebral and basilar arteries.  Stroke: Cerebellar stroke Etiology:   secondary to high grade stenosis in the vertebral/basilar system  Code Stroke CT head No acute intracranial abnormality. ASPECTS 10.    CTA head & neck Progressive high-grade stenosis or occlusion of the distal left V4 segment. Moderate stenosis of the distal right V4 segment. Progressive attenuation of the basilar artery with high-grade proximal stenosis. MRI -Remote infarcts of the cerebellum bilaterally, Abnormal signal in the left vertebral artery and at the  vertebrobasilar junction suggesting slow flow or chronic occlusion. 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 2D Echo- 50- 55% LDL 53 HgbA1c 5.4 VTE prophylaxis - SCDs    Diet   Diet Heart Room service appropriate? Yes; Fluid consistency: Thin   aspirin 81 mg daily prior to admission, now on No antithrombotic. Recommend brilinta and asa as DAPT therapy once 24 hours post TNK  Therapy recommendations: Pending Disposition: Pending  Hypertension Home meds:  Losartan, norvasc Stable Permissive hypertension (OK if < 220/120) but gradually normalize in 5-7 days Long-term BP goal normotensive  Hyperlipidemia Home meds:  Atorvastatin '40mg'$ , resumed in hospital LDL 53, goal < 70  Continue statin at discharge  Other Stroke Risk Factors Advanced Age >/= 24   Other Active Problems Hypokalemia, hypomagnesemia Replete, Daily BMP  Hospital day # 55  87 year old with vertigo and ataxia status post TNK.  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  Discussed with IR and plan for angiogram on Monday.  aspirin and Brilinta.  Exam is largely unremarkable and he is essentially returned to baseline. Labs done but not resulted. Will follow.NPO after midnight.  This patient is critically ill due to stroke s/p tPA and at significant risk of neurological worsening, death form heart failure, respiratory failure, recurrent stroke, bleeding from [facility], seizure, sepsis. This patient's care requires constant monitoring of vital signs, hemodynamics, respiratory and cardiac monitoring, review of multiple databases, neurological assessment, discussion with family, other specialists and medical decision making of high complexity. I spent 35 minutes of neurocritical care time in the care of this patient.    Mandisa Persinger,MD   To contact Stroke Continuity provider, please refer to [email or web address] After hours, contact General Neurology

## 2022-01-25 NOTE — Progress Notes (Signed)
PT Cancellation Note  Patient Details Name: Zymire Turnbo MRN: [id number] DOB: 12/31/1934   Cancelled Treatment:    Reason Eval/Treat Not Completed: PT screened, no needs identified, will sign off. Pt is independent.    Lorriane Shire 01/25/2022, 10:17 AM

## 2022-01-25 NOTE — Evaluation (Signed)
Occupational Therapy Evaluation Patient Details Name: Elijah [name] MRN: [id number] DOB: 23-Oct-1934 Today's Date: 01/25/2022   History of Present Illness 87 y.o. M admitted on 01/23/22 due to dizziness and a syncopal episode. 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 PMH significant for HTN, HLD, stroke, asthma and vitamin d deficiency.   Clinical Impression   Pt admitted for concerns listed above. PTA pt reported that he was independent with all ADL's and IADL's, including exercising and walking a mile a day. At this time, pt presents near his baseline. He continues to be independent with all ADL's and functional mobility. No balance concerns noted. Vision and cognition are [facility]. Pt is very concerned with his prognosis, however motivated to continue mobilizing. He has no further skilled OT needs and acute OT will sign off.       Recommendations for follow up therapy are one component of a multi-disciplinary discharge planning process, led by the attending physician.  Recommendations may be updated based on patient status, additional functional criteria and insurance authorization.   Follow Up Recommendations  No OT follow up    Assistance Recommended at Discharge None  Patient can return home with the following      Functional Status Assessment  Patient has had a recent decline in their functional status and demonstrates the ability to make significant improvements in function in a reasonable and predictable amount of time.  Equipment Recommendations  None recommended by OT    Recommendations for Other Services       Precautions / Restrictions Precautions Precautions: Fall Restrictions Weight Bearing Restrictions: No      Mobility Bed Mobility Overal bed mobility: Independent                  Transfers Overall transfer level: Independent Equipment used: None                       Balance Overall balance assessment: Independent                                         ADL either performed or assessed with clinical judgement   ADL Overall ADL's : Independent;At baseline                                             Vision Baseline Vision/History: 1 Wears glasses Ability to See in Adequate Light: 0 Adequate Patient Visual Report: No change from baseline Vision Assessment?: No apparent visual deficits     Perception     Praxis      Pertinent Vitals/Pain Pain Assessment Pain Assessment: No/denies pain     Hand Dominance Right   Extremity/Trunk Assessment Upper Extremity Assessment Upper Extremity Assessment: Overall WFL for tasks assessed   Lower Extremity Assessment Lower Extremity Assessment: Overall WFL for tasks assessed   Cervical / Trunk Assessment Cervical / Trunk Assessment: Normal   Communication Communication Communication: No difficulties   Cognition Arousal/Alertness: Awake/alert Behavior During Therapy: WFL for tasks assessed/performed Overall Cognitive Status: Within Functional Limits for tasks assessed  General Comments  VSS on RA    Exercises     Shoulder Instructions      Home Living Family/patient expects to be discharged to:: Private residence Living Arrangements: Spouse/significant other Available Help at Discharge: Family;Available 24 hours/day Type of Home: House Home Access: Level entry     Home Layout: Two level;Able to live on main level with bedroom/bathroom Alternate Level Stairs-Number of Steps: full flighy Alternate Level Stairs-Rails: Right Bathroom Shower/Tub: Occupational psychologist: Standard     Home Equipment: Shower seat - built in;Grab bars - tub/shower;Grab bars - toilet;Rolling Environmental consultant (2 wheels);Cane - single point          Prior Functioning/Environment Prior Level of  Function : Independent/Modified Independent;Driving                        OT Problem List: Decreased activity tolerance      OT Treatment/Interventions:      OT Goals(Current goals can be found in the care plan section) Acute Rehab OT Goals Patient Stated Goal: To get home to his wife OT Goal Formulation: With patient Time For Goal Achievement: 01/25/22 Potential to Achieve Goals: Good  OT Frequency:      Co-evaluation              AM-PAC OT "6 Clicks" Daily Activity     Outcome Measure Help from another person eating meals?: None Help from another person taking care of personal grooming?: None Help from another person toileting, which includes using toliet, bedpan, or urinal?: None Help from another person bathing (including washing, rinsing, drying)?: None Help from another person to put on and taking off regular upper body clothing?: None Help from another person to put on and taking off regular lower body clothing?: None 6 Click Score: 24   End of Session Nurse Communication: Mobility status  Activity Tolerance: Patient tolerated treatment well Patient left: in bed;with call bell/phone within reach  OT Visit Diagnosis: Unsteadiness on feet (R26.81);Other abnormalities of gait and mobility (R26.89);Muscle weakness (generalized) (M62.81)                Time: 4967-5916 OT Time Calculation (min): 28 min Charges:  OT General Charges $OT Visit: 1 Visit OT Evaluation $OT Eval Moderate Complexity: 1 Mod OT Treatments $Self Care/Home Management : 8-22 mins  Leonidas Boateng H., OTR/L Acute Rehabilitation  Lenell Mcconnell Elane Dailee Manalang 01/25/2022, 11:39 AM

## 2022-01-25 NOTE — Progress Notes (Signed)
Pharmacy Electrolyte Replacement  Recent Labs:  Recent Labs    01/25/22 1126  K 3.6  MG 1.6*  PHOS 2.4*  CREATININE 0.91    Low Critical Values (K </= 2.5, Phos </= 1, Mg </= 1) Present: None  MD Contacted: n/a  Plan: Magnesium 4g IV x1 today F/up in AM Phos rising with po intake - will hold off on replacement.   01/25/2022, 1:43 PM

## 2022-01-25 NOTE — Consult Note (Signed)
Chief Complaint: Patient was seen in consultation today for basilar artery stenosis.  Referring Physician(s): Egbert Garibaldi, MD  Supervising Physician: Luanne Bras  Patient Status: [facility] - In-pt  History of Present Illness: Elijah [name] is a 87 y.o. male with a past medical history significant for asthma, HLD, HTN, CVA who presented to [facility] ED on 01/23/22 with complaints of acute onset dizziness, nausea and blurry vision . At the time of presentation to the ED his symptoms had completely resolved. Code stroke was activated and CT head showed no acute intracranial process. Patient also underwent CTA head/neck which showed:  1. 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 2. Moderate stenosis of the distal right V4 segment. 3. Progressive attenuation of the basilar artery with high-grade proximal stenosis. 4. Improved flow in the PCA vessels bilaterally with significant contributions from the posterior communicating arteries bilaterally. 5. 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 6. Stable atherosclerotic changes of the carotid bifurcations bilaterally without significant stenosis. 7. No significant stenosis in the anterior circulation. 8. Stable advanced degenerative changes of the cervical spine. 9. Aortic Atherosclerosis (ICD10-I70.0).  He was given TNK and admitted for further evaluation by the neurology team. He underwent MRI of the brain which showed:  1. No acute intracranial abnormality or significant interval change. 2. Remote infarcts of the cerebellum bilaterally. 3. Abnormal signal in the left vertebral artery and at the vertebrobasilar junction suggesting slow flow or chronic occlusion. 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  NIR has been consulted for cerebral angiogram with possible intervention.  Patient seen at bedside this morning with Dr. Estanislado Pandy and  Dr. Gerhard Perches. Patient confirms above history and states that when he had a stroke 2.5 years ago he does not remember experiencing similar symptoms. He was started on ASA and atorvastatin which he takes religiously. He also has had BP issues for most of his life and takes 2 medications everyday for this. He has never used tobacco or drank alcohol excessively. He is generally very healthy. He previously worked in the [employer] but is now retired and lives at home with his wife who is also in good health except for some vision loss. His son and their family live across the street.   Procedure indications, risks, benefits and alternatives were discussed with the patient by Dr. Estanislado Pandy and Dr. Gerhard Perches - he is agreeable to proceed. I presented to the room later this afternoon to review the procedure with patient's wife and son per his request, all questions answered to their satisfaction and they are also agreeable to proceed.   Past Medical History:  Diagnosis Date   Allergy    Arthritis    hands   Asthma    Elevated PSA    s/p neg biopsy   Enlarged prostate    Hyperlipidemia    Hypertension    Stroke [facility])    Wears hearing aid     Past Surgical History:  Procedure Laterality Date   CATARACT EXTRACTION W/PHACO Right 05/08/2015   Procedure: CATARACT EXTRACTION PHACO AND INTRAOCULAR LENS PLACEMENT ([place]);  Surgeon: Leandrew Koyanagi, MD;  Location: [place];  Service: Ophthalmology;  Laterality: Right;   PROSTATE BIOPSY     TONSILLECTOMY      Allergies: Penicillins  Medications: Prior to Admission medications   Medication Sig Start Date End Date Taking? Authorizing Provider  albuterol (VENTOLIN HFA) 108 (90 Base) MCG/ACT inhaler Inhale 1-2 puffs into the lungs  every 4 (four) hours as needed for wheezing or shortness of breath.   Yes [provider]  amLODipine (NORVASC) 10 MG tablet Take 1 tablet (10 mg total) by mouth daily. 09/02/21  Yes Libby Maw,  MD  aspirin EC 81 MG tablet Take 1 tablet (81 mg total) by mouth daily. 02/08/20  Yes McCue, Janett Billow, NP  atorvastatin (LIPITOR) 40 MG tablet Take 1 tablet (40 mg total) by mouth daily at 6 PM. Patient taking differently: Take 40 mg by mouth daily. 09/02/21  Yes Libby Maw, MD  cholecalciferol (VITAMIN D3) 25 MCG (1000 UNIT) tablet Take 2,000 Units by mouth daily.   Yes [provider]  fluticasone (FLONASE) 50 MCG/ACT nasal spray USE 2 SPRAYS IN EACH NOSTRIL DURING ALLERGY SEASON. Patient taking differently: Place 1 spray into both nostrils daily. 06/25/21  Yes Libby Maw, MD  losartan (COZAAR) 50 MG tablet Take 1 tablet (50 mg total) by mouth daily. 12/01/21  Yes Libby Maw, MD     Family History  Problem Relation Age of Onset   Heart disease Father    Stroke Mother    Cancer Sister     Social History   Socioeconomic History   Marital status: Married    Spouse name: Not on file   Number of children: 1   Years of education: Not on file   Highest education level: Not on file  Occupational History   Occupation: Retired Technical brewer: retired  Tobacco Use   Smoking status: Never   Smokeless tobacco: Never  Substance and Sexual Activity   Alcohol use: Yes    Alcohol/week: 14.0 standard drinks of alcohol    Types: 14 Shots of liquor per week   Drug use: No   Sexual activity: Yes  Other Topics Concern   Not on file  Social History Narrative   One biological son, one adopted son, two granddaughters, 20 & 18 yo      Has living will and HPOA- wife Elijah [name].   Would desire CPR.  Would not want prolonged life support.   Social Determinants of Health   Financial Resource Strain: Low Risk  (02/04/2021)   Overall Financial Resource Strain (CARDIA)    Difficulty of Paying Living Expenses: Not very hard  Food Insecurity: No Food Insecurity (02/04/2021)   Hunger Vital Sign    Worried About Running Out of Food in the Last Year:  Never true    Ran Out of Food in the Last Year: Never true  Transportation Needs: No Transportation Needs (01/24/2020)   PRAPARE - Hydrologist (Medical): No    Lack of Transportation (Non-Medical): No  Physical Activity: Sufficiently Active (02/04/2021)   Exercise Vital Sign    Days of Exercise per Week: 5 days    Minutes of Exercise per Session: 40 min  Stress: No Stress Concern Present (02/04/2021)   [place]    Feeling of Stress : Not at all  Social Connections: Moderately Integrated (02/04/2021)   Social Connection and Isolation Panel [NHANES]    Frequency of Communication with Friends and Family: More than three times a week    Frequency of Social Gatherings with Friends and Family: More than three times a week    Attends Religious Services: More than 4 times per year    Active Member of [employer] or Organizations: No    Attends Archivist Meetings:  Never    Marital Status: Married     Review of Systems: A 12 point ROS discussed and pertinent positives are indicated in the HPI above.  All other systems are negative.  Review of Systems  Constitutional:  Negative for chills and fever.  Respiratory:  Negative for cough and shortness of breath.   Cardiovascular:  Negative for chest pain.  Gastrointestinal:  Negative for abdominal pain, diarrhea, nausea and vomiting.  Neurological:  Negative for dizziness, syncope, speech difficulty, weakness, numbness and headaches.    Vital Signs: BP 119/68   Pulse 65   Temp (!) 97.5 F (36.4 C) (Axillary)   Resp (!) 23   Ht '5\' 7"'$  (1.702 m)   Wt 153 lb 7 oz (69.6 kg)   SpO2 95%   BMI 24.03 kg/m   Physical Exam Vitals and nursing note reviewed.  Constitutional:      General: He is not in acute distress. HENT:     Head: Normocephalic.     Mouth/Throat:     Mouth: Mucous membranes are moist.     Pharynx: Oropharynx is clear. No  oropharyngeal exudate or posterior oropharyngeal erythema.  Cardiovascular:     Rate and Rhythm: Regular rhythm. Tachycardia present.  Pulmonary:     Effort: Pulmonary effort is normal.     Breath sounds: Normal breath sounds.  Abdominal:     Palpations: Abdomen is soft.     Tenderness: There is no abdominal tenderness.  Skin:    General: Skin is warm and dry.  Neurological:     Mental Status: He is alert.   Alert, awake, and oriented x 4 Speech and comprehension in tact (patient HOH w/o hearing aids on exam) PERRL bilaterally EOMs without nystagmus or subjective diplopia. Visual fields grossly in tact No facial asymmetry. Tongue midline Motor power - 5/5 in all 4 extremities Fine motor and coordination in tact Gait not assessed   MD Evaluation Airway: WNL Heart: WNL Abdomen: WNL Chest/ Lungs: WNL ASA  Classification: 3 Mallampati/Airway Score: Two   Imaging: MR BRAIN WO CONTRAST  Result Date: 01/24/2022 CLINICAL DATA:  Stroke, follow-up status post TNK. EXAM: MRI HEAD WITHOUT CONTRAST TECHNIQUE: Multiplanar, multiecho pulse sequences of the brain and surrounding structures were obtained without intravenous contrast. COMPARISON:  CT head and CTA head neck 01/23/2022. MR head without contrast 09/03/2019. FINDINGS: Brain: No acute infarct, hemorrhage, or mass lesion is present. Remote infarcts of the cerebellum again noted bilaterally. Mild periventricular and subcortical T2 hyperintensities otherwise are likely within normal limits. Insert proportionate scratched at mild atrophy is present. The ventricles are proportionate to the degree of atrophy. No significant extraaxial fluid collection is present. Vascular: Abnormal signal present in the left vertebral artery and at vertebrobasilar junction. Flow void is present in the distal basilar artery. Flow is present in the internal carotid arteries and branch vessels bilaterally. Prominent right posterior communicating artery is  noted. Skull and upper cervical spine: The craniocervical junction is normal. Upper cervical spine is within normal limits. Marrow signal is unremarkable. Sinuses/Orbits: The paranasal sinuses and mastoid air cells are clear. Right lens replacement is present. Globes and orbits are otherwise within normal limits. IMPRESSION: 1. No acute intracranial abnormality or significant interval change. 2. Remote infarcts of the cerebellum bilaterally. 3. Abnormal signal in the left vertebral artery and at the vertebrobasilar junction suggesting slow flow or chronic occlusion. 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 Electronically Signed   By: Wynetta Fines.D.  On: 01/24/2022 19:43   ECHOCARDIOGRAM COMPLETE  Result Date: 01/24/2022    ECHOCARDIOGRAM REPORT   Patient Name:   Elijah [name] Date of Exam: 01/24/2022 Medical Rec #:  [id number]      Height:       67.0 in Accession #:    [id number]     Weight:       153.4 lb Date of Birth:  1935/02/04       BSA:          1.807 m Patient Age:    29 years       BP:           116/67 mmHg Patient Gender: M              HR:           67 bpm. Exam Location:  Inpatient Procedure: 2D Echo Indications:    stroke  History:        Patient has prior history of Echocardiogram examinations, most                 recent 09/04/2019. Risk Factors:Hypertension and Dyslipidemia.  Sonographer:    Johny Chess RDCS Referring Phys: [phone] DENISE A WOLFE  Sonographer Comments: Suboptimal subcostal window. Image acquisition challenging due to respiratory motion. IMPRESSIONS  1. Left ventricular ejection fraction, by estimation, is 50 to 55%. The left ventricle has low normal function. The left ventricle has no regional wall motion abnormalities. Left ventricular diastolic parameters were normal.  2. Right ventricular systolic function is normal. The right ventricular size is normal. There is normal pulmonary artery systolic pressure. The estimated right  ventricular systolic pressure is 98.3 mmHg.  3. Left atrial size was mildly dilated.  4. The mitral valve is normal in structure. Mild mitral valve regurgitation.  5. The aortic valve is tricuspid. There is mild calcification of the aortic valve. Aortic valve regurgitation is trivial. Aortic valve sclerosis is present, with no evidence of aortic valve stenosis.  6. There is mild dilatation of the ascending aorta, measuring 39 mm. FINDINGS  Left Ventricle: Left ventricular ejection fraction, by estimation, is 50 to 55%. The left ventricle has low normal function. The left ventricle has no regional wall motion abnormalities. The left ventricular internal cavity size was normal in size. There is no left ventricular hypertrophy. Left ventricular diastolic parameters were normal. Normal left ventricular filling pressure. Right Ventricle: The right ventricular size is normal. No increase in right ventricular wall thickness. Right ventricular systolic function is normal. There is normal pulmonary artery systolic pressure. The tricuspid regurgitant velocity is 2.28 m/s, and  with an assumed right atrial pressure of 3 mmHg, the estimated right ventricular systolic pressure is 38.2 mmHg. Left Atrium: Left atrial size was mildly dilated. Right Atrium: Right atrial size was normal in size. Pericardium: There is no evidence of pericardial effusion. Mitral Valve: The mitral valve is normal in structure. Mild mitral annular calcification. Mild mitral valve regurgitation, with centrally-directed jet. Tricuspid Valve: The tricuspid valve is normal in structure. Tricuspid valve regurgitation is trivial. Aortic Valve: The aortic valve is tricuspid. There is mild calcification of the aortic valve. Aortic valve regurgitation is trivial. Aortic valve sclerosis is present, with no evidence of aortic valve stenosis. Pulmonic Valve: The pulmonic valve was grossly normal. Pulmonic valve regurgitation is not visualized. Aorta: The aortic root  is normal in size and structure. There is mild dilatation of the ascending aorta, measuring 39 mm. IAS/Shunts: No atrial level shunt detected by  color flow Doppler.  LEFT VENTRICLE PLAX 2D LVIDd:         5.20 cm     Diastology LVIDs:         3.70 cm     LV e' medial:    8.27 cm/s LV PW:         0.80 cm     LV E/e' medial:  10.2 LV IVS:        0.70 cm     LV e' lateral:   11.10 cm/s                            LV E/e' lateral: 7.6  LV Volumes (MOD) LV vol d, MOD A2C: 99.8 ml LV vol d, MOD A4C: 92.6 ml LV vol s, MOD A2C: 54.8 ml LV vol s, MOD A4C: 50.2 ml LV SV MOD A2C:     45.0 ml LV SV MOD A4C:     92.6 ml LV SV MOD BP:      43.8 ml RIGHT VENTRICLE             IVC RV S prime:     11.70 cm/s  IVC diam: 1.50 cm TAPSE (M-mode): 2.4 cm LEFT ATRIUM           Index        RIGHT ATRIUM           Index LA diam:      3.70 cm 2.05 cm/m   RA Area:     12.70 cm LA Vol (A4C): 48.2 ml 26.68 ml/m  RA Volume:   25.80 ml  14.28 ml/m  AORTIC VALVE LVOT Vmax:   122.00 cm/s LVOT Vmean:  79.100 cm/s LVOT VTI:    0.244 m  AORTA Ao Root diam: 3.60 cm Ao Asc diam:  3.90 cm MITRAL VALVE               TRICUSPID VALVE MV Area (PHT): 3.08 cm    TR Peak grad:   20.8 mmHg MV Decel Time: 246 msec    TR Vmax:        228.00 cm/s MV E velocity: 84.60 cm/s MV A velocity: 75.50 cm/s  SHUNTS MV E/A ratio:  1.12        Systemic VTI: 0.24 m Dani Gobble Croitoru MD Electronically signed by Sanda Klein MD Signature Date/Time: 01/24/2022/2:03:15 PM    Final    CT ANGIO HEAD NECK W WO CM (CODE STROKE)  Result Date: 01/23/2022 CLINICAL DATA:  Dizziness.  Syncope. EXAM: CT ANGIOGRAPHY HEAD AND NECK TECHNIQUE: Multidetector CT imaging of the head and neck was performed using the standard protocol during bolus administration of intravenous contrast. Multiplanar CT image reconstructions and MIPs were obtained to evaluate the vascular anatomy. Carotid stenosis measurements (when applicable) are obtained utilizing NASCET criteria, using the distal internal  carotid diameter as the denominator. RADIATION DOSE REDUCTION: This exam was performed according to the departmental dose-optimization program which includes automated exposure control, adjustment of the mA and/or kV according to patient size and/or use of iterative reconstruction technique. CONTRAST:  52m OMNIPAQUE IOHEXOL 350 MG/ML SOLN COMPARISON:  CTA head and neck 09/03/2019 FINDINGS: CTA NECK FINDINGS Aortic arch: Atherosclerotic changes are present in the distal arch. 3 vessel arch configuration is present. No significant stenosis or aneurysm is present. Calcifications are present at the origin of the right subclavian artery with less than 50% stenosis, similar the prior exam. Right carotid system:  Right common carotid artery demonstrates some mural calcification without significant stenosis. Calcifications are present the proximal right ICA, similar the prior study. No significant stenosis is present. Cervical right ICA is otherwise normal. Left carotid system: Left common carotid artery is within normal limits. Atherosclerotic changes are present in proximal left ICA without significant stenosis. Cervical left ICA is otherwise normal. Vertebral arteries: The vertebral arteries are occluded proximally bilaterally. Vertebral arteries are reconstituted in the V2 segments bilaterally. Right vertebral artery is dominant. Skeleton: Multilevel degenerative changes of the cervical spine are stable. No focal osseous lesions are present. Other neck: Soft tissues the neck are otherwise unremarkable. Salivary glands are within normal limits. Thyroid is normal. No significant adenopathy is present. No focal mucosal or submucosal lesions are present. Upper chest: Lung apices are clear. Thoracic inlet is within normal limits. Review of the MIP images confirms the above findings CTA HEAD FINDINGS Anterior circulation: Atherosclerotic calcifications are present within the cavernous internal carotid arteries bilaterally  without a significant stenosis through the ICA termini. The A1 and M1 segments are normal. Anterior communicating artery is patent. MCA bifurcations are within normal limits bilaterally. The ACA and MCA branch vessels are within normal limits. Posterior circulation: The right vertebral artery is dominant. PICA origins are visualized and within normal limits. High-grade stenosis or occlusion is present in the distal left V4 segment. This has progressed since the prior exam. Moderate stenosis is present in the distal right V4 segment. Marked attenuation of the basilar artery has progressed with high-grade proximal stenosis. Prominent posterior communicating arteries are present bilaterally. P1 segment is larger on the right than left, similar the prior exam. Flow in the PCA vessels has improved bilaterally since the prior exam. Venous sinuses: The dural sinuses are patent. The straight sinus deep cerebral veins are intact. Cortical veins are within normal limits. No significant vascular malformation is evident. Anatomic variants: Fetal type left posterior cerebral artery. Review of the MIP images confirms the above findings IMPRESSION: 1. 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 2. Moderate stenosis of the distal right V4 segment. 3. Progressive attenuation of the basilar artery with high-grade proximal stenosis. 4. Improved flow in the PCA vessels bilaterally with significant contributions from the posterior communicating arteries bilaterally. 5. 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 6. Stable atherosclerotic changes of the carotid bifurcations bilaterally without significant stenosis. 7. No significant stenosis in the anterior circulation. 8. Stable advanced degenerative changes of the cervical spine. 9. Aortic Atherosclerosis (ICD10-I70.0). Electronically Signed   By: San Morelle M.D.   On: 01/23/2022 17:22   CT HEAD CODE STROKE WO  CONTRAST  Result Date: 01/23/2022 CLINICAL DATA:  Code stroke.  Dizziness, syncope EXAM: CT HEAD WITHOUT CONTRAST TECHNIQUE: Contiguous axial images were obtained from the base of the skull through the vertex without intravenous contrast. RADIATION DOSE REDUCTION: This exam was performed according to the departmental dose-optimization program which includes automated exposure control, adjustment of the mA and/or kV according to patient size and/or use of iterative reconstruction technique. COMPARISON:  09/03/2019 FINDINGS: Brain: No evidence of acute infarction, hemorrhage, cerebral edema, mass, mass effect, or midline shift. No hydrocephalus or extra-axial collection. Remote bilateral cerebellar infarcts. Vascular: No hyperdense vessel. Skull: Negative for fracture or focal lesion. Sinuses/Orbits: No acute finding. Status post right lens replacement. Other: The mastoid air cells are well aerated. ASPECTS [facility] Stroke Program Early CT Score) - Ganglionic level infarction (caudate, lentiform nuclei, internal capsule, insula, M1-M3 cortex): 7 -  Supraganglionic infarction (M4-M6 cortex): 3 Total score (0-10 with 10 being normal): 10 IMPRESSION: 1. No acute intracranial process. 2. ASPECTS is 10 Code stroke imaging results were communicated on 01/23/2022 at 5:03 pm to provider Dr. Leonel Ramsay via secure text paging. Electronically Signed   By: Merilyn Baba M.D.   On: 01/23/2022 17:04    Labs:  CBC: Recent Labs    04/21/21 1627 07/29/21 0906 01/23/22 1654 01/23/22 1658 01/24/22 0041  WBC 8.6 5.8  --  8.9 7.6  HGB 15.3 16.3 13.3 14.5 13.0  HCT 45.2 48.0 39.0 39.8 36.6*  PLT 211.0 220.0  --  236 180    COAGS: Recent Labs    01/23/22 1658  INR 1.1  APTT <20*    BMP: Recent Labs    04/21/21 1627 07/29/21 0906 01/23/22 1654 01/23/22 1658 01/24/22 0041  NA 142 137 142 141 140  K 4.1 3.7 2.6* 2.6* 3.8  CL 104 102 110 112* 111  CO2 26 28  --  19* 24  GLUCOSE 142* 102* 133* 136* 142*   BUN '20 10 23 21 14  '$ CALCIUM 9.7 9.4  --  8.4* 8.3*  CREATININE 1.29 0.96 1.30* 1.41* 1.23  GFRNONAA  --   --   --  49* 58*    LIVER FUNCTION TESTS: Recent Labs    01/28/21 0850 07/29/21 0906 01/23/22 1658 01/24/22 0041  BILITOT 1.5* 1.6* 1.4* 1.1  AST '15 19 19 16  '$ ALT '12 16 13 13  '$ ALKPHOS 63 68 56 49  PROT 7.0 7.0 6.1* 5.6*  ALBUMIN 4.1 4.2 3.6 3.1*    TUMOR MARKERS: No results for input(s): "AFPTM", "CEA", "CA199", "CHROMGRNA" in the last 8760 hours.  Assessment and Plan:  87 y/o M admitted 7/28 as a code stroke due to acute onset dizziness, nausea and blurry vision. No acute process on CT head, however CTA showed high grade stenosis of basilar artery as well as high grade stenosis of the left vertebral artery and moderate stenosis of the right vertebral artery. He was given TNK and admitted for further evaluation by neurology. NIR has been consulted for cerebral angiogram with possible intervention.  Patient seen with Dr. Estanislado Pandy and Dr. Gerhard Perches today - reviewed the procedure in detail, including the very small risk of stroke or death, with the patient and his family today. All questions answered and they are in agreement to proceed.  Tentatively planned for cerebral angiogram with moderate sedation potentially followed by angioplasty/stent placement within the basilar artery stenosis under general anesthesia depending on the results of the cerebral angiogram. Patient is aware of this plan and agrees.  Plan: - NPO at midnight 7/31 - CBC/INR/BMP AM of 7/31 - Start ASA 81 mg QD + Brilinta 90 mg BID today (ordered) - Hold anticoagulation until post procedure (not currently receiving per [facility]) - Vancomycin 1g signed and held, to be given in IR during procedure (patient with PCN allergy listed as hives) - Patient will require neuro ICU post procedure for frequent neuro checks/arterial puncture checks. He is currently in medical ICU.   Risks and benefits of cerebral arteriogram  with possible intervention were discussed with the patient including, but not limited to bleeding, infection, vascular injury, contrast induced renal failure, stroke, reperfusion hemorrhage, or even death. This interventional procedure involves the use of X-rays and because of the nature of the planned procedure, it is possible that we will have prolonged use of X-ray fluoroscopy. Potential radiation risks to you include (but are not  limited to) the following: - A slightly elevated risk for cancer  several years later in life. This risk is typically less than 0.5% percent. This risk is low in comparison to the normal incidence of human cancer, which is 33% for women and 50% for men according to the [place]. - Radiation induced injury can include skin redness, resembling a rash, tissue breakdown / ulcers and hair loss (which can be temporary or permanent).  The likelihood of either of these occurring depends on the difficulty of the procedure and whether you are sensitive to radiation due to previous procedures, disease, or genetic conditions.  IF your procedure requires a prolonged use of radiation, you will be notified and given written instructions for further action.  It is your responsibility to monitor the irradiated area for the 2 weeks following the procedure and to notify your physician if you are concerned that you have suffered a radiation induced injury.    All of the patient's questions were answered, patient is agreeable to proceed.  Consent signed and in chart.  Thank you for this interesting consult.  I greatly enjoyed meeting Deacon Gadbois and look forward to participating in their care.  A copy of this report was sent to the requesting provider on this date.  Electronically Signed: Joaquim Nam, PA-C 01/25/2022, 11:34 AM   I spent a total of 13 Miinutes in face to face in clinical consultation, greater than 50% of which was counseling/coordinating care for  basilar artery stenosis.

## 2022-01-26 ENCOUNTER — Inpatient Hospital Stay (HOSPITAL_COMMUNITY): Payer: Medicare Other

## 2022-01-26 DIAGNOSIS — I639 Cerebral infarction, unspecified: Secondary | ICD-10-CM | POA: Diagnosis not present

## 2022-01-26 HISTORY — PX: IR ANGIO INTRA EXTRACRAN SEL COM CAROTID INNOMINATE BILAT MOD SED: IMG5360

## 2022-01-26 HISTORY — PX: IR ANGIO VERTEBRAL SEL SUBCLAVIAN INNOMINATE BILAT MOD SED: IMG5366

## 2022-01-26 LAB — BASIC METABOLIC PANEL
Anion gap: 8 (ref 5–15)
BUN: 11 mg/dL (ref 8–23)
CO2: 22 mmol/L (ref 22–32)
Calcium: 8.8 mg/dL — ABNORMAL LOW (ref 8.9–10.3)
Chloride: 108 mmol/L (ref 98–111)
Creatinine, Ser: 0.96 mg/dL (ref 0.61–1.24)
GFR, Estimated: >60 mL/min (ref >60)
Glucose, Bld: 102 mg/dL — ABNORMAL HIGH (ref 70–99)
Potassium: 3.6 mmol/L (ref 3.5–5.1)
Sodium: 138 mmol/L (ref 135–145)

## 2022-01-26 LAB — CBC
HCT: 42.8 % (ref 39.0–52.0)
Hemoglobin: 15.5 g/dL (ref 13.0–17.0)
MCH: 30.8 pg (ref 26.0–34.0)
MCHC: 36.2 g/dL — ABNORMAL HIGH (ref 30.0–36.0)
MCV: 85.1 fL (ref 80.0–100.0)
Platelets: 193 10*3/uL (ref 150–400)
RBC: 5.03 MIL/uL (ref 4.22–5.81)
RDW: 12.4 % (ref 11.5–15.5)
WBC: 8.7 10*3/uL (ref 4.0–10.5)
nRBC: 0 % (ref 0.0–0.2)

## 2022-01-26 LAB — MAGNESIUM: Magnesium: 2 mg/dL (ref 1.7–2.4)

## 2022-01-26 LAB — PROTIME-INR
INR: 1.1 (ref 0.8–1.2)
Prothrombin Time: 13.7 seconds (ref 11.4–15.2)

## 2022-01-26 LAB — PHOSPHORUS: Phosphorus: 2.6 mg/dL (ref 2.5–4.6)

## 2022-01-26 MED ORDER — NITROGLYCERIN 1 MG/10 ML FOR IR/CATH LAB
INTRA_ARTERIAL | Status: AC
  Filled 2022-01-26: qty 10

## 2022-01-26 MED ORDER — FENTANYL CITRATE (PF) 100 MCG/2ML IJ SOLN
INTRAMUSCULAR | Status: AC
  Filled 2022-01-26: qty 2

## 2022-01-26 MED ORDER — CLOPIDOGREL BISULFATE 75 MG PO TABS: 75.0000 mg | ORAL_TABLET | Freq: Every day | ORAL | 2 refills | Status: DC

## 2022-01-26 MED ORDER — HEPARIN SODIUM (PORCINE) 1000 UNIT/ML IJ SOLN
INTRAMUSCULAR | Status: AC
  Filled 2022-01-26: qty 10

## 2022-01-26 MED ORDER — LIDOCAINE HCL 1 % IJ SOLN
INTRAMUSCULAR | Status: AC
  Filled 2022-01-26: qty 20

## 2022-01-26 MED ORDER — VERAPAMIL HCL 2.5 MG/ML IV SOLN
INTRAVENOUS | Status: AC
  Filled 2022-01-26: qty 2

## 2022-01-26 MED ORDER — CLOPIDOGREL BISULFATE 75 MG PO TABS: 75.0000 mg | ORAL_TABLET | Freq: Every day | ORAL | Status: DC

## 2022-01-26 MED ORDER — IOHEXOL 300 MG/ML  SOLN
100.0000 mL | Freq: Once | INTRAMUSCULAR | Status: AC | PRN
  Administered 2022-01-26: 30 mL via INTRA_ARTERIAL

## 2022-01-26 MED ORDER — FENTANYL CITRATE (PF) 100 MCG/2ML IJ SOLN
INTRAMUSCULAR | Status: AC | PRN
  Administered 2022-01-26: 25 ug via INTRAVENOUS

## 2022-01-26 MED ORDER — SODIUM CHLORIDE 0.9 % IV SOLN: INTRAVENOUS | Status: AC

## 2022-01-26 MED ORDER — MIDAZOLAM HCL 2 MG/2ML IJ SOLN
INTRAMUSCULAR | Status: DC
Start: 2022-01-26 — End: 2022-01-26
  Filled 2022-01-26: qty 2

## 2022-01-26 MED ORDER — VANCOMYCIN HCL IN DEXTROSE 1-5 GM/200ML-% IV SOLN
1000.0000 mg | Freq: Once | INTRAVENOUS | Status: DC
Start: 2022-01-26 — End: 2022-01-26

## 2022-01-26 MED ORDER — IOHEXOL 300 MG/ML  SOLN
100.0000 mL | Freq: Once | INTRAMUSCULAR | Status: AC | PRN
  Administered 2022-01-26: 60 mL via INTRA_ARTERIAL

## 2022-01-26 MED ORDER — MIDAZOLAM HCL 2 MG/2ML IJ SOLN
INTRAMUSCULAR | Status: AC | PRN
  Administered 2022-01-26: 1 mg via INTRAVENOUS

## 2022-01-26 MED ORDER — HEPARIN SODIUM (PORCINE) 1000 UNIT/ML IJ SOLN
INTRAMUSCULAR | Status: AC | PRN
  Administered 2022-01-26: 1000 [IU] via INTRAVENOUS

## 2022-01-26 NOTE — Sedation Documentation (Signed)
Vital signs stable. 

## 2022-01-26 NOTE — Sedation Documentation (Signed)
Patient is resting comfortably. 

## 2022-01-26 NOTE — Discharge Summary (Addendum)
Stroke Discharge Summary  Patient ID: Elijah [name]   MRN: [id number]      DOB: 03/22/1935  Date of Admission: 01/23/2022 Date of Discharge: 01/26/2022  Attending Physician:  Stroke, Md, MD, Stroke MD Consultant(s):    None  Patient's PCP:  Libby Maw, MD  DISCHARGE DIAGNOSIS: Posterior circulation TIA treated with IV TNK with full recovery and MRI showing no infarct secondary to multivessel occlusion in the vertebral/basilar system  Principal Problem:   Stroke (cerebrum) ([place]) Dizziness Syncope Basilar occlusion Bilateral vertebral occlusion   Allergies as of 01/26/2022       Reactions   Penicillins Hives        Medication List     TAKE these medications    albuterol 108 (90 Base) MCG/ACT inhaler Commonly known as: VENTOLIN HFA Inhale 1-2 puffs into the lungs every 4 (four) hours as needed for wheezing or shortness of breath.   amLODipine 10 MG tablet Commonly known as: NORVASC Take 1 tablet (10 mg total) by mouth daily.   aspirin EC 81 MG tablet Take 1 tablet (81 mg total) by mouth daily.   atorvastatin 40 MG tablet Commonly known as: LIPITOR Take 1 tablet (40 mg total) by mouth daily at 6 PM. What changed: when to take this   cholecalciferol 25 MCG (1000 UNIT) tablet Commonly known as: VITAMIN D3 Take 2,000 Units by mouth daily.   clopidogrel 75 MG tablet Commonly known as: PLAVIX Take 1 tablet (75 mg total) by mouth daily. Start taking on: January 27, 2022   fluticasone 50 MCG/ACT nasal spray Commonly known as: FLONASE USE 2 SPRAYS IN EACH NOSTRIL DURING ALLERGY SEASON. What changed: See the new instructions.   losartan 50 MG tablet Commonly known as: COZAAR Take 1 tablet (50 mg total) by mouth daily.        LABORATORY STUDIES CBC    Component Value Date/Time   WBC 8.7 01/26/2022 0316   RBC 5.03 01/26/2022 0316   HGB 15.5 01/26/2022 0316   HCT 42.8 01/26/2022 0316   PLT 193 01/26/2022 0316   MCV 85.1 01/26/2022 0316   MCH  30.8 01/26/2022 0316   MCHC 36.2 (H) 01/26/2022 0316   RDW 12.4 01/26/2022 0316   LYMPHSABS 1.3 01/25/2022 1126   MONOABS 0.9 01/25/2022 1126   EOSABS 0.2 01/25/2022 1126   BASOSABS 0.1 01/25/2022 1126   CMP    Component Value Date/Time   NA 138 01/26/2022 0316   K 3.6 01/26/2022 0316   CL 108 01/26/2022 0316   CO2 22 01/26/2022 0316   GLUCOSE 102 (H) 01/26/2022 0316   BUN 11 01/26/2022 0316   CREATININE 0.96 01/26/2022 0316   CALCIUM 8.8 (L) 01/26/2022 0316   PROT 5.6 (L) 01/24/2022 0041   ALBUMIN 3.1 (L) 01/24/2022 0041   AST 16 01/24/2022 0041   ALT 13 01/24/2022 0041   ALKPHOS 49 01/24/2022 0041   BILITOT 1.1 01/24/2022 0041   GFRNONAA >60 01/26/2022 0316   GFRAA >60 09/05/2019 0813   COAGS Lab Results  Component Value Date   INR 1.1 01/26/2022   INR 1.1 01/23/2022   Lipid Panel    Component Value Date/Time   CHOL 108 01/24/2022 0041   TRIG 38 01/24/2022 0041   HDL 47 01/24/2022 0041   CHOLHDL 2.3 01/24/2022 0041   VLDL 8 01/24/2022 0041   LDLCALC 53 01/24/2022 0041   HgbA1C  Lab Results  Component Value Date   HGBA1C 5.4 01/23/2022   Urinalysis  Component Value Date/Time   COLORURINE YELLOW 01/23/2022 1900   APPEARANCEUR CLEAR 01/23/2022 1900   LABSPEC 1.025 01/23/2022 1900   PHURINE 6.0 01/23/2022 1900   GLUCOSEU NEGATIVE 01/23/2022 1900   GLUCOSEU NEGATIVE 07/29/2021 0906   HGBUR NEGATIVE 01/23/2022 1900   BILIRUBINUR NEGATIVE 01/23/2022 1900   KETONESUR NEGATIVE 01/23/2022 1900   PROTEINUR NEGATIVE 01/23/2022 1900   UROBILINOGEN 1.0 07/29/2021 0906   NITRITE NEGATIVE 01/23/2022 1900   LEUKOCYTESUR NEGATIVE 01/23/2022 1900   Urine Drug Screen     Component Value Date/Time   LABOPIA NONE DETECTED 01/23/2022 1900   COCAINSCRNUR NONE DETECTED 01/23/2022 1900   LABBENZ NONE DETECTED 01/23/2022 1900   AMPHETMU NONE DETECTED 01/23/2022 1900   THCU NONE DETECTED 01/23/2022 1900   LABBARB NONE DETECTED 01/23/2022 1900    Alcohol Level     Component Value Date/Time   ETH <10 01/23/2022 1658     SIGNIFICANT DIAGNOSTIC STUDIES MR BRAIN WO CONTRAST  Result Date: 01/24/2022 CLINICAL DATA:  Stroke, follow-up status post TNK. EXAM: MRI HEAD WITHOUT CONTRAST TECHNIQUE: Multiplanar, multiecho pulse sequences of the brain and surrounding structures were obtained without intravenous contrast. COMPARISON:  CT head and CTA head neck 01/23/2022. MR head without contrast 09/03/2019. FINDINGS: Brain: No acute infarct, hemorrhage, or mass lesion is present. Remote infarcts of the cerebellum again noted bilaterally. Mild periventricular and subcortical T2 hyperintensities otherwise are likely within normal limits. Insert proportionate scratched at mild atrophy is present. The ventricles are proportionate to the degree of atrophy. No significant extraaxial fluid collection is present. Vascular: Abnormal signal present in the left vertebral artery and at vertebrobasilar junction. Flow void is present in the distal basilar artery. Flow is present in the internal carotid arteries and branch vessels bilaterally. Prominent right posterior communicating artery is noted. Skull and upper cervical spine: The craniocervical junction is normal. Upper cervical spine is within normal limits. Marrow signal is unremarkable. Sinuses/Orbits: The paranasal sinuses and mastoid air cells are clear. Right lens replacement is present. Globes and orbits are otherwise within normal limits. IMPRESSION: 1. No acute intracranial abnormality or significant interval change. 2. Remote infarcts of the cerebellum bilaterally. 3. Abnormal signal in the left vertebral artery and at the vertebrobasilar junction suggesting slow flow or chronic occlusion. 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 Electronically Signed   By: San Morelle M.D.   On: 01/24/2022 19:43   ECHOCARDIOGRAM COMPLETE  Result Date: 01/24/2022    ECHOCARDIOGRAM REPORT    Patient Name:   Elijah [name] Date of Exam: 01/24/2022 Medical Rec #:  [id number]      Height:       67.0 in Accession #:    [id number]     Weight:       153.4 lb Date of Birth:  1934-10-12       BSA:          1.807 m Patient Age:    87 years       BP:           116/67 mmHg Patient Gender: M              HR:           67 bpm. Exam Location:  Inpatient Procedure: 2D Echo Indications:    stroke  History:        Patient has prior history of Echocardiogram examinations, most  recent 09/04/2019. Risk Factors:Hypertension and Dyslipidemia.  Sonographer:    Johny Chess RDCS Referring Phys: [phone] DENISE A WOLFE  Sonographer Comments: Suboptimal subcostal window. Image acquisition challenging due to respiratory motion. IMPRESSIONS  1. Left ventricular ejection fraction, by estimation, is 50 to 55%. The left ventricle has low normal function. The left ventricle has no regional wall motion abnormalities. Left ventricular diastolic parameters were normal.  2. Right ventricular systolic function is normal. The right ventricular size is normal. There is normal pulmonary artery systolic pressure. The estimated right ventricular systolic pressure is 14.9 mmHg.  3. Left atrial size was mildly dilated.  4. The mitral valve is normal in structure. Mild mitral valve regurgitation.  5. The aortic valve is tricuspid. There is mild calcification of the aortic valve. Aortic valve regurgitation is trivial. Aortic valve sclerosis is present, with no evidence of aortic valve stenosis.  6. There is mild dilatation of the ascending aorta, measuring 39 mm. FINDINGS  Left Ventricle: Left ventricular ejection fraction, by estimation, is 50 to 55%. The left ventricle has low normal function. The left ventricle has no regional wall motion abnormalities. The left ventricular internal cavity size was normal in size. There is no left ventricular hypertrophy. Left ventricular diastolic parameters were normal. Normal left ventricular  filling pressure. Right Ventricle: The right ventricular size is normal. No increase in right ventricular wall thickness. Right ventricular systolic function is normal. There is normal pulmonary artery systolic pressure. The tricuspid regurgitant velocity is 2.28 m/s, and  with an assumed right atrial pressure of 3 mmHg, the estimated right ventricular systolic pressure is 70.2 mmHg. Left Atrium: Left atrial size was mildly dilated. Right Atrium: Right atrial size was normal in size. Pericardium: There is no evidence of pericardial effusion. Mitral Valve: The mitral valve is normal in structure. Mild mitral annular calcification. Mild mitral valve regurgitation, with centrally-directed jet. Tricuspid Valve: The tricuspid valve is normal in structure. Tricuspid valve regurgitation is trivial. Aortic Valve: The aortic valve is tricuspid. There is mild calcification of the aortic valve. Aortic valve regurgitation is trivial. Aortic valve sclerosis is present, with no evidence of aortic valve stenosis. Pulmonic Valve: The pulmonic valve was grossly normal. Pulmonic valve regurgitation is not visualized. Aorta: The aortic root is normal in size and structure. There is mild dilatation of the ascending aorta, measuring 39 mm. IAS/Shunts: No atrial level shunt detected by color flow Doppler.  LEFT VENTRICLE PLAX 2D LVIDd:         5.20 cm     Diastology LVIDs:         3.70 cm     LV e' medial:    8.27 cm/s LV PW:         0.80 cm     LV E/e' medial:  10.2 LV IVS:        0.70 cm     LV e' lateral:   11.10 cm/s                            LV E/e' lateral: 7.6  LV Volumes (MOD) LV vol d, MOD A2C: 99.8 ml LV vol d, MOD A4C: 92.6 ml LV vol s, MOD A2C: 54.8 ml LV vol s, MOD A4C: 50.2 ml LV SV MOD A2C:     45.0 ml LV SV MOD A4C:     92.6 ml LV SV MOD BP:      43.8 ml RIGHT VENTRICLE  IVC RV S prime:     11.70 cm/s  IVC diam: 1.50 cm TAPSE (M-mode): 2.4 cm LEFT ATRIUM           Index        RIGHT ATRIUM           Index LA  diam:      3.70 cm 2.05 cm/m   RA Area:     12.70 cm LA Vol (A4C): 48.2 ml 26.68 ml/m  RA Volume:   25.80 ml  14.28 ml/m  AORTIC VALVE LVOT Vmax:   122.00 cm/s LVOT Vmean:  79.100 cm/s LVOT VTI:    0.244 m  AORTA Ao Root diam: 3.60 cm Ao Asc diam:  3.90 cm MITRAL VALVE               TRICUSPID VALVE MV Area (PHT): 3.08 cm    TR Peak grad:   20.8 mmHg MV Decel Time: 246 msec    TR Vmax:        228.00 cm/s MV E velocity: 84.60 cm/s MV A velocity: 75.50 cm/s  SHUNTS MV E/A ratio:  1.12        Systemic VTI: 0.24 m Dani Gobble Croitoru MD Electronically signed by Sanda Klein MD Signature Date/Time: 01/24/2022/2:03:15 PM    Final    CT ANGIO HEAD NECK W WO CM (CODE STROKE)  Result Date: 01/23/2022 CLINICAL DATA:  Dizziness.  Syncope. EXAM: CT ANGIOGRAPHY HEAD AND NECK TECHNIQUE: Multidetector CT imaging of the head and neck was performed using the standard protocol during bolus administration of intravenous contrast. Multiplanar CT image reconstructions and MIPs were obtained to evaluate the vascular anatomy. Carotid stenosis measurements (when applicable) are obtained utilizing NASCET criteria, using the distal internal carotid diameter as the denominator. RADIATION DOSE REDUCTION: This exam was performed according to the departmental dose-optimization program which includes automated exposure control, adjustment of the mA and/or kV according to patient size and/or use of iterative reconstruction technique. CONTRAST:  44m OMNIPAQUE IOHEXOL 350 MG/ML SOLN COMPARISON:  CTA head and neck 09/03/2019 FINDINGS: CTA NECK FINDINGS Aortic arch: Atherosclerotic changes are present in the distal arch. 3 vessel arch configuration is present. No significant stenosis or aneurysm is present. Calcifications are present at the origin of the right subclavian artery with less than 50% stenosis, similar the prior exam. Right carotid system: Right common carotid artery demonstrates some mural calcification without significant stenosis.  Calcifications are present the proximal right ICA, similar the prior study. No significant stenosis is present. Cervical right ICA is otherwise normal. Left carotid system: Left common carotid artery is within normal limits. Atherosclerotic changes are present in proximal left ICA without significant stenosis. Cervical left ICA is otherwise normal. Vertebral arteries: The vertebral arteries are occluded proximally bilaterally. Vertebral arteries are reconstituted in the V2 segments bilaterally. Right vertebral artery is dominant. Skeleton: Multilevel degenerative changes of the cervical spine are stable. No focal osseous lesions are present. Other neck: Soft tissues the neck are otherwise unremarkable. Salivary glands are within normal limits. Thyroid is normal. No significant adenopathy is present. No focal mucosal or submucosal lesions are present. Upper chest: Lung apices are clear. Thoracic inlet is within normal limits. Review of the MIP images confirms the above findings CTA HEAD FINDINGS Anterior circulation: Atherosclerotic calcifications are present within the cavernous internal carotid arteries bilaterally without a significant stenosis through the ICA termini. The A1 and M1 segments are normal. Anterior communicating artery is patent. MCA bifurcations are within normal limits bilaterally. The ACA  and MCA branch vessels are within normal limits. Posterior circulation: The right vertebral artery is dominant. PICA origins are visualized and within normal limits. High-grade stenosis or occlusion is present in the distal left V4 segment. This has progressed since the prior exam. Moderate stenosis is present in the distal right V4 segment. Marked attenuation of the basilar artery has progressed with high-grade proximal stenosis. Prominent posterior communicating arteries are present bilaterally. P1 segment is larger on the right than left, similar the prior exam. Flow in the PCA vessels has improved bilaterally  since the prior exam. Venous sinuses: The dural sinuses are patent. The straight sinus deep cerebral veins are intact. Cortical veins are within normal limits. No significant vascular malformation is evident. Anatomic variants: Fetal type left posterior cerebral artery. Review of the MIP images confirms the above findings IMPRESSION: 1. 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 2. Moderate stenosis of the distal right V4 segment. 3. Progressive attenuation of the basilar artery with high-grade proximal stenosis. 4. Improved flow in the PCA vessels bilaterally with significant contributions from the posterior communicating arteries bilaterally. 5. 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 6. Stable atherosclerotic changes of the carotid bifurcations bilaterally without significant stenosis. 7. No significant stenosis in the anterior circulation. 8. Stable advanced degenerative changes of the cervical spine. 9. Aortic Atherosclerosis (ICD10-I70.0). Electronically Signed   By: San Morelle M.D.   On: 01/23/2022 17:22   CT HEAD CODE STROKE WO CONTRAST  Result Date: 01/23/2022 CLINICAL DATA:  Code stroke.  Dizziness, syncope EXAM: CT HEAD WITHOUT CONTRAST TECHNIQUE: Contiguous axial images were obtained from the base of the skull through the vertex without intravenous contrast. RADIATION DOSE REDUCTION: This exam was performed according to the departmental dose-optimization program which includes automated exposure control, adjustment of the mA and/or kV according to patient size and/or use of iterative reconstruction technique. COMPARISON:  09/03/2019 FINDINGS: Brain: No evidence of acute infarction, hemorrhage, cerebral edema, mass, mass effect, or midline shift. No hydrocephalus or extra-axial collection. Remote bilateral cerebellar infarcts. Vascular: No hyperdense vessel. Skull: Negative for fracture or focal lesion. Sinuses/Orbits: No acute  finding. Status post right lens replacement. Other: The mastoid air cells are well aerated. ASPECTS [facility] Stroke Program Early CT Score) - Ganglionic level infarction (caudate, lentiform nuclei, internal capsule, insula, M1-M3 cortex): 7 - Supraganglionic infarction (M4-M6 cortex): 3 Total score (0-10 with 10 being normal): 10 IMPRESSION: 1. No acute intracranial process. 2. ASPECTS is 10 Code stroke imaging results were communicated on 01/23/2022 at 5:03 pm to provider Dr. Leonel Ramsay via secure text paging. Electronically Signed   By: Merilyn Baba M.D.   On: 01/23/2022 17:04      HISTORY OF PRESENT ILLNESS Patient with a history of HTN, HLD, stroke and asthma presented  with acute vertigo and truncal ataxia along with syncopal episode.  HOSPITAL COURSE Patient's acute symptoms were treated with TNK and resolved.  On CTA, patient was found to have high grade stenosis of distal left V4 segment and basilar artery.  He underwent cerebral angiogram which demonstrated occlusion of bilateral vertebral arteries and proximal basilar artery with stenosis of mid basilar artery.  He is not a candidate for endovascular intervention and will be discharged home with DAPT for three months.   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  ASPECTS 10.    CTA head & neck Progressive high-grade stenosis or occlusion of the distal left V4 segment. Moderate stenosis of the distal right V4 segment. Progressive attenuation of the basilar artery with high-grade proximal stenosis. MRI -Remote infarcts of the cerebellum bilaterally, Abnormal signal in the left vertebral artery and at the vertebrobasilar junction suggesting slow flow or chronic occlusion. 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 2D  Echo- 50- 55% LDL 53 HgbA1c 5.4 VTE prophylaxis - SCDs  aspirin 81 mg daily prior to admission, now on No antithrombotic. Recommend Plavix and asa as DAPT therapy for three months once 24 hours post TNK then Plavix alone Therapy recommendations: no PT/OT follow up    Hypertension Home meds:  Losartan, norvasc Stable Permissive hypertension (OK if < 220/120) but gradually normalize in 5-7 days Long-term BP goal normotensive   Hyperlipidemia Home meds:  Atorvastatin '40mg'$ , resumed in hospital LDL 53, goal < 70  Continue statin at discharge   Other Stroke Risk Factors Advanced Age >/= 5  RN Pressure Injury Documentation:     DISCHARGE EXAM Blood pressure 137/81, pulse 61, temperature 97.6 F (36.4 C), temperature source Oral, resp. rate 16, height '5\' 7"'$  (1.702 m), weight 69.6 kg, SpO2 94 %. Constitutional: Appears well-developed and well-nourished.  Respiratory: Effort normal and breath sounds normal to anterior ascultation   Neuro: Mental Status: Patient is awake, alert, oriented to person, place, month, year, and situation. Patient is not able to give a clear and coherent history. No signs of aphasia or neglect Cranial Nerves: II: Visual Fields are full,  Pupils are equal, round, and reactive to light.   III,IV, VI: EOMI without ptosis or diploplia.  V: Facial sensation is symmetric to temperature VII: Facial movement is symmetric.  VIII: hearing is intact to voice. XII: tongue is midline without atrophy or fasciculations.  Motor: Tone is normal. Bulk is normal. 5/5 strength was present in all four extremities.  Sensory: Sensation is symmetric to light touch in the arms and legs. Cerebellar: FNF are intact bilaterally Gait deferred for patient's safety  Discharge Diet       Diet   Diet Heart Room service appropriate? Yes; Fluid consistency: Thin   liquids  DISCHARGE PLAN Disposition:  home aspirin 81 mg daily and clopidogrel 75 mg daily for secondary stroke  prevention for 3 months then aspirin alone. Ongoing stroke risk factor control by Primary Care Physician at time of discharge Follow-up PCP Libby Maw, MD in 2 weeks. Follow-up in [facility] in 8 weeks, office to schedule an appointment.   32 minutes were spent preparing discharge.  Elkins , MSN, AGACNP-BC [facility] See Amion for schedule and pager information 01/26/2022 2:46 PM   I have personally obtained history,examined this patient, reviewed notes, independently viewed imaging studies, participated in medical decision making and plan of care.ROS completed by me personally and pertinent positives fully documented  I have made any additions or clarifications directly to the above note. Agree with note above.  Patient advised to keep himself well-hydrated and drink at least 8 glasses of fluids/liquids a day  Antony Contras, MD [place] Pager: [phone] 01/26/2022 5:09 PM

## 2022-01-26 NOTE — Progress Notes (Signed)
Patient returned to unit from procedure, orders explained to patient/family. He is aware to be on bedrest until 1415 PM. Right fem area noted CDI. Pt denied pain or discomfort. VSS. Will continue to monitor.

## 2022-01-26 NOTE — Plan of Care (Addendum)
AVS REVIEWED WITH PATIENT/FAMILY. ALL QUESTIONS ANSWERED AT THIS TIME. FAMILY WILL TRANSPORT TO DISPOSITION.   Problem: Education: Goal: Knowledge of General Education information will improve Description: Including pain rating scale, medication(s)/side effects and non-pharmacologic comfort measures Outcome: Adequate for Discharge   Problem: Health Behavior/Discharge Planning: Goal: Ability to manage health-related needs will improve Outcome: Adequate for Discharge   Problem: Clinical Measurements: Goal: Ability to maintain clinical measurements within normal limits will improve Outcome: Adequate for Discharge Goal: Will remain free from infection Outcome: Adequate for Discharge Goal: Diagnostic test results will improve Outcome: Adequate for Discharge Goal: Respiratory complications will improve Outcome: Adequate for Discharge Goal: Cardiovascular complication will be avoided Outcome: Adequate for Discharge

## 2022-01-26 NOTE — Procedures (Signed)
INR.. Status post four-vessel cerebral arteriogram.  Right CFA approach. Findings. 1.  Bilaterally occluded vertebral arteries at the origins.  Reconstitution from collaterals arising from the ascending cervical branches of the thyrocervical trunk bilaterally to just distal to the posterior inferior cerebral arteries bilaterally. 2.  Occluded proximal basilar artery. 3.  Retrograde opacification of the distal two thirds of the basilar artery from the right internal carotid artery via the right posterior communicating artery. 4.  Severe stenosis of the mid basilar artery.  Arlean Hopping MD

## 2022-01-26 NOTE — Care Management Important Message (Signed)
Important Message  Patient Details  Name: Elijah [name] MRN: [id number] Date of Birth: 29-Dec-1934   Medicare Important Message Given:  Yes     Ary Lavine Montine Circle 01/26/2022, 3:14 PM

## 2022-01-26 NOTE — Sedation Documentation (Signed)
Pt resting on procedure table, VSS, procedure started

## 2022-01-26 NOTE — Discharge Instructions (Signed)
Elijah [name], you were admitted with vertigo and ataxia and were found to have some narrowing of the arteries supplying the posterior portion of your brain.  These areas of narrowing could not be addressed with a stent, so you will be discharged home on aspirin and plavix for three months followed by aspirin alone.

## 2022-01-26 NOTE — Sedation Documentation (Signed)
Vital signs stable. Procedure continues °

## 2022-01-26 NOTE — Sedation Documentation (Signed)
Patient is resting comfortably. VSS °

## 2022-01-26 NOTE — Sedation Documentation (Signed)
71F exoseal to right groin, level 0. Quik clot, gauze, tegaderm and pressure dressing applied to R groin.

## 2022-01-26 NOTE — Sedation Documentation (Signed)
Pt toelrated procedure very well.   Totals: Time 37 mins Fentanyl 86mg Versed '1mg'$  Heparin 1,000 units

## 2022-01-26 NOTE — TOC Transition Note (Signed)
Transition of Care [facility]) - CM/SW Discharge Note   Patient Details  Name: Elijah [name] MRN: [id number] Date of Birth: [birth year]  [facility] CM/SW Contact:  Pollie Friar, RN Phone Number: 01/26/2022, 3:18 PM   Clinical Narrative:    Pt is discharging home with self care. No f/u per PT/OT and no DME needs.  Pt has transportation home.    Final next level of care: Home/Self Care Barriers to Discharge: No Barriers Identified   Patient Goals and CMS Choice        Discharge Placement                       Discharge Plan and Services                                     Social Determinants of Health (SDOH) Interventions     Readmission Risk Interventions     No data to display

## 2022-01-26 NOTE — Progress Notes (Addendum)
STROKE TEAM PROGRESS NOTE   INTERVAL HISTORY Patient is seen in his room with no family at the bedside.  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  He is not a candidate for any endovascular intervention as per Dr. Estanislado Pandy  interventional neuroradiologist vital signs are stable and plan is to discharge patient home today.  Vital signs stable.  Neurological exam unchanged  Vitals:   01/26/22 1007 01/26/22 1015 01/26/22 1030 01/26/22 1057  BP: 132/84 135/85 135/85 137/81  Pulse: 64 68 66 61  Resp: (!) '9 14 15 16  '$ Temp:    97.6 F (36.4 C)  TempSrc:    Oral  SpO2: 96% 99% 96% 94%  Weight:      Height:       CBC:  Recent Labs  Lab 01/23/22 1658 01/24/22 0041 01/25/22 1126 01/26/22 0316  WBC 8.9   < > 9.1 8.7  NEUTROABS 4.8  --  6.6  --   HGB 14.5   < > 14.7 15.5  HCT 39.8   < > 40.2 42.8  MCV 85.0   < > 84.6 85.1  PLT 236   < > 205 193   < > = values in this interval not displayed.   Basic Metabolic Panel:  Recent Labs  Lab 01/25/22 1126 01/26/22 0316  NA 138 138  K 3.6 3.6  CL 108 108  CO2 25 22  GLUCOSE 113* 102*  BUN 11 11  CREATININE 0.91 0.96  CALCIUM 8.9 8.8*  MG 1.6* 2.0  PHOS 2.4* 2.6   Lipid Panel:  Recent Labs  Lab 01/24/22 0041  CHOL 108  TRIG 38  HDL 47  CHOLHDL 2.3  VLDL 8  LDLCALC 53   HgbA1c:  Recent Labs  Lab 01/23/22 1658  HGBA1C 5.4   Urine Drug Screen:  Recent Labs  Lab 01/23/22 1900  LABOPIA NONE DETECTED  COCAINSCRNUR NONE DETECTED  LABBENZ NONE DETECTED  AMPHETMU NONE DETECTED  THCU NONE DETECTED  LABBARB NONE DETECTED    Alcohol Level  Recent Labs  Lab 01/23/22 1658  ETH <10    IMAGING past 24 hours No results found.  PHYSICAL EXAM Constitutional: Appears well-developed and well-nourished.  Respiratory: Effort normal and breath sounds normal to anterior ascultation    Neuro: Mental Status: Patient is awake, alert, oriented to person, place, month, year, and situation. Patient is not able to give a clear and coherent history. No signs of aphasia or neglect Cranial Nerves: II: Visual Fields are full,  Pupils are equal, round, and reactive to light.   III,IV, VI: EOMI without ptosis or diploplia.  V: Facial sensation is symmetric to temperature VII: Facial movement is symmetric.  VIII: hearing is intact to voice. XII: tongue is midline without atrophy or fasciculations.  Motor: Tone is normal. Bulk is normal. 5/5 strength was present in all four extremities.  Sensory: Sensation is symmetric to light touch in the arms and legs. Cerebellar: FNF are intact bilaterally Gait deferred for patient's safety  ASSESSMENT/PLAN [name] is a 87 y.o. male with history of HTN, HLD, stroke, asthma and vitamin d deficiency who presents to [facility] ED via EMS for acute onset of dizziness and syncopal episode.  Plan for cerebral angiogram with Dr. Estanislado Pandy on Monday.  MRI is pending, once 24 hours post TNK will initiate DAPT therapy with Brilinta and aspirin given the high-grade  stenosis in the vertebral and basilar arteries.  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 ASPECTS 10.    CTA head & neck Progressive high-grade stenosis or occlusion of the distal left V4 segment. Moderate stenosis of the distal right V4 segment. Progressive attenuation of the basilar artery with high-grade proximal stenosis. MRI -Remote infarcts of the cerebellum bilaterally, Abnormal signal in the left vertebral artery and at the vertebrobasilar junction suggesting slow flow or chronic occlusion. 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 2D Echo- 50- 55% LDL  53 HgbA1c 5.4 VTE prophylaxis - SCDs    Diet   Diet Heart Room service appropriate? Yes; Fluid consistency: Thin   aspirin 81 mg daily prior to admission, now on No antithrombotic. Recommend Plavix and asa as DAPT therapy for three months once 24 hours post TNK then Plavix alone Therapy recommendations: no PT/OT follow up Disposition: Pending  Hypertension Home meds:  Losartan, norvasc Stable Permissive hypertension (OK if < 220/120) but gradually normalize in 5-7 days Long-term BP goal normotensive  Hyperlipidemia Home meds:  Atorvastatin '40mg'$ , resumed in hospital LDL 53, goal < 70  Continue statin at discharge  Other Stroke Risk Factors Advanced Age >/= 31   Other Active Problems Hypokalemia, hypomagnesemia Replete, Daily BMP  Hospital day # 3  Patient presented with symptoms of posterior suppression TIA and was treated with TNK with good recovery and MRI is negative for acute stroke but CT angiogram and subsequently catheter angiogram shows severe occlusive posterior circulation disease but unfortunately he is not a candidate for endovascular intervention secondary to bilaterally occluded vertebral artery at the origins with reconstitution from collaterals from ascending cervical branches and also occluded proximal basilar artery and retrograde of opacification.  There is also severe mid basilar artery stenosis. Recommend dual antiplatelet therapy aspirin and Plavix for 3 months followed by Plavix alone and aggressive risk factor modification.  Patient advised to drink adequate fluids and keep himself well-hydrated.  Patient seems reluctant any way to consider endovascular intervention.  Follow-up as outpatient stroke clinic in 2 months.  Discussed with Dr. Estanislado Pandy. Antony Contras, MD To contact Stroke Continuity provider, please refer to [email or web address] After hours, contact General Neurology

## 2022-01-26 NOTE — Progress Notes (Signed)
Rn respond to bed alarm and noted patient getting OOB changing his clothes and TELE monitor. Patient is alert x4 w/ exception of HOH. Patient refused TELE and states that "the doctor told me to go home" RN reiterating that he is still on bedrest until 1415 and no order for discharge from MD at this time. TELE removed per request. Will reach out to MD regarding discharge. Will continue to monitor.

## 2022-01-27 ENCOUNTER — Ambulatory Visit: Payer: Self-pay

## 2022-01-27 ENCOUNTER — Ambulatory Visit: Payer: Medicare Other | Admitting: Family Medicine

## 2022-01-27 NOTE — Patient Outreach (Signed)
  Care Coordination [facility] Note Transition Care Management Follow-up Telephone Call Date of discharge and from where: [facility] 01/23/22-01/26/22 How have you been since you were released from the hospital? "So far so good! I am feeling well". Any questions or concerns? No  Items Reviewed: Did the pt receive and understand the discharge instructions provided? Yes  Medications obtained and verified? Yes  Other? No  Any new allergies since your discharge? No  Dietary orders reviewed? Yes Do you have support at home? Yes -spouse  Home Care and Equipment/Supplies: Were home health services ordered? no If so, what is the name of the agency? N/A  Has the agency set up a time to come to the patient's home? no Were any new equipment or medical supplies ordered?  No What is the name of the medical supply agency? N/A Were you able to get the supplies/equipment? no Do you have any questions related to the use of the equipment or supplies? No  Functional Questionnaire: (I = Independent and D = Dependent) ADLs: I  Bathing/Dressing- I  Meal Prep- I  Eating- I  Maintaining continence- I  Transferring/Ambulation- I  Managing Meds- I  Follow up appointments reviewed:  PCP Hospital f/u appt confirmed? No  Specialist Hospital f/u appt confirmed? No   Are transportation arrangements needed? Yes  If their condition worsens, is the pt aware to call PCP or go to the Emergency Dept.? Yes Was the patient provided with contact information for the PCP's office or ED? Yes Was to pt encouraged to call back with questions or concerns? Yes  SDOH assessments and interventions completed:   No  Care Coordination Interventions Activated:  No Care Coordination Interventions:  PCP follow up appointment requested along with neurology in 6 weeks  Encounter Outcome:  Pt. Visit Completed

## 2022-01-28 ENCOUNTER — Ambulatory Visit: Payer: Medicare Other | Admitting: Family Medicine

## 2022-02-05 ENCOUNTER — Ambulatory Visit (INDEPENDENT_AMBULATORY_CARE_PROVIDER_SITE_OTHER): Payer: Medicare Other | Admitting: Family Medicine

## 2022-02-05 ENCOUNTER — Encounter: Payer: Self-pay | Admitting: Family Medicine

## 2022-02-05 VITALS — BP 118/74 | HR 62 | Temp 97.4°F | Ht 67.0 in | Wt 150.2 lb

## 2022-02-05 DIAGNOSIS — I10 Essential (primary) hypertension: Secondary | ICD-10-CM | POA: Diagnosis not present

## 2022-02-05 DIAGNOSIS — E782 Mixed hyperlipidemia: Secondary | ICD-10-CM

## 2022-02-05 DIAGNOSIS — I63541 Cerebral infarction due to unspecified occlusion or stenosis of right cerebellar artery: Secondary | ICD-10-CM | POA: Diagnosis not present

## 2022-02-05 DIAGNOSIS — Z8673 Personal history of transient ischemic attack (TIA), and cerebral infarction without residual deficits: Secondary | ICD-10-CM

## 2022-02-05 MED ORDER — ATORVASTATIN CALCIUM 80 MG PO TABS: 80.0000 mg | ORAL_TABLET | Freq: Every day | ORAL | 3 refills | Status: DC

## 2022-02-05 NOTE — Progress Notes (Signed)
Established Patient Office Visit  Subjective   Patient ID: Elijah [name], male    DOB: 02/23/1935  Age: 87 y.o. MRN: [id number]  Chief Complaint  Patient presents with   Follow-up    6 month follow, hospital put patient on Plavix for 3 months not sure what he will do after that completed, covid boosters, okay to have drink before dinner.  Patient fasting.     HPI for hospital discharge follow-up status post CVA involving occlusion in the vertebral basilar arterial symptom.  TNK was used.  He was started on clopidogrel with 3 months of therapy.  Blood pressure has been well-controlled.  40 mg atorvastatin LDL cholesterol upper 70s.  He does not smoke.  Been exercising regularly.  He does have 1-1/2 ounces of bourbon and no more nightly.  Brings in a list of blood pressures running in the 120-130/60-80 range.    Review of Systems  Constitutional: Negative.   HENT: Negative.    Eyes:  Negative for blurred vision, discharge and redness.  Respiratory: Negative.    Cardiovascular: Negative.   Gastrointestinal:  Negative for abdominal pain.  Genitourinary: Negative.   Musculoskeletal: Negative.  Negative for myalgias.  Skin:  Negative for rash.  Neurological:  Negative for dizziness, tingling, loss of consciousness, weakness and headaches.  Endo/Heme/Allergies:  Negative for polydipsia.  Psychiatric/Behavioral: Negative.        Objective:     BP 118/74 (BP Location: Left Arm, Patient Position: Sitting, Cuff Size: Normal)   Pulse 62   Temp (!) 97.4 F (36.3 C) (Temporal)   Ht '5\' 7"'$  (1.702 m)   Wt 150 lb 3.2 oz (68.1 kg)   SpO2 97%   BMI 23.52 kg/m    Physical Exam Constitutional:      General: He is not in acute distress.    Appearance: Normal appearance. He is not ill-appearing, toxic-appearing or diaphoretic.  HENT:     Head: Normocephalic and atraumatic.     Right Ear: External ear normal.     Left Ear: External ear normal.     Mouth/Throat:     Mouth: Mucous  membranes are moist.     Pharynx: Oropharynx is clear. No oropharyngeal exudate or posterior oropharyngeal erythema.  Eyes:     General: No scleral icterus.       Right eye: No discharge.        Left eye: No discharge.     Extraocular Movements: Extraocular movements intact.     Conjunctiva/sclera: Conjunctivae normal.     Pupils: Pupils are equal, round, and reactive to light.  Cardiovascular:     Rate and Rhythm: Normal rate and regular rhythm.  Pulmonary:     Effort: Pulmonary effort is normal. No respiratory distress.     Breath sounds: Normal breath sounds.  Abdominal:     General: Bowel sounds are normal.     Tenderness: There is no abdominal tenderness. There is no guarding.  Musculoskeletal:     Cervical back: No rigidity or tenderness.  Skin:    General: Skin is warm and dry.  Neurological:     Mental Status: He is alert and oriented to person, place, and time.     Cranial Nerves: No dysarthria or facial asymmetry.     Motor: No weakness.  Psychiatric:        Mood and Affect: Mood normal.        Behavior: Behavior normal.      No results found for any visits on  02/05/22.    The ASCVD Risk score (Arnett DK, et al., 2019) failed to calculate for the following reasons:   The 2019 ASCVD risk score is only valid for ages 56 to 79   The patient has a prior MI or stroke diagnosis    Assessment & Plan:   Problem List Items Addressed This Visit       Cardiovascular and Mediastinum   Essential hypertension   Relevant Medications   atorvastatin (LIPITOR) 80 MG tablet   CVA (cerebral vascular accident) ([place]) - Primary   Relevant Medications   atorvastatin (LIPITOR) 80 MG tablet     Other   HLD (hyperlipidemia)   Relevant Medications   atorvastatin (LIPITOR) 80 MG tablet   Other Visit Diagnoses     History of CVA (cerebrovascular accident)       Relevant Medications   atorvastatin (LIPITOR) 80 MG tablet       Return in about 3 months (around  05/08/2022).   Have increased atorvastatin to 80 mg daily.  Problems with the increased dose. Libby Maw, MD

## 2022-02-10 ENCOUNTER — Ambulatory Visit (INDEPENDENT_AMBULATORY_CARE_PROVIDER_SITE_OTHER): Payer: Medicare Other

## 2022-02-10 DIAGNOSIS — Z0000 Encounter for general adult medical examination without abnormal findings: Secondary | ICD-10-CM

## 2022-02-10 NOTE — Progress Notes (Signed)
Subjective:   Elijah [name] is a 87 y.o. male who presents for an Subsequent  Medicare Annual Wellness Visit.   I connected with Elijah [name]  today by telephone and verified that I am speaking with the correct person using two identifiers. Location patient: home Location provider: work Persons participating in the virtual visit: patient, provider.   I discussed the limitations, risks, security and privacy concerns of performing an evaluation and management service by telephone and the availability of in person appointments. I also discussed with the patient that there may be a patient responsible charge related to this service. The patient expressed understanding and verbally consented to this telephonic visit.    Interactive audio and video telecommunications were attempted between this provider and patient, however failed, due to patient having technical difficulties OR patient did not have access to video capability.  We continued and completed visit with audio only.    Review of Systems     Cardiac Risk Factors include: advanced age (>62mn, >>57women)     Objective:    Today's Vitals   There is no height or weight on file to calculate BMI.     02/10/2022   10:25 AM 01/23/2022    5:43 PM 02/04/2021    9:33 AM 01/24/2020    9:04 AM 09/03/2019   10:45 PM 09/03/2019    2:56 PM 01/18/2019    1:55 PM  Advanced Directives  Does Patient Have a Medical Advance Directive? Yes No No Yes Yes Yes Yes  Type of AParamedicof AOakwoodLiving will   HAvonLiving will H[place]Living will H[place]am[place]Living will HCraigLiving will  Does patient want to make changes to medical advance directive?     No - Patient declined  No - Patient declined  Copy of [place] Chart? No - copy requested   No - copy requested No - copy requested  No - copy requested  Would patient  like information on creating a medical advance directive?   No - Patient declined        Current Medications (verified) Outpatient Encounter Medications as of 02/10/2022  Medication Sig   albuterol (VENTOLIN HFA) 108 (90 Base) MCG/ACT inhaler Inhale 1-2 puffs into the lungs every 4 (four) hours as needed for wheezing or shortness of breath.   amLODipine (NORVASC) 10 MG tablet Take 1 tablet (10 mg total) by mouth daily.   aspirin EC 81 MG tablet Take 1 tablet (81 mg total) by mouth daily.   atorvastatin (LIPITOR) 80 MG tablet Take 1 tablet (80 mg total) by mouth daily.   cholecalciferol (VITAMIN D3) 25 MCG (1000 UNIT) tablet Take 2,000 Units by mouth daily.   clopidogrel (PLAVIX) 75 MG tablet Take 1 tablet (75 mg total) by mouth daily.   fluticasone (FLONASE) 50 MCG/ACT nasal spray USE 2 SPRAYS IN EACH NOSTRIL DURING ALLERGY SEASON. (Patient taking differently: Place 1 spray into both nostrils daily.)   losartan (COZAAR) 50 MG tablet Take 1 tablet (50 mg total) by mouth daily.   No facility-administered encounter medications on file as of 02/10/2022.    Allergies (verified) Penicillins   History: Past Medical History:  Diagnosis Date   Allergy    Arthritis    hands   Asthma    Elevated PSA    s/p neg biopsy   Enlarged prostate    Hyperlipidemia    Hypertension    Stroke ([place]  Wears hearing aid    Past Surgical History:  Procedure Laterality Date   CATARACT EXTRACTION W/PHACO Right 05/08/2015   Procedure: CATARACT EXTRACTION PHACO AND INTRAOCULAR LENS PLACEMENT (IOC);  Surgeon: Leandrew Koyanagi, MD;  Location: [facility];  Service: Ophthalmology;  Laterality: Right;   IR ANGIO INTRA EXTRACRAN SEL COM CAROTID INNOMINATE BILAT MOD SED  01/26/2022   IR ANGIO VERTEBRAL SEL SUBCLAVIAN INNOMINATE BILAT MOD SED  01/26/2022   PROSTATE BIOPSY     TONSILLECTOMY     Family History  Problem Relation Age of Onset   Heart disease Father    Stroke Mother    Cancer Sister     Social History   Socioeconomic History   Marital status: Married    Spouse name: Not on file   Number of children: 1   Years of education: Not on file   Highest education level: Not on file  Occupational History   Occupation: Retired Technical brewer: retired  Tobacco Use   Smoking status: Never   Smokeless tobacco: Never  Substance and Sexual Activity   Alcohol use: Yes    Alcohol/week: 14.0 standard drinks of alcohol    Types: 14 Shots of liquor per week   Drug use: No   Sexual activity: Yes  Other Topics Concern   Not on file  Social History Narrative   One biological son, one adopted son, two granddaughters, 43 & 12 yo      Has living will and HPOA- wife Aeric Burnham.   Would desire CPR.  Would not want prolonged life support.   Social Determinants of Health   Financial Resource Strain: Low Risk  (02/10/2022)   Overall Financial Resource Strain (CARDIA)    Difficulty of Paying Living Expenses: Not hard at all  Food Insecurity: No Food Insecurity (02/10/2022)   Hunger Vital Sign    Worried About Running Out of Food in the Last Year: Never true    Ran Out of Food in the Last Year: Never true  Transportation Needs: No Transportation Needs (02/10/2022)   PRAPARE - Hydrologist (Medical): No    Lack of Transportation (Non-Medical): No  Physical Activity: Sufficiently Active (02/10/2022)   Exercise Vital Sign    Days of Exercise per Week: 5 days    Minutes of Exercise per Session: 30 min  Stress: No Stress Concern Present (02/10/2022)   [place]    Feeling of Stress : Not at all  Social Connections: Moderately Integrated (02/10/2022)   Social Connection and Isolation Panel [NHANES]    Frequency of Communication with Friends and Family: Three times a week    Frequency of Social Gatherings with Friends and Family: Three times a week    Attends Religious Services:  More than 4 times per year    Active Member of Clubs or Organizations: No    Attends Archivist Meetings: Never    Marital Status: Married    Tobacco Counseling Counseling given: Not Answered   Clinical Intake:  Pre-visit preparation completed: Yes  Pain : No/denies pain     Nutritional Risks: None Diabetes: No  How often do you need to have someone help you when you read instructions, pamphlets, or other written materials from your doctor or pharmacy?: 1 - Never What is the last grade level you completed in school?: college  Diabetic?no   Interpreter Needed?: No  Information entered by :: L.Wilson,LPN  Activities of Daily Living    02/10/2022   10:29 AM  In your present state of health, do you have any difficulty performing the following activities:  Hearing? 0  Vision? 0  Difficulty concentrating or making decisions? 0  Walking or climbing stairs? 0  Dressing or bathing? 0  Doing errands, shopping? 0  Preparing Food and eating ? N  Using the Toilet? N  In the past six months, have you accidently leaked urine? N  Do you have problems with loss of bowel control? N  Managing your Medications? N  Managing your Finances? N  Housekeeping or managing your Housekeeping? N    Patient Care Team: Libby Maw, MD as PCP - General (Family Medicine) Raynelle Bring, MD as Consulting Physician (Urology) Birder Robson, MD as Referring Physician (Ophthalmology) Oneta Rack, MD (Dermatology) Boykin Nearing, DDS as Consulting Physician (Dentistry) Leandrew Koyanagi, MD as Referring Physician (Ophthalmology)  Indicate any recent Medical Services you may have received from other than [facility] providers in the past year (date may be approximate).     Assessment:   This is a routine wellness examination for [place].  Hearing/Vision screen Vision Screening - Comments:: Annual eye exams   Dietary issues and exercise activities  discussed: Current Exercise Habits: Home exercise routine, Type of exercise: walking, Time (Minutes): 30, Frequency (Times/Week): 5, Weekly Exercise (Minutes/Week): 150, Intensity: Mild, Exercise limited by: None identified   Goals Addressed   None    Depression Screen    02/10/2022   10:27 AM 02/10/2022   10:24 AM 02/05/2022   10:16 AM 07/29/2021    8:27 AM 04/21/2021    3:50 PM 02/04/2021    9:32 AM 01/24/2021    1:07 PM  PHQ 2/9 Scores  PHQ - 2 Score 0 0 0 0 0 0 0    Fall Risk    02/10/2022   10:26 AM 02/05/2022   10:16 AM 07/29/2021    8:27 AM 04/21/2021    3:50 PM 02/04/2021    9:15 AM  Fall Risk   Falls in the past year? 0 0 0 0 0  Number falls in past yr: 0 0 0 0 0  Injury with Fall? 0    0  Risk for fall due to : Impaired balance/gait      Follow up Falls evaluation completed;Education provided    Falls evaluation completed;Falls prevention discussed    FALL RISK PREVENTION PERTAINING TO THE HOME:  Any stairs in or around the home? Yes  If so, are there any without handrails? No  Home free of loose throw rugs in walkways, pet beds, electrical cords, etc? Yes  Adequate lighting in your home to reduce risk of falls? Yes   ASSISTIVE DEVICES UTILIZED TO PREVENT FALLS:  Life alert? No  Use of a cane, walker or w/c? No  Grab bars in the bathroom? Yes  Shower chair or bench in shower? Yes  Elevated toilet seat or a handicapped toilet? Yes     Cognitive Function: Normal cognitive status assessed by telephone conversation  by this Nurse Health Advisor. No abnormalities found.      06/11/2016   10:28 AM  MMSE - Mini Mental State Exam  Orientation to time 5  Orientation to Place 5  Registration 3  Attention/ Calculation 0  Recall 3  Language- name 2 objects 0  Language- repeat 1  Language- follow 3 step command 3  Language- read & follow direction 0  Write a sentence 0  Copy design 0  Total score 20        02/10/2022   10:29 AM  6CIT Screen  What Year? 0  points  What month? 0 points  What time? 0 points  Count back from 20 0 points  Months in reverse 0 points  Repeat phrase 0 points  Total Score 0 points    Immunizations Immunization History  Administered Date(s) Administered   Fluad Quad(high Dose 65+) 02/27/2019, 03/25/2020, 03/27/2021   Influenza, High Dose Seasonal PF 04/12/2014, 04/05/2017, 03/30/2018   Influenza-Unspecified 04/06/2016   PFIZER(Purple Top)SARS-COV-2 Vaccination 08/13/2019, 09/05/2019, 04/24/2020   Pneumococcal Conjugate-13 02/19/2014   Pneumococcal Polysaccharide-23 09/07/2007   Td 10/31/2008   Zoster Recombinat (Shingrix) 09/25/2020, 01/09/2021   Zoster, Live 09/07/2007    TDAP status: Due, Education has been provided regarding the importance of this vaccine. Advised may receive this vaccine at local pharmacy or Health Dept. Aware to provide a copy of the vaccination record if obtained from local pharmacy or Health Dept. Verbalized acceptance and understanding.  Flu Vaccine status: Up to date  Pneumococcal vaccine status: Up to date  Covid-19 vaccine status: Completed vaccines  Qualifies for Shingles Vaccine? Yes   Zostavax completed Yes   Shingrix Completed?: Yes  Screening Tests Health Maintenance  Topic Date Due   INFLUENZA VACCINE  01/27/2022   Pneumonia Vaccine 65+ Years old  Completed   Zoster Vaccines- Shingrix  Completed   HPV VACCINES  Aged Out   TETANUS/TDAP  Discontinued   COVID-19 Vaccine  Discontinued    Health Maintenance  Health Maintenance Due  Topic Date Due   INFLUENZA VACCINE  01/27/2022    Colorectal cancer screening: No longer required.   Lung Cancer Screening: (Low Dose CT Chest recommended if Age 55-80 years, 30 pack-year currently smoking OR have quit w/in 15years.) does not qualify.   Lung Cancer Screening Referral: n/a  Additional Screening:  Hepatitis C Screening: does not qualify;   Vision Screening: Recommended annual ophthalmology exams for early  detection of glaucoma and other disorders of the eye. Is the patient up to date with their annual eye exam?  Yes  Who is the provider or what is the name of the office in which the patient attends annual eye exams? Dr.Brazington  If pt is not established with a provider, would they like to be referred to a provider to establish care? No .   Dental Screening: Recommended annual dental exams for proper oral hygiene  Community Resource Referral / Chronic Care Management: CRR required this visit?  No   CCM required this visit?  No      Plan:     I have personally reviewed and noted the following in the patient's chart:   Medical and social history Use of alcohol, tobacco or illicit drugs  Current medications and supplements including opioid prescriptions. Patient is not currently taking opioid prescriptions. Functional ability and status Nutritional status Physical activity Advanced directives List of other physicians Hospitalizations, surgeries, and ER visits in previous 12 months Vitals Screenings to include cognitive, depression, and falls Referrals and appointments  In addition, I have reviewed and discussed with patient certain preventive protocols, quality metrics, and best practice recommendations. A written personalized care plan for preventive services as well as general preventive health recommendations were provided to patient.     Daphane Shepherd, LPN   01/02/2375   Nurse Notes: none

## 2022-02-10 NOTE — Patient Instructions (Signed)
[name] , Thank you for taking time to come for your Medicare Wellness Visit. I appreciate your ongoing commitment to your health goals. Please review the following plan we discussed and let me know if I can assist you in the future.   Screening recommendations/referrals: Colonoscopy: no longer required  Recommended yearly ophthalmology/optometry visit for glaucoma screening and checkup Recommended yearly dental visit for hygiene and checkup  Vaccinations: Influenza vaccine: completed  Pneumococcal vaccine: completed  Tdap vaccine: due  Shingles vaccine: completed     Advanced directives: yes   Conditions/risks identified: none   Next appointment: none   Preventive Care 85 Years and Older, Male Preventive care refers to lifestyle choices and visits with your health care provider that can promote health and wellness. What does preventive care include? A yearly physical exam. This is also called an annual well check. Dental exams once or twice a year. Routine eye exams. Ask your health care provider how often you should have your eyes checked. Personal lifestyle choices, including: Daily care of your teeth and gums. Regular physical activity. Eating a healthy diet. Avoiding tobacco and drug use. Limiting alcohol use. Practicing safe sex. Taking low doses of aspirin every day. Taking vitamin and mineral supplements as recommended by your health care provider. What happens during an annual well check? The services and screenings done by your health care provider during your annual well check will depend on your age, overall health, lifestyle risk factors, and family history of disease. Counseling  Your health care provider may ask you questions about your: Alcohol use. Tobacco use. Drug use. Emotional well-being. Home and relationship well-being. Sexual activity. Eating habits. History of falls. Memory and ability to understand (cognition). Work and work  Statistician. Screening  You may have the following tests or measurements: Height, weight, and BMI. Blood pressure. Lipid and cholesterol levels. These may be checked every 5 years, or more frequently if you are over 76 years old. Skin check. Lung cancer screening. You may have this screening every year starting at age 30 if you have a 30-pack-year history of smoking and currently smoke or have quit within the past 15 years. Fecal occult blood test (FOBT) of the stool. You may have this test every year starting at age 27. Flexible sigmoidoscopy or colonoscopy. You may have a sigmoidoscopy every 5 years or a colonoscopy every 10 years starting at age 45. Prostate cancer screening. Recommendations will vary depending on your family history and other risks. Hepatitis C blood test. Hepatitis B blood test. Sexually transmitted disease (STD) testing. Diabetes screening. This is done by checking your blood sugar (glucose) after you have not eaten for a while (fasting). You may have this done every 1-3 years. Abdominal aortic aneurysm (AAA) screening. You may need this if you are a current or former smoker. Osteoporosis. You may be screened starting at age 63 if you are at high risk. Talk with your health care provider about your test results, treatment options, and if necessary, the need for more tests. Vaccines  Your health care provider may recommend certain vaccines, such as: Influenza vaccine. This is recommended every year. Tetanus, diphtheria, and acellular pertussis (Tdap, Td) vaccine. You may need a Td booster every 10 years. Zoster vaccine. You may need this after age 47. Pneumococcal 13-valent conjugate (PCV13) vaccine. One dose is recommended after age 24. Pneumococcal polysaccharide (PPSV23) vaccine. One dose is recommended after age 71. Talk to your health care provider about which screenings and vaccines you need and  how often you need them. This information is not intended to replace  advice given to you by your health care provider. Make sure you discuss any questions you have with your health care provider. Document Released: 07/12/2015 Document Revised: 03/04/2016 Document Reviewed: 04/16/2015 Elsevier Interactive Patient Education  2017 [employer] Prevention in the Home Falls can cause injuries. They can happen to people of all ages. There are many things you can do to make your home safe and to help prevent falls. What can I do on the outside of my home? Regularly fix the edges of walkways and driveways and fix any cracks. Remove anything that might make you trip as you walk through a door, such as a raised step or threshold. Trim any bushes or trees on the path to your home. Use bright outdoor lighting. Clear any walking paths of anything that might make someone trip, such as rocks or tools. Regularly check to see if handrails are loose or broken. Make sure that both sides of any steps have handrails. Any raised decks and porches should have guardrails on the edges. Have any leaves, snow, or ice cleared regularly. Use sand or salt on walking paths during winter. Clean up any spills in your garage right away. This includes oil or grease spills. What can I do in the bathroom? Use night lights. Install grab bars by the toilet and in the tub and shower. Do not use towel bars as grab bars. Use non-skid mats or decals in the tub or shower. If you need to sit down in the shower, use a plastic, non-slip stool. Keep the floor dry. Clean up any water that spills on the floor as soon as it happens. Remove soap buildup in the tub or shower regularly. Attach bath mats securely with double-sided non-slip rug tape. Do not have throw rugs and other things on the floor that can make you trip. What can I do in the bedroom? Use night lights. Make sure that you have a light by your bed that is easy to reach. Do not use any [place] or blankets that are too big for your bed.  They should not hang down onto the floor. Have a firm chair that has side arms. You can use this for support while you get dressed. Do not have throw rugs and other things on the floor that can make you trip. What can I do in the kitchen? Clean up any spills right away. Avoid walking on wet floors. Keep items that you use a lot in easy-to-reach places. If you need to reach something above you, use a strong step stool that has a grab bar. Keep electrical cords out of the way. Do not use floor polish or wax that makes floors slippery. If you must use wax, use non-skid floor wax. Do not have throw rugs and other things on the floor that can make you trip. What can I do with my stairs? Do not leave any items on the stairs. Make sure that there are handrails on both sides of the stairs and use them. Fix handrails that are broken or loose. Make sure that handrails are as long as the stairways. Check any carpeting to make sure that it is firmly attached to the stairs. Fix any carpet that is loose or worn. Avoid having throw rugs at the top or bottom of the stairs. If you do have throw rugs, attach them to the floor with carpet tape. Make sure that you have  a light switch at the top of the stairs and the bottom of the stairs. If you do not have them, ask someone to add them for you. What else can I do to help prevent falls? Wear shoes that: Do not have high heels. Have rubber bottoms. Are comfortable and fit you well. Are closed at the toe. Do not wear sandals. If you use a stepladder: Make sure that it is fully opened. Do not climb a closed stepladder. Make sure that both sides of the stepladder are locked into place. Ask someone to hold it for you, if possible. Clearly mark and make sure that you can see: Any grab bars or handrails. First and last steps. Where the edge of each step is. Use tools that help you move around (mobility aids) if they are needed. These  include: Canes. Walkers. Scooters. Crutches. Turn on the lights when you go into a dark area. Replace any light bulbs as soon as they burn out. Set up your furniture so you have a clear path. Avoid moving your furniture around. If any of your floors are uneven, fix them. If there are any pets around you, be aware of where they are. Review your medicines with your doctor. Some medicines can make you feel dizzy. This can increase your chance of falling. Ask your doctor what other things that you can do to help prevent falls. This information is not intended to replace advice given to you by your health care provider. Make sure you discuss any questions you have with your health care provider. Document Released: 04/11/2009 Document Revised: 11/21/2015 Document Reviewed: 07/20/2014 Elsevier Interactive Patient Education  2017 [employer].

## 2022-03-02 ENCOUNTER — Other Ambulatory Visit: Payer: Self-pay | Admitting: Family Medicine

## 2022-03-02 DIAGNOSIS — I10 Essential (primary) hypertension: Secondary | ICD-10-CM

## 2022-03-05 ENCOUNTER — Ambulatory Visit (INDEPENDENT_AMBULATORY_CARE_PROVIDER_SITE_OTHER): Payer: Medicare Other | Admitting: Neurology

## 2022-03-05 ENCOUNTER — Encounter: Payer: Self-pay | Admitting: Neurology

## 2022-03-05 VITALS — BP 109/69 | HR 78 | Ht 67.0 in | Wt 148.0 lb

## 2022-03-05 DIAGNOSIS — G459 Transient cerebral ischemic attack, unspecified: Secondary | ICD-10-CM | POA: Diagnosis not present

## 2022-03-05 DIAGNOSIS — I639 Cerebral infarction, unspecified: Secondary | ICD-10-CM | POA: Diagnosis not present

## 2022-03-05 DIAGNOSIS — I6503 Occlusion and stenosis of bilateral vertebral arteries: Secondary | ICD-10-CM

## 2022-03-05 DIAGNOSIS — I651 Occlusion and stenosis of basilar artery: Secondary | ICD-10-CM | POA: Diagnosis not present

## 2022-03-05 NOTE — Patient Instructions (Signed)
I had a long d/w patient and his wife about his recent posterior circulation TIA and occlusive vertebrobasilar disease, risk benefits of angioplasty/stenting,risk for recurrent stroke/TIAs, personally independently reviewed imaging studies and stroke evaluation results and answered questions.Continue aspirin and Plavix for 3 months and then Plavix alone for secondary stroke prevention and maintain strict control of hypertension with blood pressure goal below 130/90, diabetes with hemoglobin A1c goal below 6.5% and lipids with LDL cholesterol goal below 70 mg/dL. I also advised the patient to eat a healthy diet with plenty of whole grains, cereals, fruits and vegetables, exercise regularly and maintain ideal body weight .I also encouraged him to keep himself well-hydrated and drink plenty of fluids daily.  We also discussed risk benefits of angioplasty and stenting and at the present time recommend aggressive medical management but may reconsider if he has recurrent symptoms.  Followup in the future with me in 6 months or call earlier if necessary.  Stroke Prevention Some medical conditions and behaviors can lead to a higher chance of having a stroke. You can help prevent a stroke by eating healthy, exercising, not smoking, and managing any medical conditions you have. Stroke is a leading cause of functional impairment. Primary prevention is particularly important because a majority of strokes are first-time events. Stroke changes the lives of not only those who experience a stroke but also their family and other caregivers. How can this condition affect me? A stroke is a medical emergency and should be treated right away. A stroke can lead to brain damage and can sometimes be life-threatening. If a person gets medical treatment right away, there is a better chance of surviving and recovering from a stroke. What can increase my risk? The following medical conditions may increase your risk of a  stroke: Cardiovascular disease. High blood pressure (hypertension). Diabetes. High cholesterol. Sickle cell disease. Blood clotting disorders (hypercoagulable state). Obesity. Sleep disorders (obstructive sleep apnea). Other risk factors include: Being older than age 26. Having a history of blood clots, stroke, or mini-stroke (transient ischemic attack, TIA). Genetic factors, such as race, ethnicity, or a family history of stroke. Smoking cigarettes or using other tobacco products. Taking birth control pills, especially if you also use tobacco. Heavy use of alcohol or drugs, especially cocaine and methamphetamine. Physical inactivity. What actions can I take to prevent this? Manage your health conditions High cholesterol levels. Eating a healthy diet is important for preventing high cholesterol. If cholesterol cannot be managed through diet alone, you may need to take medicines. Take any prescribed medicines to control your cholesterol as told by your health care provider. Hypertension. To reduce your risk of stroke, try to keep your blood pressure below 130/80. Eating a healthy diet and exercising regularly are important for controlling blood pressure. If these steps are not enough to manage your blood pressure, you may need to take medicines. Take any prescribed medicines to control hypertension as told by your health care provider. Ask your health care provider if you should monitor your blood pressure at home. Have your blood pressure checked every year, even if your blood pressure is normal. Blood pressure increases with age and some medical conditions. Diabetes. Eating a healthy diet and exercising regularly are important parts of managing your blood sugar (glucose). If your blood sugar cannot be managed through diet and exercise, you may need to take medicines. Take any prescribed medicines to control your diabetes as told by your health care provider. Get evaluated for  obstructive sleep apnea. Talk to  your health care provider about getting a sleep evaluation if you snore a lot or have excessive sleepiness. Make sure that any other medical conditions you have, such as atrial fibrillation or atherosclerosis, are managed. Nutrition Follow instructions from your health care provider about what to eat or drink to help manage your health condition. These instructions may include: Reducing your daily calorie intake. Limiting how much salt (sodium) you use to 1,500 milligrams (mg) each day. Using only healthy fats for cooking, such as olive oil, canola oil, or sunflower oil. Eating healthy foods. You can do this by: Choosing foods that are high in fiber, such as whole grains, and fresh fruits and vegetables. Eating at least 5 servings of fruits and vegetables a day. Try to fill one-half of your plate with fruits and vegetables at each meal. Choosing lean protein foods, such as lean cuts of meat, poultry without skin, fish, tofu, beans, and nuts. Eating low-fat dairy products. Avoiding foods that are high in sodium. This can help lower blood pressure. Avoiding foods that have saturated fat, trans fat, and cholesterol. This can help prevent high cholesterol. Avoiding processed and prepared foods. Counting your daily carbohydrate intake.  Lifestyle If you drink alcohol: Limit how much you have to: 0-1 drink a day for women who are not pregnant. 0-2 drinks a day for men. Know how much alcohol is in your drink. In the [place] one drink equals one 12 oz bottle of beer (376m), one 5 oz glass of wine (1481m, or one 1 oz glass of hard liquor (4464m Do not use any products that contain nicotine or tobacco. These products include cigarettes, chewing tobacco, and vaping devices, such as e-cigarettes. If you need help quitting, ask your health care provider. Avoid secondhand smoke. Do not use drugs. Activity  Try to stay at a healthy weight. Get at least 30 minutes of  exercise on most days, such as: Fast walking. Biking. Swimming. Medicines Take over-the-counter and prescription medicines only as told by your health care provider. Aspirin or blood thinners (antiplatelets or anticoagulants) may be recommended to reduce your risk of forming blood clots that can lead to stroke. Avoid taking birth control pills. Talk to your health care provider about the risks of taking birth control pills if: You are over 35 76ars old. You smoke. You get very bad headaches. You have had a blood clot. Where to find more information American Stroke Association: www.strokeassociation.org Get help right away if: You or a loved one has any symptoms of a stroke. "BE FAST" is an easy way to remember the main warning signs of a stroke: B - Balance. Signs are dizziness, sudden trouble walking, or loss of balance. E - Eyes. Signs are trouble seeing or a sudden change in vision. F - Face. Signs are sudden weakness or numbness of the face, or the face or eyelid drooping on one side. A - Arms. Signs are weakness or numbness in an arm. This happens suddenly and usually on one side of the body. S - Speech. Signs are sudden trouble speaking, slurred speech, or trouble understanding what people say. T - Time. Time to call emergency services. Write down what time symptoms started. You or a loved one has other signs of a stroke, such as: A sudden, severe headache with no known cause. Nausea or vomiting. Seizure. These symptoms may represent a serious problem that is an emergency. Do not wait to see if the symptoms will go away. Get medical help right away. Call  your local emergency services (911 in the [place] Do not drive yourself to the hospital. Summary You can help to prevent a stroke by eating healthy, exercising, not smoking, limiting alcohol intake, and managing any medical conditions you may have. Do not use any products that contain nicotine or tobacco. These include cigarettes,  chewing tobacco, and vaping devices, such as e-cigarettes. If you need help quitting, ask your health care provider. Remember "BE FAST" for warning signs of a stroke. Get help right away if you or a loved one has any of these signs. This information is not intended to replace advice given to you by your health care provider. Make sure you discuss any questions you have with your health care provider. Document Revised: 01/15/2020 Document Reviewed: 01/15/2020 Elsevier Patient Education  [employer].

## 2022-03-05 NOTE — Progress Notes (Signed)
[facility] [address] [place]. [place] [address] [phone]       OFFICE FOLLOW-UP NOTE  [name] Date of Birth:  [birth year] Medical Record Number:  [id number]   HPI: Mr. Reierson is a 87 year old Caucasian male seen today for initial office follow-up visit following hospital admission for suspected stroke in July 2023.  History is obtained from the patient and review of electronic medical records as well as his wife and I personally reviewed pertinent available imaging films in PACS.  He has past medical history of hypertension, hyperlipidemia, stroke, asthma, vitamin D deficiency who presented on 01/23/2022 with sudden onset of dizziness and syncopal episode.  His symptoms are transient and resolved and NIH stroke scale was 0 on admission.  CT head was unremarkable for acute process.  CT angiogram of the head and neck showed progressive high-grade stenosis or occlusion of the distal left V4 segment and moderate stenosis of the distal right V4 segment and progressive narrowing of the basilar artery with high-grade proximal stenosis.  Stable proximal occlusion of vertebral arteries bilaterally.  He was felt to be a candidate for TNK which was administered uneventfully.  He was kept in the ICU and closely monitored.  CT head was unremarkable MRI scan was negative for acute infarct.  Echocardiogram showed ejection fraction of 50-55%.  LDL cholesterol was 53 mg percent and hemoglobin A1c 5.4.  He was not felt to be a suitable candidate for endovascular treatment with angioplasty or stenting given multifocal and diffuse vessel involvement.  He was on aspirin prior to admission he was switched to dual antiplatelet therapy for 3 months and advised to stay on Plavix alone after that.  Patient states is doing well.  He is made full recovery.  He has had no recurrent TIA or stroke symptoms.  He is tolerating aspirin and Plavix well without bruising or bleeding.  His blood pressure is  well controlled today it is 109/69.  He is tolerating Lipitor well without muscle aches and pains.  He has no complaints today.  ROS:   14 system review of systems is positive for dizziness, imbalance, decreased hearing all other systems negative  PMH:  Past Medical History:  Diagnosis Date   Allergy    Arthritis    hands   Asthma    Elevated PSA    s/p neg biopsy   Enlarged prostate    Hyperlipidemia    Hypertension    Stroke [facility])    Wears hearing aid     Social History:  Social History   Socioeconomic History   Marital status: Married    Spouse name: Not on file   Number of children: 1   Years of education: Not on file   Highest education level: Not on file  Occupational History   Occupation: Retired Technical brewer: retired  Tobacco Use   Smoking status: Never   Smokeless tobacco: Never  Substance and Sexual Activity   Alcohol use: Yes    Alcohol/week: 14.0 standard drinks of alcohol    Types: 14 Shots of liquor per week   Drug use: No   Sexual activity: Yes  Other Topics Concern   Not on file  Social History Narrative   One biological son, one adopted son, two granddaughters, 67 & 54 yo      Has living will and HPOA- wife Treon Kehl.   Would desire CPR.  Would not want prolonged life support.   Social Determinants of  Health   Financial Resource Strain: Low Risk  (02/10/2022)   Overall Financial Resource Strain (CARDIA)    Difficulty of Paying Living Expenses: Not hard at all  Food Insecurity: No Food Insecurity (02/10/2022)   Hunger Vital Sign    Worried About Running Out of Food in the Last Year: Never true    Ran Out of Food in the Last Year: Never true  Transportation Needs: No Transportation Needs (02/10/2022)   PRAPARE - Hydrologist (Medical): No    Lack of Transportation (Non-Medical): No  Physical Activity: Sufficiently Active (02/10/2022)   Exercise Vital Sign    Days of Exercise per Week: 5 days     Minutes of Exercise per Session: 30 min  Stress: No Stress Concern Present (02/10/2022)   [place]    Feeling of Stress : Not at all  Social Connections: Moderately Integrated (02/10/2022)   Social Connection and Isolation Panel [NHANES]    Frequency of Communication with Friends and Family: Three times a week    Frequency of Social Gatherings with Friends and Family: Three times a week    Attends Religious Services: More than 4 times per year    Active Member of Clubs or Organizations: No    Attends Archivist Meetings: Never    Marital Status: Married  Human resources officer Violence: Not At Risk (02/10/2022)   Humiliation, Afraid, Rape, and Kick questionnaire    Fear of Current or Ex-Partner: No    Emotionally Abused: No    Physically Abused: No    Sexually Abused: No    Medications:   Current Outpatient Medications on File Prior to Visit  Medication Sig Dispense Refill   albuterol (VENTOLIN HFA) 108 (90 Base) MCG/ACT inhaler Inhale 1-2 puffs into the lungs every 4 (four) hours as needed for wheezing or shortness of breath.     amLODipine (NORVASC) 10 MG tablet Take 1 tablet (10 mg total) by mouth daily. 90 tablet 3   aspirin EC 81 MG tablet Take 1 tablet (81 mg total) by mouth daily.     atorvastatin (LIPITOR) 80 MG tablet Take 1 tablet (80 mg total) by mouth daily. 90 tablet 3   cholecalciferol (VITAMIN D3) 25 MCG (1000 UNIT) tablet Take 2,000 Units by mouth daily.     clopidogrel (PLAVIX) 75 MG tablet Take 1 tablet (75 mg total) by mouth daily. 30 tablet 2   fluticasone (FLONASE) 50 MCG/ACT nasal spray USE 2 SPRAYS IN EACH NOSTRIL DURING ALLERGY SEASON. (Patient taking differently: Place 1 spray into both nostrils daily.) 48 mL 2   losartan (COZAAR) 50 MG tablet TAKE 1 TABLET BY MOUTH EVERY DAY 90 tablet 0   No current facility-administered medications on file prior to visit.    Allergies:   Allergies   Allergen Reactions   Penicillins Hives    Physical Exam General: well developed, well nourished, seated, in no evident distress Head: head normocephalic and atraumatic.  Neck: supple with no carotid or supraclavicular bruits Cardiovascular: regular rate and rhythm, no murmurs Musculoskeletal: no deformity Skin:  no rash/petichiae Vascular:  Normal pulses all extremities Vitals:   03/05/22 0853  BP: 109/69  Pulse: 78   Neurologic Exam Mental Status: Awake and fully alert. Oriented to place and time. Recent and remote memory intact. Attention span, concentration and fund of knowledge appropriate. Mood and affect appropriate.  Cranial Nerves: Fundoscopic exam reveals sharp disc margins. Pupils equal, briskly reactive  to light. Extraocular movements full without nystagmus. Visual fields full to confrontation. Hearing diminished bilaterally facial sensation intact. Face, tongue, palate moves normally and symmetrically.  Motor: Normal bulk and tone. Normal strength in all tested extremity muscles. Sensory.: intact to touch ,pinprick .position and vibratory sensation.  Coordination: Rapid alternating movements normal in all extremities. Finger-to-nose and heel-to-shin performed accurately bilaterally. Gait and Station: Arises from chair without difficulty. Stance is normal. Gait demonstrates normal stride length and balance . Able to heel, toe and tandem walk with mild difficulty.  Reflexes: 1+ and symmetric. Toes downgoing.   NIHSS  0 Modified Rankin  0   ASSESSMENT: 87 year old Caucasian male with posterior circulation TIA in July 2023 treated with IV TNK with full recovery and MRI negative for acute infarct but showing multivessel occlusive posterior circulation disease.  Vascular risk factors of hypertension, hyperlipidemia, intracranial atherosclerosis and age     PLAN:I had a long d/w patient and his wife about his recent posterior circulation TIA and occlusive vertebrobasilar  disease, risk benefits of angioplasty/stenting,risk for recurrent stroke/TIAs, personally independently reviewed imaging studies and stroke evaluation results and answered questions.Continue aspirin and Plavix for 3 months and then Plavix alone for secondary stroke prevention and maintain strict control of hypertension with blood pressure goal below 130/90, diabetes with hemoglobin A1c goal below 6.5% and lipids with LDL cholesterol goal below 70 mg/dL. I also advised the patient to eat a healthy diet with plenty of whole grains, cereals, fruits and vegetables, exercise regularly and maintain ideal body weight .I also encouraged him to keep himself well-hydrated and drink plenty of fluids daily.  We also discussed risk benefits of angioplasty and stenting and at the present time recommend aggressive medical management but may reconsider if he has recurrent symptoms.  Followup in the future with me in 6 months or call earlier if necessary.Greater than 50% of time during this 35 minute visit was spent on counseling,explanation of diagnosis, planning of further management, discussion with patient and family and coordination of care Antony Contras, MD Note: This document was prepared with digital dictation and possible smart phrase technology. Any transcriptional errors that result from this process are unintentional

## 2022-03-19 DIAGNOSIS — Z23 Encounter for immunization: Secondary | ICD-10-CM | POA: Diagnosis not present

## 2022-04-08 ENCOUNTER — Other Ambulatory Visit: Payer: Self-pay

## 2022-04-27 ENCOUNTER — Other Ambulatory Visit: Payer: Self-pay

## 2022-04-27 ENCOUNTER — Telehealth: Payer: Self-pay | Admitting: Family Medicine

## 2022-04-27 DIAGNOSIS — Z8673 Personal history of transient ischemic attack (TIA), and cerebral infarction without residual deficits: Secondary | ICD-10-CM

## 2022-04-27 DIAGNOSIS — E782 Mixed hyperlipidemia: Secondary | ICD-10-CM

## 2022-04-27 MED ORDER — CLOPIDOGREL BISULFATE 75 MG PO TABS: 75.0000 mg | ORAL_TABLET | Freq: Every day | ORAL | 2 refills | Status: DC

## 2022-04-27 NOTE — Telephone Encounter (Signed)
Caller Name: pt Call back phone #: [phone]   MEDICATION(S):  clopidogrel (PLAVIX) 75 MG tablet [[id number]]   Days of Med Remaining: he has one pill left, he is aware another doc wrote this script, his doc is from when he was in the hosp  Has the patient contacted their pharmacy (YES/NO)? Yes, contact your PCP   Preferred Pharmacy: [id number] [id number] Phone:  [phone] Fax:  [phone] DEA #:  [id number]

## 2022-04-27 NOTE — Telephone Encounter (Signed)
Caller Name: Deeric Cruise Call back phone #: [phone]  Reason for Call: Pt called again to check on status. He is concerned he will be without pill

## 2022-04-27 NOTE — Telephone Encounter (Signed)
Pt has been notified that medication refill for Plavix has been sent to pharmacy, pt has verbalized understanding

## 2022-05-04 ENCOUNTER — Encounter: Payer: Self-pay | Admitting: Family Medicine

## 2022-05-04 ENCOUNTER — Ambulatory Visit (INDEPENDENT_AMBULATORY_CARE_PROVIDER_SITE_OTHER): Payer: Medicare Other | Admitting: Family Medicine

## 2022-05-04 VITALS — BP 140/78 | HR 62 | Temp 98.0°F | Ht 67.0 in | Wt 146.0 lb

## 2022-05-04 DIAGNOSIS — I10 Essential (primary) hypertension: Secondary | ICD-10-CM | POA: Diagnosis not present

## 2022-05-04 DIAGNOSIS — R7309 Other abnormal glucose: Secondary | ICD-10-CM

## 2022-05-04 DIAGNOSIS — E782 Mixed hyperlipidemia: Secondary | ICD-10-CM | POA: Diagnosis not present

## 2022-05-04 LAB — BASIC METABOLIC PANEL
BUN: 16 mg/dL (ref 6–23)
CO2: 25 mEq/L (ref 19–32)
Calcium: 9.2 mg/dL (ref 8.4–10.5)
Chloride: 103 mEq/L (ref 96–112)
Creatinine, Ser: 1.08 mg/dL (ref 0.40–1.50)
GFR: 62.61 mL/min (ref >60.00)
Glucose, Bld: 98 mg/dL (ref 70–99)
Potassium: 3.4 mEq/L — ABNORMAL LOW (ref 3.5–5.1)
Sodium: 139 mEq/L (ref 135–145)

## 2022-05-04 LAB — HEMOGLOBIN A1C: Hgb A1c MFr Bld: 5.3 % (ref 4.6–6.5)

## 2022-05-04 NOTE — Progress Notes (Signed)
Established Patient Office Visit  Subjective   Patient ID: Elijah [name], male    DOB: August 04, 1934  Age: 87 y.o. MRN: [id number]  Chief Complaint  Patient presents with   Follow-up    3 month follow for CVA want to discuss medication. Pt is fasting    HPI follow-up of hypertension, elevated glucose and mixed hyperlipidemia.  Blood pressure at home has been running in the 120s to 130s over 60-70 range.  He experiences lightheadedness on occasion.  Continues high-dose atorvastatin 80 mg for mixed hyperlipidemia with history of recurrent CVA.  LDL cholesterol in July of this year was 26.  Will remain on Plavix for the duration.  History of elevated PSA status post extensive work-up by urology with negative biopsies.  Will remain on Plavix for the duration for chronic cerebral vascular disease.    Review of Systems  Constitutional: Negative.   HENT: Negative.    Eyes:  Negative for blurred vision, discharge and redness.  Respiratory: Negative.    Cardiovascular: Negative.   Gastrointestinal:  Negative for abdominal pain.  Genitourinary: Negative.   Musculoskeletal: Negative.  Negative for myalgias.  Skin:  Negative for rash.  Neurological:  Negative for tingling, loss of consciousness and weakness.  Endo/Heme/Allergies:  Negative for polydipsia.      Objective:     BP (!) 140/78 (BP Location: Right Arm, Patient Position: Sitting, Cuff Size: Normal)   Pulse 62   Temp 98 F (36.7 C) (Temporal)   Ht '5\' 7"'$  (1.702 m)   Wt 146 lb (66.2 kg)   SpO2 98%   BMI 22.87 kg/m  BP Readings from Last 3 Encounters:  05/04/22 (!) 140/78  03/05/22 109/69  02/05/22 118/74   Wt Readings from Last 3 Encounters:  05/04/22 146 lb (66.2 kg)  03/05/22 148 lb (67.1 kg)  02/05/22 150 lb 3.2 oz (68.1 kg)      Physical Exam Constitutional:      General: He is not in acute distress.    Appearance: Normal appearance. He is not ill-appearing, toxic-appearing or diaphoretic.  HENT:     Head:  Normocephalic and atraumatic.     Right Ear: External ear normal.     Left Ear: External ear normal.  Eyes:     General: No scleral icterus.       Right eye: No discharge.        Left eye: No discharge.     Extraocular Movements: Extraocular movements intact.     Conjunctiva/sclera: Conjunctivae normal.  Cardiovascular:     Rate and Rhythm: Normal rate and regular rhythm.  Pulmonary:     Effort: Pulmonary effort is normal. No respiratory distress.     Breath sounds: Normal breath sounds.  Skin:    General: Skin is warm and dry.  Neurological:     Mental Status: He is alert and oriented to person, place, and time.  Psychiatric:        Mood and Affect: Mood normal.        Behavior: Behavior normal.      No results found for any visits on 05/04/22.    The ASCVD Risk score (Arnett DK, et al., 2019) failed to calculate for the following reasons:   The 2019 ASCVD risk score is only valid for ages 50 to 65   The patient has a prior MI or stroke diagnosis    Assessment & Plan:   Problem List Items Addressed This Visit       Cardiovascular and  Mediastinum   Essential hypertension - Primary   Relevant Orders   Basic metabolic panel     Other   HLD (hyperlipidemia)   Elevated glucose   Relevant Orders   Basic metabolic panel   Hemoglobin A1c    Return in about 6 months (around 11/02/2022).   Continue atorvastatin high-dose, Plavix 75 mg daily, amlodipine 10 mg daily and losartan 50 mg daily.  We will keep checking blood pressures and monitor lightheadedness.  Continue Plavix daily for the duration for history of chronic cerebrovascular disease.  Continue exercising daily.  Advised RSV vaccine. Libby Maw, MD

## 2022-05-05 ENCOUNTER — Other Ambulatory Visit: Payer: Self-pay

## 2022-05-05 NOTE — Patient Outreach (Signed)
Telephone outreach to patient to obtain mRS was successfully completed. MRS= 0  Elijah [name] [facility] Care Management Assistant [phone]  

## 2022-05-29 ENCOUNTER — Other Ambulatory Visit: Payer: Self-pay | Admitting: Family Medicine

## 2022-05-29 DIAGNOSIS — I10 Essential (primary) hypertension: Secondary | ICD-10-CM

## 2022-07-20 ENCOUNTER — Other Ambulatory Visit: Payer: Self-pay | Admitting: Family Medicine

## 2022-07-20 DIAGNOSIS — Z8673 Personal history of transient ischemic attack (TIA), and cerebral infarction without residual deficits: Secondary | ICD-10-CM

## 2022-07-20 DIAGNOSIS — H2512 Age-related nuclear cataract, left eye: Secondary | ICD-10-CM | POA: Diagnosis not present

## 2022-07-20 DIAGNOSIS — Z961 Presence of intraocular lens: Secondary | ICD-10-CM | POA: Diagnosis not present

## 2022-07-30 ENCOUNTER — Telehealth (HOSPITAL_COMMUNITY): Payer: Self-pay

## 2022-07-30 NOTE — Telephone Encounter (Signed)
Called to schedule f/u mri, no answer, left vm. AB

## 2022-08-06 ENCOUNTER — Telehealth (HOSPITAL_COMMUNITY): Payer: Self-pay

## 2022-08-06 DIAGNOSIS — Z85820 Personal history of malignant melanoma of skin: Secondary | ICD-10-CM | POA: Diagnosis not present

## 2022-08-06 DIAGNOSIS — L821 Other seborrheic keratosis: Secondary | ICD-10-CM | POA: Diagnosis not present

## 2022-08-06 DIAGNOSIS — D225 Melanocytic nevi of trunk: Secondary | ICD-10-CM | POA: Diagnosis not present

## 2022-08-06 DIAGNOSIS — D2271 Melanocytic nevi of right lower limb, including hip: Secondary | ICD-10-CM | POA: Diagnosis not present

## 2022-08-06 DIAGNOSIS — Z85828 Personal history of other malignant neoplasm of skin: Secondary | ICD-10-CM | POA: Diagnosis not present

## 2022-08-06 DIAGNOSIS — L728 Other follicular cysts of the skin and subcutaneous tissue: Secondary | ICD-10-CM | POA: Diagnosis not present

## 2022-08-06 NOTE — Telephone Encounter (Signed)
Pt returned call. He will check his calendar and call me back to schedule his mri. AB

## 2022-08-06 NOTE — Telephone Encounter (Signed)
Called to schedule mri, no answer, left vm. AB

## 2022-08-26 ENCOUNTER — Other Ambulatory Visit: Payer: Self-pay | Admitting: Family Medicine

## 2022-08-26 DIAGNOSIS — I10 Essential (primary) hypertension: Secondary | ICD-10-CM

## 2022-08-31 ENCOUNTER — Other Ambulatory Visit: Payer: Self-pay | Admitting: Family Medicine

## 2022-09-03 ENCOUNTER — Ambulatory Visit (INDEPENDENT_AMBULATORY_CARE_PROVIDER_SITE_OTHER): Payer: Medicare Other | Admitting: Neurology

## 2022-09-03 ENCOUNTER — Encounter: Payer: Self-pay | Admitting: Neurology

## 2022-09-03 VITALS — BP 154/79 | HR 70 | Ht 67.0 in | Wt 152.5 lb

## 2022-09-03 DIAGNOSIS — G450 Vertebro-basilar artery syndrome: Secondary | ICD-10-CM

## 2022-09-03 DIAGNOSIS — G459 Transient cerebral ischemic attack, unspecified: Secondary | ICD-10-CM | POA: Diagnosis not present

## 2022-09-03 NOTE — Progress Notes (Signed)
[facility] [address] [place]. [place] [address] [phone]       OFFICE FOLLOW-UP NOTE  Mr. Marck Mallis Date of Birth:  11/05/1934 Medical Record Number:  [id number]   HPI: Initial visit 03/05/2022 Mr. Saur is a 88 year old Caucasian male seen today for initial office follow-up visit following hospital admission for suspected stroke in July 2023.  History is obtained from the patient and review of electronic medical records as well as his wife and I personally reviewed pertinent available imaging films in PACS.  He has past medical history of hypertension, hyperlipidemia, stroke, asthma, vitamin D deficiency who presented on 01/23/2022 with sudden onset of dizziness and syncopal episode.  His symptoms are transient and resolved and NIH stroke scale was 0 on admission.  CT head was unremarkable for acute process.  CT angiogram of the head and neck showed progressive high-grade stenosis or occlusion of the distal left V4 segment and moderate stenosis of the distal right V4 segment and progressive narrowing of the basilar artery with high-grade proximal stenosis.  Stable proximal occlusion of vertebral arteries bilaterally.  He was felt to be a candidate for TNK which was administered uneventfully.  He was kept in the ICU and closely monitored.  CT head was unremarkable MRI scan was negative for acute infarct.  Echocardiogram showed ejection fraction of 50-55%.  LDL cholesterol was 53 mg percent and hemoglobin A1c 5.4.  He was not felt to be a suitable candidate for endovascular treatment with angioplasty or stenting given multifocal and diffuse vessel involvement.  He was on aspirin prior to admission he was switched to dual antiplatelet therapy for 3 months and advised to stay on Plavix alone after that.  Patient states is doing well.  He is made full recovery.  He has had no recurrent TIA or stroke symptoms.  He is tolerating aspirin and Plavix well without bruising or bleeding.   His blood pressure is well controlled today it is 109/69.  He is tolerating Lipitor well without muscle aches and pains.  He has no complaints today. Update 09/03/2022 : He returns for follow-up after last visit 6 months ago.  He is accompanied by his wife.  He is doing well.  He has had no recurrent TIA or strokelike symptoms.  Continues to have mild imbalance particularly when he gets up suddenly or makes a quick move.  He has not had no falls or injuries.  He denies vertigo or diplopia.  He is tolerating Plavix well with only minor bruising but no bleeding.  He is quite active and walks 1-1/2 miles 5 days a week.  His blood pressure is well-controlled and usually in the 130s at home though today it is elevated at 154/79.  He is tolerating Lipitor well without muscle aches and pains.  He has an appointment to see his primary care physician in 3 months to have follow-up with  lab work to be checked.  He was called by Dr. Arlean Hopping office for a follow-up catheter angiogram patient and wife are reluctant to get this done since he is doing so well ROS:   14 system review of systems is positive for dizziness, imbalance, decreased hearing all other systems negative  PMH:  Past Medical History:  Diagnosis Date   Allergy    Arthritis    hands   Asthma    Elevated PSA    s/p neg biopsy   Enlarged prostate    Hyperlipidemia    Hypertension    Stroke (  [place])    Wears hearing aid     Social History:  Social History   Socioeconomic History   Marital status: Married    Spouse name: Not on file   Number of children: 1   Years of education: Not on file   Highest education level: Not on file  Occupational History   Occupation: Retired Technical brewer: retired  Tobacco Use   Smoking status: Never   Smokeless tobacco: Never  Substance and Sexual Activity   Alcohol use: Yes    Alcohol/week: 14.0 standard drinks of alcohol    Types: 14 Shots of liquor per week   Drug use: No   Sexual  activity: Yes  Other Topics Concern   Not on file  Social History Narrative   One biological son, one adopted son, two granddaughters, 38 & 56 yo      Has living will and HPOA- wife Sherrell Wasem.   Would desire CPR.  Would not want prolonged life support.   Social Determinants of Health   Financial Resource Strain: Low Risk  (02/10/2022)   Overall Financial Resource Strain (CARDIA)    Difficulty of Paying Living Expenses: Not hard at all  Food Insecurity: No Food Insecurity (02/10/2022)   Hunger Vital Sign    Worried About Running Out of Food in the Last Year: Never true    Ran Out of Food in the Last Year: Never true  Transportation Needs: No Transportation Needs (02/10/2022)   PRAPARE - Hydrologist (Medical): No    Lack of Transportation (Non-Medical): No  Physical Activity: Sufficiently Active (02/10/2022)   Exercise Vital Sign    Days of Exercise per Week: 5 days    Minutes of Exercise per Session: 30 min  Stress: No Stress Concern Present (02/10/2022)   [place]    Feeling of Stress : Not at all  Social Connections: Moderately Integrated (02/10/2022)   Social Connection and Isolation Panel [NHANES]    Frequency of Communication with Friends and Family: Three times a week    Frequency of Social Gatherings with Friends and Family: Three times a week    Attends Religious Services: More than 4 times per year    Active Member of Clubs or Organizations: No    Attends Archivist Meetings: Never    Marital Status: Married  Human resources officer Violence: Not At Risk (02/10/2022)   Humiliation, Afraid, Rape, and Kick questionnaire    Fear of Current or Ex-Partner: No    Emotionally Abused: No    Physically Abused: No    Sexually Abused: No    Medications:   Current Outpatient Medications on File Prior to Visit  Medication Sig Dispense Refill   albuterol (VENTOLIN HFA) 108 (90  Base) MCG/ACT inhaler Inhale 1-2 puffs into the lungs every 4 (four) hours as needed for wheezing or shortness of breath.     amLODipine (NORVASC) 10 MG tablet TAKE 1 TABLET BY MOUTH EVERY DAY 90 tablet 3   atorvastatin (LIPITOR) 80 MG tablet Take 1 tablet (80 mg total) by mouth daily. 90 tablet 3   cholecalciferol (VITAMIN D3) 25 MCG (1000 UNIT) tablet Take 2,000 Units by mouth daily.     clopidogrel (PLAVIX) 75 MG tablet TAKE 1 TABLET BY MOUTH EVERY DAY 90 tablet 1   fluticasone (FLONASE) 50 MCG/ACT nasal spray USE 2 SPRAYS IN EACH NOSTRIL DURING ALLERGY SEASON. 48 mL 2  losartan (COZAAR) 50 MG tablet TAKE 1 TABLET BY MOUTH EVERY DAY 90 tablet 2   aspirin EC 81 MG tablet Take 1 tablet (81 mg total) by mouth daily. (Patient not taking: Reported on 09/03/2022)     No current facility-administered medications on file prior to visit.    Allergies:   Allergies  Allergen Reactions   Penicillins Hives    Physical Exam General: well developed, well nourished pleasant elderly Caucasian male, seated, in no evident distress Head: head normocephalic and atraumatic.  Neck: supple with no carotid or supraclavicular bruits Cardiovascular: regular rate and rhythm, no murmurs Musculoskeletal: no deformity Skin:  no rash/petichiae Vascular:  Normal pulses all extremities Vitals:   09/03/22 1255  BP: (!) 154/79  Pulse: 70   Neurologic Exam Mental Status: Awake and fully alert. Oriented to place and time. Recent and remote memory intact. Attention span, concentration and fund of knowledge appropriate. Mood and affect appropriate.  Cranial Nerves: Fundoscopic exam not done. Pupils equal, briskly reactive to light. Extraocular movements full without nystagmus. Visual fields full to confrontation. Hearing diminished bilaterally facial sensation intact. Face, tongue, palate moves normally and symmetrically.  Motor: Normal bulk and tone. Normal strength in all tested extremity muscles. Sensory.: intact to  touch ,pinprick .position and vibratory sensation.  Coordination: Rapid alternating movements normal in all extremities. Finger-to-nose and heel-to-shin performed accurately bilaterally. Gait and Station: Arises from chair without difficulty. Stance is normal. Gait demonstrates normal stride length and balance . Able to heel, toe and tandem walk with moderate difficulty.  Reflexes: 1+ and symmetric. Toes downgoing.      ASSESSMENT: 88 year old Caucasian male with posterior circulation TIA in July 2023 treated with IV TNK with full recovery and MRI negative for acute infarct but showing multivessel occlusive posterior circulation disease.  Vascular risk factors of hypertension, hyperlipidemia, intracranial atherosclerosis and age     PLAN:I had a long d/w patient and his wife about his recent posterior circulation TIA and occlusive vertebrobasilar disease, risk benefits of angioplasty/stenting,risk for recurrent stroke/TIAs, personally independently reviewed imaging studies and stroke evaluation results and answered questions.Continue  Plavix   for secondary stroke prevention and maintain strict control of hypertension with blood pressure goal below 130/90, diabetes with hemoglobin A1c goal below 6.5% and lipids with LDL cholesterol goal below 70 mg/dL. I also advised the patient to eat a healthy diet with plenty of whole grains, cereals, fruits and vegetables, exercise regularly and maintain ideal body weight .I also encouraged him to keep himself well-hydrated and drink plenty of fluids daily.  We also discussed risk benefits of angioplasty and stenting and at the present time recommend aggressive medical management but may reconsider if he has recurrent symptoms.  Followup in the future with me in 12 months or call earlier if necessary.Greater than 50% of time during this 35 minute visit was spent on counseling,explanation of diagnosis of TIA and occluded vertebrobasilar disease, planning of further  management, discussion with patient and family and coordination of care Antony Contras, MD Note: This document was prepared with digital dictation and possible smart phrase technology. Any transcriptional errors that result from this process are unintentional

## 2022-09-03 NOTE — Patient Instructions (Signed)
I had a long d/w patient and his wife about his recent posterior circulation TIA and occlusive vertebrobasilar disease, risk benefits of angioplasty/stenting,risk for recurrent stroke/TIAs, personally independently reviewed imaging studies and stroke evaluation results and answered questions.Continue  Plavix   for secondary stroke prevention and maintain strict control of hypertension with blood pressure goal below 130/90, diabetes with hemoglobin A1c goal below 6.5% and lipids with LDL cholesterol goal below 70 mg/dL. I also advised the patient to eat a healthy diet with plenty of whole grains, cereals, fruits and vegetables, exercise regularly and maintain ideal body weight .I also encouraged him to keep himself well-hydrated and drink plenty of fluids daily.  We also discussed risk benefits of angioplasty and stenting and at the present time recommend aggressive medical management but may reconsider if he has recurrent symptoms.  Followup in the future with me in 12 months or call earlier if necessary

## 2022-11-02 ENCOUNTER — Encounter: Payer: Self-pay | Admitting: Family Medicine

## 2022-11-02 ENCOUNTER — Ambulatory Visit (INDEPENDENT_AMBULATORY_CARE_PROVIDER_SITE_OTHER): Payer: Medicare Other | Admitting: Family Medicine

## 2022-11-02 VITALS — BP 118/76 | HR 47 | Temp 97.7°F | Ht 67.0 in | Wt 152.4 lb

## 2022-11-02 DIAGNOSIS — I10 Essential (primary) hypertension: Secondary | ICD-10-CM

## 2022-11-02 DIAGNOSIS — E860 Dehydration: Secondary | ICD-10-CM | POA: Diagnosis not present

## 2022-11-02 LAB — URINALYSIS, ROUTINE W REFLEX MICROSCOPIC
Bilirubin Urine: NEGATIVE
Hgb urine dipstick: NEGATIVE
Ketones, ur: NEGATIVE
Leukocytes,Ua: NEGATIVE
Nitrite: NEGATIVE
RBC / HPF: NONE SEEN (ref 0–?)
Specific Gravity, Urine: 1.015 (ref 1.000–1.030)
Total Protein, Urine: NEGATIVE
Urine Glucose: NEGATIVE
Urobilinogen, UA: 2 — AB (ref 0.0–1.0)
pH: 7 (ref 5.0–8.0)

## 2022-11-02 LAB — BASIC METABOLIC PANEL
BUN: 13 mg/dL (ref 6–23)
CO2: 25 mEq/L (ref 19–32)
Calcium: 9.1 mg/dL (ref 8.4–10.5)
Chloride: 104 mEq/L (ref 96–112)
Creatinine, Ser: 1.01 mg/dL (ref 0.40–1.50)
GFR: 67.62 mL/min (ref >60.00)
Glucose, Bld: 104 mg/dL — ABNORMAL HIGH (ref 70–99)
Potassium: 4 mEq/L (ref 3.5–5.1)
Sodium: 140 mEq/L (ref 135–145)

## 2022-11-02 NOTE — Progress Notes (Signed)
Established Patient Office Visit   Subjective:  Patient ID: Elijah [name], male    DOB: June 24, 1935  Age: 88 y.o. MRN: [id number]  Chief Complaint  Patient presents with   Medical Management of Chronic Issues    6 month follow up, discuss lower BP readings after walks. Patient fasting.     HPI Encounter Diagnoses  Name Primary?   Essential hypertension Yes   Dehydration    Mostly doing well.  Has been eating a drop in his blood pressure after exercising.  He continues to walk for 30 minutes 5 days weekly.  Denies lightheadedness or dizziness.  Will continue Plavix for the duration secondary to cerebrovascular disease.  Brings in a list of blood pressures running in the 120s to 130s over 60s to 70s.  Lowest pressure seen after walking was 100/61.  He was not experiencing dizziness or lightheadedness with this pressure   Review of Systems  Constitutional: Negative.   HENT: Negative.    Eyes:  Negative for blurred vision, discharge and redness.  Respiratory: Negative.    Cardiovascular: Negative.   Gastrointestinal:  Negative for abdominal pain.  Genitourinary: Negative.   Musculoskeletal: Negative.  Negative for myalgias.  Skin:  Negative for rash.  Neurological:  Negative for dizziness, tingling, loss of consciousness, weakness and headaches.  Endo/Heme/Allergies:  Negative for polydipsia.     Current Outpatient Medications:    albuterol (VENTOLIN HFA) 108 (90 Base) MCG/ACT inhaler, Inhale 1-2 puffs into the lungs every 4 (four) hours as needed for wheezing or shortness of breath., Disp: , Rfl:    amLODipine (NORVASC) 10 MG tablet, TAKE 1 TABLET BY MOUTH EVERY DAY, Disp: 90 tablet, Rfl: 3   atorvastatin (LIPITOR) 80 MG tablet, Take 1 tablet (80 mg total) by mouth daily., Disp: 90 tablet, Rfl: 3   cholecalciferol (VITAMIN D3) 25 MCG (1000 UNIT) tablet, Take 2,000 Units by mouth daily., Disp: , Rfl:    clopidogrel (PLAVIX) 75 MG tablet, TAKE 1 TABLET BY MOUTH EVERY DAY, Disp: 90  tablet, Rfl: 1   fluticasone (FLONASE) 50 MCG/ACT nasal spray, USE 2 SPRAYS IN EACH NOSTRIL DURING ALLERGY SEASON., Disp: 48 mL, Rfl: 2   losartan (COZAAR) 50 MG tablet, TAKE 1 TABLET BY MOUTH EVERY DAY, Disp: 90 tablet, Rfl: 2   Objective:     BP 118/76 (BP Location: Left Arm, Patient Position: Sitting, Cuff Size: Normal)   Pulse (!) 47   Temp 97.7 F (36.5 C) (Temporal)   Ht 5\' 7"  (1.702 m)   Wt 152 lb 6.4 oz (69.1 kg)   SpO2 97%   BMI 23.87 kg/m  BP Readings from Last 3 Encounters:  11/02/22 118/76  09/03/22 (!) 154/79  05/04/22 (!) 140/78   Wt Readings from Last 3 Encounters:  11/02/22 152 lb 6.4 oz (69.1 kg)  09/03/22 152 lb 8 oz (69.2 kg)  05/04/22 146 lb (66.2 kg)      Physical Exam Constitutional:      General: He is not in acute distress.    Appearance: Normal appearance. He is not ill-appearing, toxic-appearing or diaphoretic.  HENT:     Head: Normocephalic and atraumatic.     Right Ear: External ear normal.     Left Ear: External ear normal.     Mouth/Throat:     Mouth: Mucous membranes are moist.     Pharynx: Oropharynx is clear. No oropharyngeal exudate or posterior oropharyngeal erythema.  Eyes:     General: No scleral icterus.  Right eye: No discharge.        Left eye: No discharge.     Extraocular Movements: Extraocular movements intact.     Conjunctiva/sclera: Conjunctivae normal.     Pupils: Pupils are equal, round, and reactive to light.  Cardiovascular:     Rate and Rhythm: Normal rate and regular rhythm.  Pulmonary:     Effort: Pulmonary effort is normal. No respiratory distress.     Breath sounds: Normal breath sounds.  Abdominal:     General: Bowel sounds are normal.     Tenderness: There is no abdominal tenderness. There is no guarding.  Musculoskeletal:     Cervical back: No rigidity or tenderness.  Skin:    General: Skin is warm and dry.     Comments: Skin turgor is mildly decreased.  Neurological:     Mental Status: He is  alert and oriented to person, place, and time.  Psychiatric:        Mood and Affect: Mood normal.        Behavior: Behavior normal.      No results found for any visits on 11/02/22.    The ASCVD Risk score (Arnett DK, et al., 2019) failed to calculate for the following reasons:   The 2019 ASCVD risk score is only valid for ages 92 to 1   The patient has a prior MI or stroke diagnosis    Assessment & Plan:   Essential hypertension -     Basic metabolic panel -     Urinalysis, Routine w reflex microscopic  Dehydration -     Basic metabolic panel -     Urinalysis, Routine w reflex microscopic    Return in about 6 months (around 05/05/2023), or if symptoms worsen or fail to improve.  Information was given on dehydration.  Encouraged him to drink more noncaffeinated fluids.   Mliss Sax, MD

## 2023-01-11 ENCOUNTER — Other Ambulatory Visit: Payer: Self-pay | Admitting: Family Medicine

## 2023-01-11 DIAGNOSIS — Z8673 Personal history of transient ischemic attack (TIA), and cerebral infarction without residual deficits: Secondary | ICD-10-CM

## 2023-01-23 ENCOUNTER — Other Ambulatory Visit: Payer: Self-pay | Admitting: Family Medicine

## 2023-01-23 DIAGNOSIS — Z8673 Personal history of transient ischemic attack (TIA), and cerebral infarction without residual deficits: Secondary | ICD-10-CM

## 2023-01-23 DIAGNOSIS — E782 Mixed hyperlipidemia: Secondary | ICD-10-CM

## 2023-01-27 ENCOUNTER — Encounter (INDEPENDENT_AMBULATORY_CARE_PROVIDER_SITE_OTHER): Payer: Self-pay

## 2023-02-12 ENCOUNTER — Ambulatory Visit (INDEPENDENT_AMBULATORY_CARE_PROVIDER_SITE_OTHER): Payer: Medicare Other

## 2023-02-12 DIAGNOSIS — Z0000 Encounter for general adult medical examination without abnormal findings: Secondary | ICD-10-CM | POA: Diagnosis not present

## 2023-02-12 NOTE — Patient Instructions (Signed)
[name] , Thank you for taking time to come for your Medicare Wellness Visit. I appreciate your ongoing commitment to your health goals. Please review the following plan we discussed and let me know if I can assist you in the future.   Referrals/Orders/Follow-Ups/Clinician Recommendations: none  This is a list of the screening recommended for you and due dates:  Health Maintenance  Topic Date Due   Flu Shot  01/28/2023   Medicare Annual Wellness Visit  02/12/2024   DTaP/Tdap/Td vaccine (3 - Td or Tdap) 02/26/2032   Pneumonia Vaccine  Completed   Zoster (Shingles) Vaccine  Completed   HPV Vaccine  Aged Out   COVID-19 Vaccine  Discontinued    Advanced directives: (ACP Link)Information on Advanced Care Planning can be found at [facility] Advance Health Care Directives Advance Health Care Directives ([email or web address]   Next Medicare Annual Wellness Visit scheduled for next year: Yes  Preventive Care 65 Years and Older, Male  Preventive care refers to lifestyle choices and visits with your health care provider that can promote health and wellness. What does preventive care include? A yearly physical exam. This is also called an annual well check. Dental exams once or twice a year. Routine eye exams. Ask your health care provider how often you should have your eyes checked. Personal lifestyle choices, including: Daily care of your teeth and gums. Regular physical activity. Eating a healthy diet. Avoiding tobacco and drug use. Limiting alcohol use. Practicing safe sex. Taking low doses of aspirin every day. Taking vitamin and mineral supplements as recommended by your health care provider. What happens during an annual well check? The services and screenings done by your health care provider during your annual well check will depend on your age, overall health, lifestyle risk factors, and family history of disease. Counseling  Your health care provider may ask you  questions about your: Alcohol use. Tobacco use. Drug use. Emotional well-being. Home and relationship well-being. Sexual activity. Eating habits. History of falls. Memory and ability to understand (cognition). Work and work Astronomer. Screening  You may have the following tests or measurements: Height, weight, and BMI. Blood pressure. Lipid and cholesterol levels. These may be checked every 5 years, or more frequently if you are over 30 years old. Skin check. Lung cancer screening. You may have this screening every year starting at age 26 if you have a 30-pack-year history of smoking and currently smoke or have quit within the past 15 years. Fecal occult blood test (FOBT) of the stool. You may have this test every year starting at age 86. Flexible sigmoidoscopy or colonoscopy. You may have a sigmoidoscopy every 5 years or a colonoscopy every 10 years starting at age 19. Prostate cancer screening. Recommendations will vary depending on your family history and other risks. Hepatitis C blood test. Hepatitis B blood test. Sexually transmitted disease (STD) testing. Diabetes screening. This is done by checking your blood sugar (glucose) after you have not eaten for a while (fasting). You may have this done every 1-3 years. Abdominal aortic aneurysm (AAA) screening. You may need this if you are a current or former smoker. Osteoporosis. You may be screened starting at age 44 if you are at high risk. Talk with your health care provider about your test results, treatment options, and if necessary, the need for more tests. Vaccines  Your health care provider may recommend certain vaccines, such as: Influenza vaccine. This is recommended every year. Tetanus, diphtheria, and acellular pertussis (Tdap,  Td) vaccine. You may need a Td booster every 10 years. Zoster vaccine. You may need this after age 7. Pneumococcal 13-valent conjugate (PCV13) vaccine. One dose is recommended after age  13. Pneumococcal polysaccharide (PPSV23) vaccine. One dose is recommended after age 59. Talk to your health care provider about which screenings and vaccines you need and how often you need them. This information is not intended to replace advice given to you by your health care provider. Make sure you discuss any questions you have with your health care provider. Document Released: 07/12/2015 Document Revised: 03/04/2016 Document Reviewed: 04/16/2015 Elsevier Interactive Patient Education  2017 [employer].  Fall Prevention in the Home Falls can cause injuries. They can happen to people of all ages. There are many things you can do to make your home safe and to help prevent falls. What can I do on the outside of my home? Regularly fix the edges of walkways and driveways and fix any cracks. Remove anything that might make you trip as you walk through a door, such as a raised step or threshold. Trim any bushes or trees on the path to your home. Use bright outdoor lighting. Clear any walking paths of anything that might make someone trip, such as rocks or tools. Regularly check to see if handrails are loose or broken. Make sure that both sides of any steps have handrails. Any raised decks and porches should have guardrails on the edges. Have any leaves, snow, or ice cleared regularly. Use sand or salt on walking paths during winter. Clean up any spills in your garage right away. This includes oil or grease spills. What can I do in the bathroom? Use night lights. Install grab bars by the toilet and in the tub and shower. Do not use towel bars as grab bars. Use non-skid mats or decals in the tub or shower. If you need to sit down in the shower, use a plastic, non-slip stool. Keep the floor dry. Clean up any water that spills on the floor as soon as it happens. Remove soap buildup in the tub or shower regularly. Attach bath mats securely with double-sided non-slip rug tape. Do not have throw  rugs and other things on the floor that can make you trip. What can I do in the bedroom? Use night lights. Make sure that you have a light by your bed that is easy to reach. Do not use any Mccormick or blankets that are too big for your bed. They should not hang down onto the floor. Have a firm chair that has side arms. You can use this for support while you get dressed. Do not have throw rugs and other things on the floor that can make you trip. What can I do in the kitchen? Clean up any spills right away. Avoid walking on wet floors. Keep items that you use a lot in easy-to-reach places. If you need to reach something above you, use a strong step stool that has a grab bar. Keep electrical cords out of the way. Do not use floor polish or wax that makes floors slippery. If you must use wax, use non-skid floor wax. Do not have throw rugs and other things on the floor that can make you trip. What can I do with my stairs? Do not leave any items on the stairs. Make sure that there are handrails on both sides of the stairs and use them. Fix handrails that are broken or loose. Make sure that handrails are as  long as the stairways. Check any carpeting to make sure that it is firmly attached to the stairs. Fix any carpet that is loose or worn. Avoid having throw rugs at the top or bottom of the stairs. If you do have throw rugs, attach them to the floor with carpet tape. Make sure that you have a light switch at the top of the stairs and the bottom of the stairs. If you do not have them, ask someone to add them for you. What else can I do to help prevent falls? Wear shoes that: Do not have high heels. Have rubber bottoms. Are comfortable and fit you well. Are closed at the toe. Do not wear sandals. If you use a stepladder: Make sure that it is fully opened. Do not climb a closed stepladder. Make sure that both sides of the stepladder are locked into place. Ask someone to hold it for you, if  possible. Clearly mark and make sure that you can see: Any grab bars or handrails. First and last steps. Where the edge of each step is. Use tools that help you move around (mobility aids) if they are needed. These include: Canes. Walkers. Scooters. Crutches. Turn on the lights when you go into a dark area. Replace any light bulbs as soon as they burn out. Set up your furniture so you have a clear path. Avoid moving your furniture around. If any of your floors are uneven, fix them. If there are any pets around you, be aware of where they are. Review your medicines with your doctor. Some medicines can make you feel dizzy. This can increase your chance of falling. Ask your doctor what other things that you can do to help prevent falls. This information is not intended to replace advice given to you by your health care provider. Make sure you discuss any questions you have with your health care provider. Document Released: 04/11/2009 Document Revised: 11/21/2015 Document Reviewed: 07/20/2014 Elsevier Interactive Patient Education  2017 [employer].

## 2023-02-12 NOTE — Progress Notes (Signed)
Subjective:   Elijah [name] is a 88 y.o. male who presents for Medicare Annual/Subsequent preventive examination.  Visit Complete: Virtual  I connected with  Elijah [name] on 02/12/23 by a audio enabled telemedicine application and verified that I am speaking with the correct person using two identifiers.  Patient Location: Home  Provider Location: Office/Clinic  I discussed the limitations of evaluation and management by telemedicine. The patient expressed understanding and agreed to proceed.  Vital Signs: Unable to obtain new vitals due to this being a telehealth visit.  Review of Systems     Cardiac Risk Factors include: advanced age (>4men, >40 women);dyslipidemia;hypertension;male gender     Objective:    Today's Vitals   02/12/23 1256  PainSc: 2    There is no height or weight on file to calculate BMI.     02/12/2023    1:11 PM 02/10/2022   10:25 AM 01/23/2022    5:43 PM 02/04/2021    9:33 AM 01/24/2020    9:04 AM 09/03/2019   10:45 PM 09/03/2019    2:56 PM  Advanced Directives  Does Patient Have a Medical Advance Directive? No Yes No No Yes Yes Yes  Type of Furniture conservator/restorer;Living will   Healthcare Power of [place];Living will Healthcare Power of [place];Living will Healthcare Power of [place];Living will  Does patient want to make changes to medical advance directive?      No - Patient declined   Copy of Healthcare Power of Attorney in Chart?  No - copy requested   No - copy requested No - copy requested   Would patient like information on creating a medical advance directive?    No - Patient declined       Current Medications (verified) Outpatient Encounter Medications as of 02/12/2023  Medication Sig   albuterol (VENTOLIN HFA) 108 (90 Base) MCG/ACT inhaler Inhale 1-2 puffs into the lungs every 4 (four) hours as needed for wheezing or shortness of breath.   amLODipine (NORVASC) 10 MG tablet TAKE 1 TABLET BY MOUTH EVERY DAY    atorvastatin (LIPITOR) 80 MG tablet TAKE 1 TABLET BY MOUTH EVERY DAY   cholecalciferol (VITAMIN D3) 25 MCG (1000 UNIT) tablet Take 2,000 Units by mouth daily.   clopidogrel (PLAVIX) 75 MG tablet TAKE 1 TABLET BY MOUTH EVERY DAY   fluticasone (FLONASE) 50 MCG/ACT nasal spray USE 2 SPRAYS IN EACH NOSTRIL DURING ALLERGY SEASON.   losartan (COZAAR) 50 MG tablet TAKE 1 TABLET BY MOUTH EVERY DAY   No facility-administered encounter medications on file as of 02/12/2023.    Allergies (verified) Penicillins   History: Past Medical History:  Diagnosis Date   Allergy    Arthritis    hands   Asthma    Elevated PSA    s/p neg biopsy   Enlarged prostate    Hyperlipidemia    Hypertension    Stroke [facility])    Wears hearing aid    Past Surgical History:  Procedure Laterality Date   CATARACT EXTRACTION W/PHACO Right 05/08/2015   Procedure: CATARACT EXTRACTION PHACO AND INTRAOCULAR LENS PLACEMENT (IOC);  Surgeon: Lockie Mola, MD;  [facility];  Service: Ophthalmology;  Laterality: Right;   IR ANGIO INTRA EXTRACRAN SEL COM CAROTID INNOMINATE BILAT MOD SED  01/26/2022   IR ANGIO VERTEBRAL SEL SUBCLAVIAN INNOMINATE BILAT MOD SED  01/26/2022   PROSTATE BIOPSY     TONSILLECTOMY     Family History  Problem Relation Age of Onset  Heart disease Father    Stroke Mother    Cancer Sister    Social History   Socioeconomic History   Marital status: Married    Spouse name: Not on file   Number of children: 1   Years of education: Not on file   Highest education level: Not on file  Occupational History   Occupation: Retired Hospital doctor: retired  Tobacco Use   Smoking status: Never   Smokeless tobacco: Never  Vaping Use   Vaping status: Never Used  Substance and Sexual Activity   Alcohol use: Yes    Alcohol/week: 14.0 standard drinks of alcohol    Types: 14 Shots of liquor per week   Drug use: No   Sexual activity: Yes  Other Topics Concern   Not on  file  Social History Narrative   One biological son, one adopted son, two granddaughters, 7 & 4 yo      Has living will and HPOA- wife Elijah [name].   Would desire CPR.  Would not want prolonged life support.   Social Determinants of Health   Financial Resource Strain: Low Risk  (02/12/2023)   Overall Financial Resource Strain (CARDIA)    Difficulty of Paying Living Expenses: Not hard at all  Food Insecurity: No Food Insecurity (02/12/2023)   Hunger Vital Sign    Worried About Running Out of Food in the Last Year: Never true    Ran Out of Food in the Last Year: Never true  Transportation Needs: No Transportation Needs (02/12/2023)   PRAPARE - Administrator, Civil Service (Medical): No    Lack of Transportation (Non-Medical): No  Physical Activity: Sufficiently Active (02/12/2023)   Exercise Vital Sign    Days of Exercise per Week: 5 days    Minutes of Exercise per Session: 30 min  Stress: No Stress Concern Present (02/12/2023)   [employer] of Occupational Health - Occupational Stress Questionnaire    Feeling of Stress : Not at all  Social Connections: Socially Integrated (02/12/2023)   Social Connection and Isolation Panel [NHANES]    Frequency of Communication with Friends and Family: Three times a week    Frequency of Social Gatherings with Friends and Family: Once a week    Attends Religious Services: More than 4 times per year    Active Member of [employer] or Organizations: Yes    Attends Engineer, structural: More than 4 times per year    Marital Status: Married    Tobacco Counseling Counseling given: Not Answered   Clinical Intake:  Pre-visit preparation completed: Yes  Pain : 0-10 Pain Score: 2  Pain Type: Acute pain Pain Location: Back Pain Orientation: Lower Pain Descriptors / Indicators: Aching Pain Onset: Yesterday Pain Frequency: Intermittent     Nutritional Risks: None Diabetes: No  How often do you need to have someone help  you when you read instructions, pamphlets, or other written materials from your doctor or pharmacy?: 1 - Never  Interpreter Needed?: No  Information entered by :: NAllen LPN   Activities of Daily Living    02/12/2023   12:57 PM  In your present state of health, do you have any difficulty performing the following activities:  Hearing? 1  Comment wears hearing aids  Vision? 0  Difficulty concentrating or making decisions? 1  Comment forgets names  Walking or climbing stairs? 0  Dressing or bathing? 0  Doing errands, shopping? 0  Preparing Food and eating ?  N  Using the Toilet? N  In the past six months, have you accidently leaked urine? N  Do you have problems with loss of bowel control? N  Managing your Medications? N  Managing your Finances? N  Housekeeping or managing your Housekeeping? N    Patient Care Team: Mliss Sax, MD as PCP - General (Family Medicine) Heloise Purpura, MD as Consulting Physician (Urology) Galen Manila, MD as Referring Physician (Ophthalmology) Debbrah Alar, MD (Dermatology) Dario Ave, DDS as Consulting Physician (Dentistry) Lockie Mola, MD as Referring Physician (Ophthalmology)  Indicate any recent Medical Services you may have received from other than [facility] providers in the past year (date may be approximate).     Assessment:   This is a routine wellness examination for [place].  Hearing/Vision screen Hearing Screening - Comments:: Has hearing aids that are maintained Vision Screening - Comments:: Regular eye exams, [facility]  Dietary issues and exercise activities discussed:     Goals Addressed             This Visit's Progress    Patient Stated       02/12/2023, continue walking 5 days a week       Depression Screen    02/12/2023    1:13 PM 11/02/2022    9:44 AM 05/04/2022   10:28 AM 02/10/2022   10:27 AM 02/10/2022   10:24 AM 02/05/2022   10:16 AM 07/29/2021    8:27 AM  PHQ 2/9  Scores  PHQ - 2 Score 0 0 0 0 0 0 0  PHQ- 9 Score 0          Fall Risk    02/12/2023    1:12 PM 11/02/2022    9:44 AM 05/04/2022   10:27 AM 02/10/2022   10:26 AM 02/05/2022   10:16 AM  Fall Risk   Falls in the past year? 0 0 0 0 0  Number falls in past yr: 0 0 0 0 0  Injury with Fall? 0 0 0 0   Risk for fall due to : Medication side effect No Fall Risks  Impaired balance/gait   Follow up Falls prevention discussed;Falls evaluation completed Falls evaluation completed  Falls evaluation completed;Education provided     MEDICARE RISK AT HOME:  Medicare Risk at Home - 02/12/23 1312     Any stairs in or around the home? Yes    If so, are there any without handrails? No    Home free of loose throw rugs in walkways, pet beds, electrical cords, etc? Yes    Adequate lighting in your home to reduce risk of falls? Yes    Life alert? No    Use of a cane, walker or w/c? No    Grab bars in the bathroom? Yes    Shower chair or bench in shower? Yes    Elevated toilet seat or a handicapped toilet? Yes             TIMED UP AND GO:  Was the test performed?  No    Cognitive Function:    06/11/2016   10:28 AM  MMSE - Mini Mental State Exam  Orientation to time 5  Orientation to Place 5  Registration 3  Attention/ Calculation 0  Recall 3  Language- name 2 objects 0  Language- repeat 1  Language- follow 3 step command 3  Language- read & follow direction 0  Write a sentence 0  Copy design 0  Total score 20  02/12/2023    1:14 PM 02/10/2022   10:29 AM  6CIT Screen  What Year? 0 points 0 points  What month? 0 points 0 points  What time? 0 points 0 points  Count back from 20 0 points 0 points  Months in reverse 0 points 0 points  Repeat phrase 2 points 0 points  Total Score 2 points 0 points    Immunizations Immunization History  Administered Date(s) Administered   Fluad Quad(high Dose 65+) 02/27/2019, 03/25/2020, 03/27/2021, 03/19/2022   Influenza, High Dose  Seasonal PF 04/12/2014, 04/05/2017, 03/30/2018   Influenza-Unspecified 04/06/2016   PFIZER Comirnaty(Gray Top)Covid-19 Tri-Sucrose Vaccine 03/19/2022   PFIZER(Purple Top)SARS-COV-2 Vaccination 08/13/2019, 09/05/2019, 04/24/2020   Pneumococcal Conjugate-13 02/19/2014   Pneumococcal Polysaccharide-23 09/07/2007   Respiratory Syncytial Virus Vaccine,Recomb Aduvanted(Arexvy) 06/01/2022   Td 10/31/2008   Tdap 02/25/2022   Zoster Recombinant(Shingrix) 09/25/2020, 01/09/2021   Zoster, Live 09/07/2007    TDAP status: Up to date  Flu Vaccine status: Due, Education has been provided regarding the importance of this vaccine. Advised may receive this vaccine at local pharmacy or Health Dept. Aware to provide a copy of the vaccination record if obtained from local pharmacy or Health Dept. Verbalized acceptance and understanding.  Pneumococcal vaccine status: Up to date  Covid-19 vaccine status: Information provided on how to obtain vaccines.   Qualifies for Shingles Vaccine? Yes   Zostavax completed Yes   Shingrix Completed?: Yes  Screening Tests Health Maintenance  Topic Date Due   INFLUENZA VACCINE  01/28/2023   Medicare Annual Wellness (AWV)  02/12/2024   DTaP/Tdap/Td (3 - Td or Tdap) 02/26/2032   Pneumonia Vaccine 14+ Years old  Completed   Zoster Vaccines- Shingrix  Completed   HPV VACCINES  Aged Out   COVID-19 Vaccine  Discontinued    Health Maintenance  Health Maintenance Due  Topic Date Due   INFLUENZA VACCINE  01/28/2023    Colorectal cancer screening: No longer required.   Lung Cancer Screening: (Low Dose CT Chest recommended if Age 35-80 years, 20 pack-year currently smoking OR have quit w/in 15years.) does not qualify.   Lung Cancer Screening Referral: no  Additional Screening:  Hepatitis C Screening: does not qualify;   Vision Screening: Recommended annual ophthalmology exams for early detection of glaucoma and other disorders of the eye. Is the patient up to  date with their annual eye exam?  Yes  Who is the provider or what is the name of the office in which the patient attends annual eye exams? [facility] If pt is not established with a provider, would they like to be referred to a provider to establish care? No .   Dental Screening: Recommended annual dental exams for proper oral hygiene  Diabetic Foot Exam: n/a  Community Resource Referral / Chronic Care Management: CRR required this visit?  No   CCM required this visit?  No     Plan:     I have personally reviewed and noted the following in the patient's chart:   Medical and social history Use of alcohol, tobacco or illicit drugs  Current medications and supplements including opioid prescriptions. Patient is not currently taking opioid prescriptions. Functional ability and status Nutritional status Physical activity Advanced directives List of other physicians Hospitalizations, surgeries, and ER visits in previous 12 months Vitals Screenings to include cognitive, depression, and falls Referrals and appointments  In addition, I have reviewed and discussed with patient certain preventive protocols, quality metrics, and best practice recommendations. A written personalized care  plan for preventive services as well as general preventive health recommendations were provided to patient.     Barb Merino, LPN   1/61/0960   After Visit Summary: (MyChart) Due to this being a telephonic visit, the after visit summary with patients personalized plan was offered to patient via MyChart   Nurse Notes: none

## 2023-02-14 ENCOUNTER — Telehealth: Payer: Self-pay

## 2023-02-14 NOTE — Progress Notes (Signed)
   02/14/2023  Patient ID: Elijah [name], male   DOB: 1935/01/28, 88 y.o.   MRN: [id number]  Clinic routed request from patient's PCP, Dr. Doreene Burke, regarding potential drug interaction between atorvastatin 80mg  and clopidogrel 75mg .  Patient's outpatient pharmacist advised that medication combination should not be taken together.  Advised Dr. Doreene Burke that some statins, specifically atorvastatin, have been shown to decrease the efficacy of clopidogrel when the two medications are taken together.  Fluvastatin not rosuvastatin have been shown to have this effect; however, clopidogrel can increase efficacy of rosuvastatin.  I recommend changing patient to rosuvastatin but at a lower dose (10-20mg ) and titrating up if necessary.  Lenna Gilford, PharmD, DPLA

## 2023-02-15 ENCOUNTER — Other Ambulatory Visit: Payer: Self-pay | Admitting: Family Medicine

## 2023-02-15 DIAGNOSIS — Z8673 Personal history of transient ischemic attack (TIA), and cerebral infarction without residual deficits: Secondary | ICD-10-CM

## 2023-02-15 DIAGNOSIS — I63541 Cerebral infarction due to unspecified occlusion or stenosis of right cerebellar artery: Secondary | ICD-10-CM

## 2023-02-15 MED ORDER — ROSUVASTATIN CALCIUM 20 MG PO TABS: 20.0000 mg | ORAL_TABLET | Freq: Every day | ORAL | 3 refills | Status: DC

## 2023-02-15 NOTE — Progress Notes (Signed)
Discontinue atorvastatin and start lower dose rosuvastatin.  Please remember that Plavix can potentiate rosuvastatin.

## 2023-02-17 ENCOUNTER — Other Ambulatory Visit: Payer: Self-pay | Admitting: Family Medicine

## 2023-02-17 DIAGNOSIS — I10 Essential (primary) hypertension: Secondary | ICD-10-CM

## 2023-02-18 ENCOUNTER — Telehealth: Payer: Self-pay | Admitting: Family Medicine

## 2023-02-18 NOTE — Telephone Encounter (Signed)
This medication rosuvastatin (CRESTOR) 20 MG tablet [[id number]] was sent to the wrong pharmacy please send to  [id number] [address] [facility] - [address], [place] [address] Phone: [phone]  Fax: [phone]    Pt stated all his meds should go here.

## 2023-02-19 ENCOUNTER — Other Ambulatory Visit: Payer: Self-pay

## 2023-02-19 DIAGNOSIS — Z8673 Personal history of transient ischemic attack (TIA), and cerebral infarction without residual deficits: Secondary | ICD-10-CM

## 2023-02-19 MED ORDER — ROSUVASTATIN CALCIUM 20 MG PO TABS: 20.0000 mg | ORAL_TABLET | Freq: Every day | ORAL | 3 refills | Status: DC

## 2023-03-18 ENCOUNTER — Telehealth: Payer: Self-pay | Admitting: Family Medicine

## 2023-03-18 DIAGNOSIS — J4520 Mild intermittent asthma, uncomplicated: Secondary | ICD-10-CM

## 2023-03-18 NOTE — Telephone Encounter (Signed)
Refill request  albuterol (VENTOLIN HFA) 108 (90 Base) MCG/ACT inhaler [[id number]]   Pt has not needed this for quite a while but has recently had an attack and needs a refill.  [id number] [address], [facility] [address] [place], [place] [address] Phone: [phone]  Fax: [phone]

## 2023-03-19 MED ORDER — VENTOLIN HFA 108 (90 BASE) MCG/ACT IN AERS: 1.0000 | INHALATION_SPRAY | RESPIRATORY_TRACT | 1 refills | Status: AC | PRN

## 2023-03-23 DIAGNOSIS — J988 Other specified respiratory disorders: Secondary | ICD-10-CM | POA: Diagnosis not present

## 2023-03-23 DIAGNOSIS — Z6824 Body mass index (BMI) 24.0-24.9, adult: Secondary | ICD-10-CM | POA: Diagnosis not present

## 2023-03-25 DIAGNOSIS — R208 Other disturbances of skin sensation: Secondary | ICD-10-CM | POA: Diagnosis not present

## 2023-03-25 DIAGNOSIS — L720 Epidermal cyst: Secondary | ICD-10-CM | POA: Diagnosis not present

## 2023-03-25 DIAGNOSIS — D485 Neoplasm of uncertain behavior of skin: Secondary | ICD-10-CM | POA: Diagnosis not present

## 2023-05-05 ENCOUNTER — Ambulatory Visit: Payer: Medicare Other | Admitting: Family Medicine

## 2023-05-07 ENCOUNTER — Encounter: Payer: Self-pay | Admitting: Family Medicine

## 2023-05-07 ENCOUNTER — Ambulatory Visit (INDEPENDENT_AMBULATORY_CARE_PROVIDER_SITE_OTHER): Payer: Medicare Other | Admitting: Family Medicine

## 2023-05-07 VITALS — BP 122/72 | HR 61 | Temp 97.5°F | Ht 67.0 in | Wt 150.0 lb

## 2023-05-07 DIAGNOSIS — E782 Mixed hyperlipidemia: Secondary | ICD-10-CM

## 2023-05-07 DIAGNOSIS — I10 Essential (primary) hypertension: Secondary | ICD-10-CM | POA: Diagnosis not present

## 2023-05-07 DIAGNOSIS — Z8673 Personal history of transient ischemic attack (TIA), and cerebral infarction without residual deficits: Secondary | ICD-10-CM | POA: Diagnosis not present

## 2023-05-07 LAB — BASIC METABOLIC PANEL
BUN: 15 mg/dL (ref 6–23)
CO2: 27 meq/L (ref 19–32)
Calcium: 9.2 mg/dL (ref 8.4–10.5)
Chloride: 104 meq/L (ref 96–112)
Creatinine, Ser: 1.14 mg/dL (ref 0.40–1.50)
GFR: 58.27 mL/min — ABNORMAL LOW (ref >60.00)
Glucose, Bld: 113 mg/dL — ABNORMAL HIGH (ref 70–99)
Potassium: 3.9 meq/L (ref 3.5–5.1)
Sodium: 138 meq/L (ref 135–145)

## 2023-05-07 LAB — CBC
HCT: 42.8 % (ref 39.0–52.0)
Hemoglobin: 14.5 g/dL (ref 13.0–17.0)
MCHC: 33.8 g/dL (ref 30.0–36.0)
MCV: 86.6 fL (ref 78.0–100.0)
Platelets: 204 10*3/uL (ref 150.0–400.0)
RBC: 4.94 Mil/uL (ref 4.22–5.81)
RDW: 14.3 % (ref 11.5–15.5)
WBC: 6.5 10*3/uL (ref 4.0–10.5)

## 2023-05-07 LAB — LIPID PANEL
Cholesterol: 136 mg/dL (ref 0–200)
HDL: 60.8 mg/dL (ref >39.00)
LDL Cholesterol: 64 mg/dL (ref 0–99)
NonHDL: 74.77
Total CHOL/HDL Ratio: 2
Triglycerides: 52 mg/dL (ref 0.0–149.0)
VLDL: 10.4 mg/dL (ref 0.0–40.0)

## 2023-05-07 NOTE — Progress Notes (Unsigned)
Established Patient Office Visit   Subjective:  Patient ID: Elijah [name], male    DOB: [birth year]  Age: 88 y.o. MRN: [id number]  Chief Complaint  Patient presents with   Medical Management of Chronic Issues    6 month follow up. Pt is fasting. Has questions from having COVID 1 month ago. Has questions about getting a flu shot.    Hypertension    Follow up/ Blood pressures reading at home are good.     Hypertension Pertinent negatives include no blurred vision.   Encounter Diagnoses  Name Primary?   Essential hypertension Yes   Mixed hyperlipidemia    History of CVA (cerebrovascular accident)    Follow-up of above.  Doing well.  Blood pressure well-controlled with amlodipine 10 mg and losartan 50 mg.  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  Both he and his wife had COVID last month.  They have recovered.  They are back to exercising by walking for 30 minutes daily. {History (Optional):23778}  Review of Systems  Constitutional: Negative.   HENT: Negative.    Eyes:  Negative for blurred vision, discharge and redness.  Respiratory: Negative.    Cardiovascular: Negative.   Gastrointestinal:  Negative for abdominal pain.  Genitourinary: Negative.   Musculoskeletal: Negative.  Negative for myalgias.  Skin:  Negative for rash.  Neurological:  Negative for tingling, loss of consciousness and weakness.  Endo/Heme/Allergies:  Negative for polydipsia.     Current Outpatient Medications:    albuterol (VENTOLIN HFA) 108 (90 Base) MCG/ACT inhaler, Inhale 1-2 puffs into the lungs every 4 (four) hours as needed for wheezing or shortness of breath., Disp: 18 each, Rfl: 1   amLODipine (NORVASC) 10 MG tablet, TAKE 1 TABLET BY MOUTH EVERY DAY, Disp: 90 tablet, Rfl: 3   cholecalciferol (VITAMIN D3) 25 MCG (1000 UNIT) tablet, Take 2,000 Units by mouth daily., Disp: , Rfl:    clopidogrel  (PLAVIX) 75 MG tablet, TAKE 1 TABLET BY MOUTH EVERY DAY, Disp: 90 tablet, Rfl: 1   fluticasone (FLONASE) 50 MCG/ACT nasal spray, USE 2 SPRAYS IN EACH NOSTRIL DURING ALLERGY SEASON., Disp: 48 mL, Rfl: 2   losartan (COZAAR) 50 MG tablet, TAKE 1 TABLET BY MOUTH EVERY DAY, Disp: 90 tablet, Rfl: 2   rosuvastatin (CRESTOR) 20 MG tablet, Take 1 tablet (20 mg total) by mouth daily., Disp: 90 tablet, Rfl: 3   Objective:     BP 122/72   Pulse 61   Temp (!) 97.5 F (36.4 C)   Ht 5\' 7"  (1.702 m)   Wt 150 lb (68 kg)   SpO2 99%   BMI 23.49 kg/m  {Vitals History (Optional):23777}  Physical Exam Constitutional:      General: He is not in acute distress.    Appearance: Normal appearance. He is not ill-appearing, toxic-appearing or diaphoretic.  HENT:     Head: Normocephalic and atraumatic.     Right Ear: External ear normal.     Left Ear: External ear normal.     Mouth/Throat:     Mouth: Mucous membranes are moist.     Pharynx: Oropharynx is clear. No oropharyngeal exudate or posterior oropharyngeal erythema.  Eyes:     General: No scleral icterus.       Right eye: No discharge.        Left eye: No discharge.     Extraocular Movements: Extraocular movements intact.     Conjunctiva/sclera: Conjunctivae  normal.     Pupils: Pupils are equal, round, and reactive to light.  Cardiovascular:     Rate and Rhythm: Normal rate and regular rhythm.  Pulmonary:     Effort: Pulmonary effort is normal. No respiratory distress.     Breath sounds: Normal breath sounds.  Abdominal:     General: Bowel sounds are normal.     Tenderness: There is no abdominal tenderness. There is no guarding.  Musculoskeletal:     Cervical back: No rigidity or tenderness.  Skin:    General: Skin is warm and dry.  Neurological:     Mental Status: He is alert and oriented to person, place, and time.  Psychiatric:        Mood and Affect: Mood normal.        Behavior: Behavior normal.      No results found for any  visits on 05/07/23.  {Labs (Optional):23779}  The ASCVD Risk score (Arnett DK, et al., 2019) failed to calculate for the following reasons:   The 2019 ASCVD risk score is only valid for ages 70 to 69   The patient has a prior MI or stroke diagnosis    Assessment & Plan:   Essential hypertension -     CBC -     Basic metabolic panel  Mixed hyperlipidemia -     Lipid panel  History of CVA (cerebrovascular accident)    Return in about 6 months (around 11/04/2023).    Mliss Sax, MD

## 2023-05-11 DIAGNOSIS — Z23 Encounter for immunization: Secondary | ICD-10-CM | POA: Diagnosis not present

## 2023-05-22 ENCOUNTER — Other Ambulatory Visit: Payer: Self-pay | Admitting: Family Medicine

## 2023-07-03 ENCOUNTER — Other Ambulatory Visit: Payer: Self-pay | Admitting: Family Medicine

## 2023-07-03 DIAGNOSIS — Z8673 Personal history of transient ischemic attack (TIA), and cerebral infarction without residual deficits: Secondary | ICD-10-CM

## 2023-08-12 DIAGNOSIS — D225 Melanocytic nevi of trunk: Secondary | ICD-10-CM | POA: Diagnosis not present

## 2023-08-12 DIAGNOSIS — D485 Neoplasm of uncertain behavior of skin: Secondary | ICD-10-CM | POA: Diagnosis not present

## 2023-08-12 DIAGNOSIS — D2272 Melanocytic nevi of left lower limb, including hip: Secondary | ICD-10-CM | POA: Diagnosis not present

## 2023-08-12 DIAGNOSIS — Z85820 Personal history of malignant melanoma of skin: Secondary | ICD-10-CM | POA: Diagnosis not present

## 2023-08-12 DIAGNOSIS — L821 Other seborrheic keratosis: Secondary | ICD-10-CM | POA: Diagnosis not present

## 2023-08-12 DIAGNOSIS — D2262 Melanocytic nevi of left upper limb, including shoulder: Secondary | ICD-10-CM | POA: Diagnosis not present

## 2023-08-12 DIAGNOSIS — Z85828 Personal history of other malignant neoplasm of skin: Secondary | ICD-10-CM | POA: Diagnosis not present

## 2023-08-12 DIAGNOSIS — D2271 Melanocytic nevi of right lower limb, including hip: Secondary | ICD-10-CM | POA: Diagnosis not present

## 2023-08-12 DIAGNOSIS — Z08 Encounter for follow-up examination after completed treatment for malignant neoplasm: Secondary | ICD-10-CM | POA: Diagnosis not present

## 2023-08-12 DIAGNOSIS — D2261 Melanocytic nevi of right upper limb, including shoulder: Secondary | ICD-10-CM | POA: Diagnosis not present

## 2023-08-14 ENCOUNTER — Other Ambulatory Visit: Payer: Self-pay | Admitting: Family Medicine

## 2023-08-14 DIAGNOSIS — I10 Essential (primary) hypertension: Secondary | ICD-10-CM

## 2023-09-02 ENCOUNTER — Encounter: Payer: Self-pay | Admitting: Neurology

## 2023-09-02 ENCOUNTER — Ambulatory Visit (INDEPENDENT_AMBULATORY_CARE_PROVIDER_SITE_OTHER): Payer: Medicare Other | Admitting: Neurology

## 2023-09-02 VITALS — BP 128/68 | HR 68 | Ht 67.0 in | Wt 155.0 lb

## 2023-09-02 DIAGNOSIS — G3184 Mild cognitive impairment, so stated: Secondary | ICD-10-CM

## 2023-09-02 DIAGNOSIS — I6503 Occlusion and stenosis of bilateral vertebral arteries: Secondary | ICD-10-CM

## 2023-09-02 DIAGNOSIS — Z8673 Personal history of transient ischemic attack (TIA), and cerebral infarction without residual deficits: Secondary | ICD-10-CM | POA: Diagnosis not present

## 2023-09-02 NOTE — Progress Notes (Signed)
 [facility] [address] [place]. [place] [address] [id number]       OFFICE FOLLOW-UP NOTE  [name] Date of Birth:  01-14-1935 Medical Record Number:  [id number]   HPI: Initial visit 03/05/2022 Elijah [name] is a 89 year old Caucasian male seen today for initial office follow-up visit following hospital admission for suspected stroke in July 2023.  History is obtained from the patient and review of electronic medical records as well as his wife and I personally reviewed pertinent available imaging films in PACS.  He has past medical history of hypertension, hyperlipidemia, stroke, asthma, vitamin D deficiency who presented on 01/23/2022 with sudden onset of dizziness and syncopal episode.  His symptoms are transient and resolved and NIH stroke scale was 0 on admission.  CT head was unremarkable for acute process.  CT angiogram of the head and neck showed progressive high-grade stenosis or occlusion of the distal left V4 segment and moderate stenosis of the distal right V4 segment and progressive narrowing of the basilar artery with high-grade proximal stenosis.  Stable proximal occlusion of vertebral arteries bilaterally.  He was felt to be a candidate for TNK which was administered uneventfully.  He was kept in the ICU and closely monitored.  CT head was unremarkable MRI scan was negative for acute infarct.  Echocardiogram showed ejection fraction of 50-55%.  LDL cholesterol was 53 mg percent and hemoglobin A1c 5.4.  He was not felt to be a suitable candidate for endovascular treatment with angioplasty or stenting given multifocal and diffuse vessel involvement.  He was on aspirin prior to admission he was switched to dual antiplatelet therapy for 3 months and advised to stay on Plavix alone after that.  Patient states is doing well.  He is made full recovery.  He has had no recurrent TIA or stroke symptoms.  He is tolerating aspirin and Plavix well without bruising or bleeding.   His blood pressure is well controlled today it is 109/69.  He is tolerating Lipitor well without muscle aches and pains.  He has no complaints today. Update 09/03/2022 : He returns for follow-up after last visit 6 months ago.  He is accompanied by his wife.  He is doing well.  He has had no recurrent TIA or strokelike symptoms.  Continues to have mild imbalance particularly when he gets up suddenly or makes a quick move.  He has not had no falls or injuries.  He denies vertigo or diplopia.  He is tolerating Plavix well with only minor bruising but no bleeding.  He is quite active and walks 1-1/2 miles 5 days a week.  His blood pressure is well-controlled and usually in the 130s at home though today it is elevated at 154/79.  He is tolerating Lipitor well without muscle aches and pains.  He has an appointment to see his primary care physician in 3 months to have follow-up with  lab work to be checked.  He was called by Dr. Fatima Sanger office for a follow-up catheter angiogram patient and wife are reluctant to get this done since he is doing so well Update 09/02/2023 : He returns for follow-up after last visit a year ago.  He is accompanied by his wife.  He continues to do well.  He has had no recurrent TIA or stroke symptoms.  He remains on Plavix which is tolerating well with minor bruising and easy bleeding.  He is still quite active and walks 1.5 miles a day 5 days a week.  His  blood pressure under good control.  He is tolerating Crestor well without side effects.  His last lipid profile on 05/07/2023 showed LDL-cholesterol to be optimal at 64 mg percent.  She does drink plenty of fluids.  He has had no new health issues.  He does complain of mild short-term memory difficulties but may remember later.  He does also do mentally challenging activities and plays challenging card games with his wife. ROS:   14 system review of systems is positive for dizziness, imbalance, decreased hearing all other systems  negative  PMH:  Past Medical History:  Diagnosis Date   Allergy    Arthritis    hands   Asthma    Elevated PSA    s/p neg biopsy   Enlarged prostate    Hyperlipidemia    Hypertension    Stroke [facility])    Wears hearing aid     Social History:  Social History   Socioeconomic History   Marital status: Married    Spouse name: Not on file   Number of children: 1   Years of education: Not on file   Highest education level: Not on file  Occupational History   Occupation: Retired Hospital doctor: retired  Tobacco Use   Smoking status: Never   Smokeless tobacco: Never  Vaping Use   Vaping status: Never Used  Substance and Sexual Activity   Alcohol use: Yes    Alcohol/week: 14.0 standard drinks of alcohol    Types: 14 Shots of liquor per week   Drug use: No   Sexual activity: Yes  Other Topics Concern   Not on file  Social History Narrative   One biological son, one adopted son, two granddaughters, 7 & 24 yo      Has living will and HPOA- wife Elijah [name].   Would desire CPR.  Would not want prolonged life support.   Social Drivers of Corporate investment banker Strain: Low Risk  (02/12/2023)   Overall Financial Resource Strain (CARDIA)    Difficulty of Paying Living Expenses: Not hard at all  Food Insecurity: No Food Insecurity (02/12/2023)   Hunger Vital Sign    Worried About Running Out of Food in the Last Year: Never true    Ran Out of Food in the Last Year: Never true  Transportation Needs: No Transportation Needs (02/12/2023)   PRAPARE - Administrator, Civil Service (Medical): No    Lack of Transportation (Non-Medical): No  Physical Activity: Sufficiently Active (02/12/2023)   Exercise Vital Sign    Days of Exercise per Week: 5 days    Minutes of Exercise per Session: 30 min  Stress: No Stress Concern Present (02/12/2023)   [employer] of Occupational Health - Occupational Stress Questionnaire    Feeling of Stress : Not at all   Social Connections: Socially Integrated (02/12/2023)   Social Connection and Isolation Panel [NHANES]    Frequency of Communication with Friends and Family: Three times a week    Frequency of Social Gatherings with Friends and Family: Once a week    Attends Religious Services: More than 4 times per year    Active Member of [employer] or Organizations: Yes    Attends Banker Meetings: More than 4 times per year    Marital Status: Married  Catering manager Violence: Not At Risk (02/12/2023)   Humiliation, Afraid, Rape, and Kick questionnaire    Fear of Current or Ex-Partner: No  Emotionally Abused: No    Physically Abused: No    Sexually Abused: No    Medications:   Current Outpatient Medications on File Prior to Visit  Medication Sig Dispense Refill   albuterol (VENTOLIN HFA) 108 (90 Base) MCG/ACT inhaler Inhale 1-2 puffs into the lungs every 4 (four) hours as needed for wheezing or shortness of breath. 18 each 1   amLODipine (NORVASC) 10 MG tablet TAKE 1 TABLET BY MOUTH EVERY DAY 90 tablet 3   cholecalciferol (VITAMIN D3) 25 MCG (1000 UNIT) tablet Take 2,000 Units by mouth daily.     clopidogrel (PLAVIX) 75 MG tablet TAKE 1 TABLET BY MOUTH EVERY DAY 90 tablet 1   fluticasone (FLONASE) 50 MCG/ACT nasal spray USE 2 SPRAYS IN EACH NOSTRIL DURING ALLERGY SEASON. 48 mL 2   losartan (COZAAR) 50 MG tablet TAKE 1 TABLET BY MOUTH EVERY DAY 90 tablet 2   rosuvastatin (CRESTOR) 20 MG tablet Take 1 tablet (20 mg total) by mouth daily. 90 tablet 3   No current facility-administered medications on file prior to visit.    Allergies:   Allergies  Allergen Reactions   Penicillins Hives    Physical Exam General: well developed, well nourished pleasant elderly Caucasian male, seated, in no evident distress Head: head normocephalic and atraumatic.  Neck: supple with no carotid or supraclavicular bruits Cardiovascular: regular rate and rhythm, no murmurs Musculoskeletal: no  deformity Skin:  no rash/petichiae Vascular:  Normal pulses all extremities Vitals:   09/02/23 1411  BP: 128/68  Pulse: 68   Neurologic Exam Mental Status: Awake and fully alert. Oriented to place and time. Recent and remote memory intact. Attention span, concentration and fund of knowledge appropriate. Mood and affect appropriate.  Diminished recall 2/3.  Able to name 13 animals which can walk on 4 legs.  Clock drawing 4/4. Cranial Nerves: Fundoscopic exam not done. Pupils equal, briskly reactive to light. Extraocular movements full without nystagmus. Visual fields full to confrontation. Hearing diminished bilaterally facial sensation intact. Face, tongue, palate moves normally and symmetrically.  Motor: Normal bulk and tone. Normal strength in all tested extremity muscles. Sensory.: intact to touch ,pinprick .position and vibratory sensation.  Coordination: Rapid alternating movements normal in all extremities. Finger-to-nose and heel-to-shin performed accurately bilaterally. Gait and Station: Arises from chair without difficulty. Stance is normal. Gait demonstrates normal stride length and balance . Able to heel, toe and tandem walk with moderate difficulty.  Reflexes: 1+ and symmetric. Toes downgoing.      ASSESSMENT: 89 year old Caucasian male with posterior circulation TIA in July 2023 treated with IV TNK with full recovery and MRI negative for acute infarct but showing multivessel occlusive posterior circulation disease.  Vascular risk factors of hypertension, hyperlipidemia, intracranial atherosclerosis and age.  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 I also advised the patient to eat a healthy diet with plenty of whole grains, cereals, fruits and vegetables, exercise regularly and maintain ideal body weight .I also encouraged him to keep himself well-hydrated and drink plenty of fluids daily.  Check screening  carotid ultrasound study he also has mild age-related cognitive impairment and I recommend he continue to increase participation in cognitively challenging activities like solving crossword puzzles, playing bridge and sudoku.  We also discussed memory compensation strategies..  Followup in the future with me in 12 months or call earlier if necessary.Greater than 50% of time during this 35 minute visit was spent on counseling,explanation of diagnosis of TIA and occluded vertebrobasilar disease, planning of further management, discussion with patient and family and coordination of care Delia Heady, MD Note: This document was prepared with digital dictation and possible smart phrase technology. Any transcriptional errors that result from this process are unintentional

## 2023-09-02 NOTE — Patient Instructions (Addendum)
 I had a long d/w patient and his wife about his remote posterior circulation TIA and occlusive vertebrobasilar disease, risk benefits of angioplasty/stenting,risk for recurrent stroke/TIAs, personally independently reviewed imaging studies and stroke evaluation results and answered questions.Continue  Plavix   for secondary stroke prevention and maintain strict control of hypertension with blood pressure goal below 130/90, diabetes with hemoglobin A1c goal below 6.5% and lipids with LDL cholesterol goal below 70 mg/dL. I also advised the patient to eat a healthy diet with plenty of whole grains, cereals, fruits and vegetables, exercise regularly and maintain ideal body weight .I also encouraged him to keep himself well-hydrated and drink plenty of fluids daily.  Check screening carotid ultrasound study he also has mild age-related cognitive impairment and I recommend he continue to increase participation in cognitively challenging activities like solving crossword puzzles, playing bridge and sudoku.  We also discussed memory compensation strategies..  Followup in the future with me in 12 months or call earlier if necessary. Memory Compensation Strategies  Use "WARM" strategy.  W= write it down  A= associate it  R= repeat it  M= make a mental note  2.   You can keep a Glass blower/designer.  Use a 3-ring notebook with sections for the following: calendar, important names and phone numbers,  medications, doctors' names/phone numbers, lists/reminders, and a section to journal what you did  each day.   3.    Use a calendar to write appointments down.  4.    Write yourself a schedule for the day.  This can be placed on the calendar or in a separate section of the Memory Notebook.  Keeping a  regular schedule can help memory.  5.    Use medication organizer with sections for each day or morning/evening pills.  You may need help loading it  6.    Keep a basket, or pegboard by the door.  Place items that you  need to take out with you in the basket or on the pegboard.  You may also want to  include a message board for reminders.  7.    Use sticky notes.  Place sticky notes with reminders in a place where the task is performed.  For example: " turn off the  stove" placed by the stove, "lock the door" placed on the door at eye level, " take your medications" on  the bathroom mirror or by the place where you normally take your medications.  8.    Use alarms/timers.  Use while cooking to remind yourself to check on food or as a reminder to take your medicine, or as a  reminder to make a call, or as a reminder to perform another task, etc.

## 2023-09-15 ENCOUNTER — Ambulatory Visit (HOSPITAL_COMMUNITY)
Admission: RE | Admit: 2023-09-15 | Discharge: 2023-09-15 | Disposition: A | Discharge status: Home / Self Care | Source: Ambulatory Visit | Attending: Neurology | Admitting: Neurology

## 2023-09-15 DIAGNOSIS — I6503 Occlusion and stenosis of bilateral vertebral arteries: Secondary | ICD-10-CM | POA: Diagnosis not present

## 2023-09-16 ENCOUNTER — Telehealth: Payer: Self-pay | Admitting: *Deleted

## 2023-09-16 DIAGNOSIS — D2261 Melanocytic nevi of right upper limb, including shoulder: Secondary | ICD-10-CM | POA: Diagnosis not present

## 2023-09-16 DIAGNOSIS — L905 Scar conditions and fibrosis of skin: Secondary | ICD-10-CM | POA: Diagnosis not present

## 2023-09-16 DIAGNOSIS — D485 Neoplasm of uncertain behavior of skin: Secondary | ICD-10-CM | POA: Diagnosis not present

## 2023-09-16 NOTE — Progress Notes (Signed)
Kindly inform the patient that carotid ultrasound study shows no significant narrowing of either carotid artery in the neck.  Nothing to worry about

## 2023-09-16 NOTE — Telephone Encounter (Signed)
 Spoke to patient gave carotid ultrasound results Pt states saw results on mychart but appreciated the call . Pt thanked me for calling

## 2023-09-16 NOTE — Telephone Encounter (Signed)
-----   Message from Delia Heady sent at 09/16/2023  4:11 PM EDT ----- Joneen Roach inform the patient that carotid ultrasound study shows no significant narrowing of either carotid artery in the neck.  Nothing to worry about

## 2023-11-04 ENCOUNTER — Ambulatory Visit (INDEPENDENT_AMBULATORY_CARE_PROVIDER_SITE_OTHER): Payer: BLUE CROSS/BLUE SHIELD | Admitting: Family Medicine

## 2023-11-04 ENCOUNTER — Encounter: Payer: Self-pay | Admitting: Family Medicine

## 2023-11-04 VITALS — BP 122/76 | HR 62 | Temp 97.2°F | Ht 67.0 in | Wt 152.8 lb

## 2023-11-04 DIAGNOSIS — I10 Essential (primary) hypertension: Secondary | ICD-10-CM | POA: Diagnosis not present

## 2023-11-04 DIAGNOSIS — E782 Mixed hyperlipidemia: Secondary | ICD-10-CM

## 2023-11-04 NOTE — Progress Notes (Signed)
 Established Patient Office Visit   Subjective:  Patient ID: Elijah [name], male    DOB: 1934/08/14  Age: 89 y.o. MRN: [id number]  Chief Complaint  Patient presents with   Medical Management of Chronic Issues    6 month follow up. Pt is fasting.     HPI Encounter Diagnoses  Name Primary?   Essential hypertension Yes   Mixed hyperlipidemia    Blood pressure well-controlled with amlodipine  10 mg and losartan 50.  Continues 20 mg of rosuvastatin  for control of cholesterol.  Brings in an extensive list of blood pressures that are running in the 120s to 130s over 70s to low 80s.  Continues walking daily with his wife for exercise.     Review of Systems  Constitutional: Negative.   HENT: Negative.    Eyes:  Negative for blurred vision, discharge and redness.  Respiratory: Negative.    Cardiovascular: Negative.   Gastrointestinal:  Negative for abdominal pain.  Genitourinary: Negative.   Musculoskeletal: Negative.  Negative for myalgias.  Skin:  Negative for rash.  Neurological:  Negative for tingling, loss of consciousness and weakness.  Endo/Heme/Allergies:  Negative for polydipsia.     Current Outpatient Medications:    albuterol  (VENTOLIN  HFA) 108 (90 Base) MCG/ACT inhaler, Inhale 1-2 puffs into the lungs every 4 (four) hours as needed for wheezing or shortness of breath., Disp: 18 each, Rfl: 1   amLODipine  (NORVASC ) 10 MG tablet, TAKE 1 TABLET BY MOUTH EVERY DAY, Disp: 90 tablet, Rfl: 3   cholecalciferol (VITAMIN D3) 25 MCG (1000 UNIT) tablet, Take 2,000 Units by mouth daily., Disp: , Rfl:    clopidogrel  (PLAVIX ) 75 MG tablet, TAKE 1 TABLET BY MOUTH EVERY DAY, Disp: 90 tablet, Rfl: 1   fluticasone  (FLONASE ) 50 MCG/ACT nasal spray, USE 2 SPRAYS IN EACH NOSTRIL DURING ALLERGY SEASON., Disp: 48 mL, Rfl: 2   losartan (COZAAR) 50 MG tablet, TAKE 1 TABLET BY MOUTH EVERY DAY, Disp: 90 tablet, Rfl: 2   rosuvastatin  (CRESTOR ) 20 MG tablet, Take 1 tablet (20 mg total) by mouth daily.,  Disp: 90 tablet, Rfl: 3   Objective:     BP 122/76 (Cuff Size: Normal)   Pulse 62   Temp (!) 97.2 F (36.2 C) (Temporal)   Ht 5\' 7"  (1.702 m)   Wt 152 lb 12.8 oz (69.3 kg)   SpO2 98%   BMI 23.93 kg/m  BP Readings from Last 3 Encounters:  11/04/23 122/76  09/02/23 128/68  05/07/23 122/72   Wt Readings from Last 3 Encounters:  11/04/23 152 lb 12.8 oz (69.3 kg)  09/02/23 155 lb (70.3 kg)  05/07/23 150 lb (68 kg)      Physical Exam Constitutional:      General: He is not in acute distress.    Appearance: Normal appearance. He is not ill-appearing, toxic-appearing or diaphoretic.  HENT:     Head: Normocephalic and atraumatic.     Right Ear: External ear normal.     Left Ear: External ear normal.  Eyes:     General: No scleral icterus.       Right eye: No discharge.        Left eye: No discharge.     Extraocular Movements: Extraocular movements intact.     Conjunctiva/sclera: Conjunctivae normal.  Cardiovascular:     Rate and Rhythm: Normal rate and regular rhythm.  Pulmonary:     Effort: Pulmonary effort is normal. No respiratory distress.     Breath sounds: No wheezing, rhonchi or  rales.  Skin:    General: Skin is warm and dry.  Neurological:     Mental Status: He is alert and oriented to person, place, and time.  Psychiatric:        Mood and Affect: Mood normal.        Behavior: Behavior normal.      No results found for any visits on 11/04/23.    The ASCVD Risk score (Arnett DK, et al., 2019) failed to calculate for the following reasons:   The 2019 ASCVD risk score is only valid for ages 14 to 84   Risk score cannot be calculated because patient has a medical history suggesting prior/existing ASCVD    Assessment & Plan:   Essential hypertension  Mixed hyperlipidemia    Return in about 6 months (around 05/06/2024), or if symptoms worsen or fail to improve.   Continue current medications.  Continue exercising. Tonna Frederic, MD

## 2023-11-10 ENCOUNTER — Other Ambulatory Visit: Payer: Self-pay | Admitting: Family Medicine

## 2023-11-10 DIAGNOSIS — I10 Essential (primary) hypertension: Secondary | ICD-10-CM

## 2023-12-17 ENCOUNTER — Other Ambulatory Visit: Payer: Self-pay | Admitting: Family Medicine

## 2023-12-17 DIAGNOSIS — Z8673 Personal history of transient ischemic attack (TIA), and cerebral infarction without residual deficits: Secondary | ICD-10-CM

## 2024-02-06 ENCOUNTER — Other Ambulatory Visit: Payer: Self-pay | Admitting: Family Medicine

## 2024-02-06 DIAGNOSIS — Z8673 Personal history of transient ischemic attack (TIA), and cerebral infarction without residual deficits: Secondary | ICD-10-CM

## 2024-02-18 ENCOUNTER — Ambulatory Visit: Payer: BLUE CROSS/BLUE SHIELD

## 2024-02-18 DIAGNOSIS — Z0000 Encounter for general adult medical examination without abnormal findings: Secondary | ICD-10-CM

## 2024-02-18 NOTE — Patient Instructions (Signed)
 Mr. Lotito , Thank you for taking time out of your busy schedule to complete your Annual Wellness Visit with me. I enjoyed our conversation and look forward to speaking with you again next year. I, as well as your care team,  appreciate your ongoing commitment to your health goals. Please review the following plan we discussed and let me know if I can assist you in the future. Your Game plan/ To Do List    Referrals: If you haven't heard from the office you've been referred to, please reach out to them at the phone provided.   Follow up Visits: We will see or speak with you next year for your Next Medicare AWV with our clinical staff Have you seen your provider in the last 6 months (3 months if uncontrolled diabetes)? Yes  Clinician Recommendations:  Aim for 30 minutes of exercise or brisk walking, 6-8 glasses of water, and 5 servings of fruits and vegetables each day.       This is a list of the screenings recommended for you:  Health Maintenance  Topic Date Due   Flu Shot  01/28/2024   Medicare Annual Wellness Visit  02/17/2025   DTaP/Tdap/Td vaccine (3 - Td or Tdap) 02/26/2032   Pneumococcal Vaccine for age over 15  Completed   Zoster (Shingles) Vaccine  Completed   HPV Vaccine  Aged Out   Meningitis B Vaccine  Aged Out   COVID-19 Vaccine  Discontinued    Advanced directives: (Copy Requested) Please bring a copy of your health care power of attorney and living will to the office to be added to your chart at your convenience. You can mail to [facility] [address] [place], [place] [address] or email to [email or web address] Advance Care Planning is important because it:  [x]  Makes sure you receive the medical care that is consistent with your values, goals, and preferences  [x]  It provides guidance to your family and loved ones and reduces their decisional burden about whether or not they are making the right decisions based on your wishes.  Follow the  link provided in your after visit summary or read over the paperwork we have mailed to you to help you started getting your Advance Directives in place. If you need assistance in completing these, please reach out to us  so that we can help you!  See attachments for Preventive Care and Fall Prevention Tips.

## 2024-02-18 NOTE — Progress Notes (Signed)
 Subjective:   Elijah [name] is a 89 y.o. who presents for a Medicare Wellness preventive visit.  As a reminder, Annual Wellness Visits don't include a physical exam, and some assessments may be limited, especially if this visit is performed virtually. We may recommend an in-person follow-up visit with your provider if needed.  Visit Complete: Virtual I connected with  Elijah [name] on 02/18/24 by a audio enabled telemedicine application and verified that I am speaking with the correct person using two identifiers.  Patient Location: Home  Provider Location: Office/Clinic  I discussed the limitations of evaluation and management by telemedicine. The patient expressed understanding and agreed to proceed.  Vital Signs: Because this visit was a virtual/telehealth visit, some criteria may be missing or patient reported. Any vitals not documented were not able to be obtained and vitals that have been documented are patient reported.  VideoError- Librarian, academic were attempted between this provider and patient, however failed, due to patient having technical difficulties OR patient did not have access to video capability.  We continued and completed visit with audio only.   Persons Participating in Visit: Patient.  AWV Questionnaire: No: Patient Medicare AWV questionnaire was not completed prior to this visit.  Cardiac Risk Factors include: advanced age (>43men, >48 women);dyslipidemia;hypertension;male gender     Objective:    Today's Vitals   There is no height or weight on file to calculate BMI.     02/18/2024    1:04 PM 02/12/2023    1:11 PM 02/10/2022   10:25 AM 01/23/2022    5:43 PM 02/04/2021    9:33 AM 01/24/2020    9:04 AM 09/03/2019   10:45 PM  Advanced Directives  Does Patient Have a Medical Advance Directive? No No Yes No No Yes Yes  Type of Surveyor, minerals;Living will   Healthcare Power of [place];Living will  Healthcare Power of [place];Living will  Does patient want to make changes to medical advance directive?       No - Patient declined  Copy of Healthcare Power of Attorney in Chart?   No - copy requested   No - copy requested No - copy requested  Would patient like information on creating a medical advance directive? No - Patient declined    No - Patient declined      Current Medications (verified) Outpatient Encounter Medications as of 02/18/2024  Medication Sig   albuterol  (VENTOLIN  HFA) 108 (90 Base) MCG/ACT inhaler Inhale 1-2 puffs into the lungs every 4 (four) hours as needed for wheezing or shortness of breath.   amLODipine  (NORVASC ) 10 MG tablet TAKE 1 TABLET BY MOUTH EVERY DAY   cholecalciferol (VITAMIN D3) 25 MCG (1000 UNIT) tablet Take 2,000 Units by mouth daily.   clopidogrel  (PLAVIX ) 75 MG tablet TAKE 1 TABLET BY MOUTH EVERY DAY   fluticasone  (FLONASE ) 50 MCG/ACT nasal spray USE 2 SPRAYS IN EACH NOSTRIL DURING ALLERGY SEASON.   losartan (COZAAR) 50 MG tablet TAKE 1 TABLET BY MOUTH EVERY DAY   rosuvastatin  (CRESTOR ) 20 MG tablet TAKE 1 TABLET BY MOUTH EVERY DAY   No facility-administered encounter medications on file as of 02/18/2024.    Allergies (verified) Penicillins   History: Past Medical History:  Diagnosis Date   Allergy    Arthritis    hands   Asthma    Elevated PSA    s/p neg biopsy   Enlarged prostate    Hyperlipidemia    Hypertension  Stroke [facility])    Wears hearing aid    Past Surgical History:  Procedure Laterality Date   CATARACT EXTRACTION W/PHACO Right 05/08/2015   Procedure: CATARACT EXTRACTION PHACO AND INTRAOCULAR LENS PLACEMENT (IOC);  Surgeon: Dene Etienne, MD;  [facility];  Service: Ophthalmology;  Laterality: Right;   IR ANGIO INTRA EXTRACRAN SEL COM CAROTID INNOMINATE BILAT MOD SED  01/26/2022   IR ANGIO VERTEBRAL SEL SUBCLAVIAN INNOMINATE BILAT MOD SED  01/26/2022   PROSTATE BIOPSY     TONSILLECTOMY     Family  History  Problem Relation Age of Onset   Heart disease Father    Stroke Mother    Cancer Sister    Social History   Socioeconomic History   Marital status: Married    Spouse name: Not on file   Number of children: 1   Years of education: Not on file   Highest education level: Not on file  Occupational History   Occupation: Retired Hospital doctor: retired  Tobacco Use   Smoking status: Never   Smokeless tobacco: Never  Vaping Use   Vaping status: Never Used  Substance and Sexual Activity   Alcohol use: Yes    Alcohol/week: 14.0 standard drinks of alcohol    Types: 14 Shots of liquor per week   Drug use: No   Sexual activity: Yes  Other Topics Concern   Not on file  Social History Narrative   One biological son, one adopted son, two granddaughters, 7 & 68 yo      Has living will and HPOA- wife Elijah [name].   Would desire CPR.  Would not want prolonged life support.   Social Drivers of Corporate investment banker Strain: Low Risk  (02/18/2024)   Overall Financial Resource Strain (CARDIA)    Difficulty of Paying Living Expenses: Not hard at all  Food Insecurity: No Food Insecurity (02/18/2024)   Hunger Vital Sign    Worried About Running Out of Food in the Last Year: Never true    Ran Out of Food in the Last Year: Never true  Transportation Needs: No Transportation Needs (02/18/2024)   PRAPARE - Administrator, Civil Service (Medical): No    Lack of Transportation (Non-Medical): No  Physical Activity: Sufficiently Active (02/18/2024)   Exercise Vital Sign    Days of Exercise per Week: 5 days    Minutes of Exercise per Session: 30 min  Stress: No Stress Concern Present (02/18/2024)   [employer] of Occupational Health - Occupational Stress Questionnaire    Feeling of Stress: Not at all  Social Connections: Moderately Integrated (02/18/2024)   Social Connection and Isolation Panel    Frequency of Communication with Friends and Family: More  than three times a week    Frequency of Social Gatherings with Friends and Family: Once a week    Attends Religious Services: More than 4 times per year    Active Member of [employer] or Organizations: No    Attends Banker Meetings: Never    Marital Status: Married    Tobacco Counseling Counseling given: Not Answered    Clinical Intake:  Pre-visit preparation completed: Yes  Pain : No/denies pain     Nutritional Risks: None Diabetes: No  Lab Results  Component Value Date   HGBA1C 5.3 05/04/2022   HGBA1C 5.4 01/23/2022   HGBA1C 5.5 03/25/2020     How often do you need to have someone help you when you  read instructions, pamphlets, or other written materials from your doctor or pharmacy?: 1 - Never  Interpreter Needed?: No  Information entered by :: NAllen LPN   Activities of Daily Living     02/18/2024   12:57 PM  In your present state of health, do you have any difficulty performing the following activities:  Hearing? 1  Comment wears hearing aids  Vision? 0  Difficulty concentrating or making decisions? 1  Comment memory sometimes  Walking or climbing stairs? 0  Dressing or bathing? 0  Doing errands, shopping? 0  Preparing Food and eating ? N  Using the Toilet? N  In the past six months, have you accidently leaked urine? N  Do you have problems with loss of bowel control? N  Managing your Medications? N  Managing your Finances? N  Housekeeping or managing your Housekeeping? N    Patient Care Team: Berneta Elsie Sayre, MD as PCP - General (Family Medicine) Renda Glance, MD as Consulting Physician (Urology) Jaye Elsie, MD as Referring Physician (Ophthalmology) Isenstein, Arin L, MD (Dermatology) Francis La, DDS as Consulting Physician (Dentistry) Mittie Gaskin, MD as Referring Physician (Ophthalmology)  I have updated your Care Teams any recent Medical Services you may have received from other providers in the past  year.     Assessment:   This is a routine wellness examination for Elijah [name].  Hearing/Vision screen Hearing Screening - Comments:: Has hearing aids that are maintained Vision Screening - Comments:: Regular eye exams,    Goals Addressed             This Visit's Progress    Patient Stated       02/18/2024, stay alive       Depression Screen     02/18/2024    1:06 PM 02/12/2023    1:13 PM 11/02/2022    9:44 AM 05/04/2022   10:28 AM 02/10/2022   10:27 AM 02/10/2022   10:24 AM 02/05/2022   10:16 AM  PHQ 2/9 Scores  PHQ - 2 Score 0 0 0 0 0 0 0  PHQ- 9 Score 0 0         Fall Risk     02/18/2024    1:06 PM 02/12/2023    1:12 PM 11/02/2022    9:44 AM 05/04/2022   10:27 AM 02/10/2022   10:26 AM  Fall Risk   Falls in the past year? 0 0 0 0 0  Number falls in past yr: 0 0 0 0 0  Injury with Fall? 0 0 0 0 0  Risk for fall due to : Medication side effect Medication side effect No Fall Risks  Impaired balance/gait  Follow up Falls evaluation completed;Falls prevention discussed Falls prevention discussed;Falls evaluation completed Falls evaluation completed  Falls evaluation completed;Education provided      Data saved with a previous flowsheet row definition    MEDICARE RISK AT HOME:  Medicare Risk at Home Any stairs in or around the home?: Yes If so, are there any without handrails?: No Home free of loose throw rugs in walkways, pet beds, electrical cords, etc?: Yes Adequate lighting in your home to reduce risk of falls?: Yes Life alert?: No Use of a cane, walker or w/c?: No Grab bars in the bathroom?: Yes Shower chair or bench in shower?: Yes Elevated toilet seat or a handicapped toilet?: Yes  TIMED UP AND GO:  Was the test performed?  No  Cognitive Function: 6CIT completed    06/11/2016   10:28 AM  MMSE - Mini Mental State Exam  Orientation to time 5   Orientation to Place 5   Registration 3   Attention/ Calculation 0   Recall 3   Language- name 2 objects 0    Language- repeat 1  Language- follow 3 step command 3   Language- read & follow direction 0   Write a sentence 0   Copy design 0   Total score 20      Data saved with a previous flowsheet row definition        02/18/2024    1:07 PM 02/12/2023    1:14 PM 02/10/2022   10:29 AM  6CIT Screen  What Year? 0 points 0 points 0 points  What month? 0 points 0 points 0 points  What time? 0 points 0 points 0 points  Count back from 20 0 points 0 points 0 points  Months in reverse 0 points 0 points 0 points  Repeat phrase 0 points 2 points 0 points  Total Score 0 points 2 points 0 points    Immunizations Immunization History  Administered Date(s) Administered   Fluad Quad(high Dose 65+) 02/27/2019, 03/25/2020, 03/27/2021, 03/19/2022   Influenza, High Dose Seasonal PF 04/12/2014, 04/05/2017, 03/30/2018, 05/11/2023   Influenza-Unspecified 04/06/2016   PFIZER Comirnaty(Gray Top)Covid-19 Tri-Sucrose Vaccine 03/19/2022   PFIZER(Purple Top)SARS-COV-2 Vaccination 08/13/2019, 09/05/2019, 04/24/2020   Pneumococcal Conjugate-13 02/19/2014   Pneumococcal Polysaccharide-23 09/07/2007   Respiratory Syncytial Virus Vaccine,Recomb Aduvanted(Arexvy) 06/01/2022   Td 10/31/2008   Tdap 02/25/2022   Zoster Recombinant(Shingrix) 09/25/2020, 01/09/2021   Zoster, Live 09/07/2007    Screening Tests Health Maintenance  Topic Date Due   INFLUENZA VACCINE  01/28/2024   Medicare Annual Wellness (AWV)  02/17/2025   DTaP/Tdap/Td (3 - Td or Tdap) 02/26/2032   Pneumococcal Vaccine: 50+ Years  Completed   Zoster Vaccines- Shingrix  Completed   HPV VACCINES  Aged Out   Meningococcal B Vaccine  Aged Out   COVID-19 Vaccine  Discontinued    Health Maintenance  Health Maintenance Due  Topic Date Due   INFLUENZA VACCINE  01/28/2024   Health Maintenance Items Addressed: Due for flu vaccine.  Additional Screening:  Vision Screening: Recommended annual ophthalmology exams for early detection of glaucoma  and other disorders of the eye. Would you like a referral to an eye doctor? No    Dental Screening: Recommended annual dental exams for proper oral hygiene  Community Resource Referral / Chronic Care Management: CRR required this visit?  No   CCM required this visit?  No   Plan:    I have personally reviewed and noted the following in the patient's chart:   Medical and social history Use of alcohol, tobacco or illicit drugs  Current medications and supplements including opioid prescriptions. Patient is not currently taking opioid prescriptions. Functional ability and status Nutritional status Physical activity Advanced directives List of other physicians Hospitalizations, surgeries, and ER visits in previous 12 months Vitals Screenings to include cognitive, depression, and falls Referrals and appointments  In addition, I have reviewed and discussed with patient certain preventive protocols, quality metrics, and best practice recommendations. A written personalized care plan for preventive services as well as general preventive health recommendations were provided to patient.   Ardella FORBES Dawn, LPN   1/77/7974   After Visit Summary: (MyChart) Due to this being a telephonic visit, the after visit summary with patients personalized plan was offered to patient via MyChart   Notes: Nothing significant to report at this time.

## 2024-03-10 ENCOUNTER — Other Ambulatory Visit: Payer: Self-pay | Admitting: Family Medicine

## 2024-04-27 DIAGNOSIS — Z23 Encounter for immunization: Secondary | ICD-10-CM | POA: Diagnosis not present

## 2024-05-11 ENCOUNTER — Ambulatory Visit: Admitting: Family Medicine

## 2024-05-11 ENCOUNTER — Encounter: Payer: Self-pay | Admitting: Family Medicine

## 2024-05-11 VITALS — BP 128/62 | HR 63 | Temp 97.6°F | Ht 67.0 in | Wt 156.6 lb

## 2024-05-11 DIAGNOSIS — I10 Essential (primary) hypertension: Secondary | ICD-10-CM

## 2024-05-11 LAB — BASIC METABOLIC PANEL WITH GFR
BUN: 16 mg/dL (ref 6–23)
CO2: 26 meq/L (ref 19–32)
Calcium: 9.2 mg/dL (ref 8.4–10.5)
Chloride: 104 meq/L (ref 96–112)
Creatinine, Ser: 1.12 mg/dL (ref 0.40–1.50)
GFR: 59.09 mL/min — ABNORMAL LOW (ref >60.00)
Glucose, Bld: 95 mg/dL (ref 70–99)
Potassium: 4.2 meq/L (ref 3.5–5.1)
Sodium: 138 meq/L (ref 135–145)

## 2024-05-11 NOTE — Patient Instructions (Signed)
 Continue

## 2024-05-11 NOTE — Progress Notes (Signed)
 Established Patient Office Visit   Subjective:  Patient ID: Elijah [name], male    DOB: 1934-09-30  Age: 89 y.o. MRN: [id number]  Chief Complaint  Patient presents with   Medical Management of Chronic Issues    6 mon f/u for HTN. No specific questions or concerns     HPI Encounter Diagnoses  Name Primary?   Essential hypertension Yes   Follow-up of hypertension.  Brings in a list of BPs from home in the 120/60-70 range.  Doing well with his medications, amlodipine  10 mg and losartan 50.  Continues to exercise by walking.   Review of Systems  Constitutional: Negative.   HENT: Negative.    Eyes:  Negative for blurred vision, discharge and redness.  Respiratory: Negative.    Cardiovascular: Negative.   Gastrointestinal:  Negative for abdominal pain.  Genitourinary: Negative.   Musculoskeletal: Negative.  Negative for myalgias.  Skin:  Negative for rash.  Neurological:  Negative for tingling, loss of consciousness and weakness.  Endo/Heme/Allergies:  Negative for polydipsia.     Current Outpatient Medications:    albuterol  (VENTOLIN  HFA) 108 (90 Base) MCG/ACT inhaler, Inhale 1-2 puffs into the lungs every 4 (four) hours as needed for wheezing or shortness of breath., Disp: 18 each, Rfl: 1   amLODipine  (NORVASC ) 10 MG tablet, TAKE 1 TABLET BY MOUTH EVERY DAY, Disp: 90 tablet, Rfl: 3   cholecalciferol (VITAMIN D3) 25 MCG (1000 UNIT) tablet, Take 2,000 Units by mouth daily., Disp: , Rfl:    clopidogrel  (PLAVIX ) 75 MG tablet, TAKE 1 TABLET BY MOUTH EVERY DAY, Disp: 90 tablet, Rfl: 1   fluticasone  (FLONASE ) 50 MCG/ACT nasal spray, USE 2 SPRAYS IN EACH NOSTRIL DURING ALLERGY SEASON., Disp: 48 mL, Rfl: 2   losartan (COZAAR) 50 MG tablet, TAKE 1 TABLET BY MOUTH EVERY DAY, Disp: 90 tablet, Rfl: 2   rosuvastatin  (CRESTOR ) 20 MG tablet, TAKE 1 TABLET BY MOUTH EVERY DAY, Disp: 90 tablet, Rfl: 3   Objective:     BP (!) 152/72   Pulse 67   Temp 97.6 F (36.4 C)   Ht 5' 7 (1.702 m)    Wt 156 lb 9.6 oz (71 kg)   SpO2 97%   BMI 24.53 kg/m    Physical Exam Constitutional:      General: He is not in acute distress.    Appearance: Normal appearance. He is not ill-appearing, toxic-appearing or diaphoretic.  HENT:     Head: Normocephalic and atraumatic.     Right Ear: External ear normal.     Left Ear: External ear normal.  Eyes:     General: No scleral icterus.       Right eye: No discharge.        Left eye: No discharge.     Extraocular Movements: Extraocular movements intact.     Conjunctiva/sclera: Conjunctivae normal.  Pulmonary:     Effort: Pulmonary effort is normal. No respiratory distress.  Skin:    General: Skin is warm and dry.  Neurological:     Mental Status: He is alert and oriented to person, place, and time.  Psychiatric:        Mood and Affect: Mood normal.        Behavior: Behavior normal.      No results found for any visits on 05/11/24.    The ASCVD Risk score (Arnett DK, et al., 2019) failed to calculate for the following reasons:   The 2019 ASCVD risk score is only valid for ages  40 to 79   Risk score cannot be calculated because patient has a medical history suggesting prior/existing ASCVD    Assessment & Plan:   Essential hypertension -     Basic metabolic panel with GFR    Return in about 3 months (around 08/11/2024), or Continue current medications.  Check and record blood pressures periodically.SABRA Elsie Sim Berneta, MD

## 2024-05-18 DIAGNOSIS — C44729 Squamous cell carcinoma of skin of left lower limb, including hip: Secondary | ICD-10-CM | POA: Diagnosis not present

## 2024-05-18 DIAGNOSIS — R208 Other disturbances of skin sensation: Secondary | ICD-10-CM | POA: Diagnosis not present

## 2024-05-18 DIAGNOSIS — D485 Neoplasm of uncertain behavior of skin: Secondary | ICD-10-CM | POA: Diagnosis not present

## 2024-06-09 ENCOUNTER — Other Ambulatory Visit: Payer: Self-pay | Admitting: Family Medicine

## 2024-06-09 DIAGNOSIS — Z8673 Personal history of transient ischemic attack (TIA), and cerebral infarction without residual deficits: Secondary | ICD-10-CM

## 2024-08-02 ENCOUNTER — Other Ambulatory Visit: Payer: Self-pay | Admitting: Family Medicine

## 2024-08-02 DIAGNOSIS — I10 Essential (primary) hypertension: Secondary | ICD-10-CM

## 2024-08-14 ENCOUNTER — Ambulatory Visit: Admitting: Family Medicine

## 2024-08-31 ENCOUNTER — Ambulatory Visit: Admitting: Neurology

## 2025-02-23 ENCOUNTER — Ambulatory Visit
